# Patient Record
Sex: Male | Born: 1945 | Race: Black or African American | Hispanic: No | Marital: Married | Smoking: Never smoker
Health system: Southern US, Community
[De-identification: ages and names within clinical notes are randomized; demographics above are authoritative.]

## PROBLEM LIST (undated history)

## (undated) DIAGNOSIS — E7800 Pure hypercholesterolemia, unspecified: Secondary | ICD-10-CM

## (undated) DIAGNOSIS — N184 Chronic kidney disease, stage 4 (severe): Secondary | ICD-10-CM

## (undated) DIAGNOSIS — D649 Anemia, unspecified: Secondary | ICD-10-CM

## (undated) DIAGNOSIS — I10 Essential (primary) hypertension: Secondary | ICD-10-CM

## (undated) DIAGNOSIS — K219 Gastro-esophageal reflux disease without esophagitis: Secondary | ICD-10-CM

## (undated) DIAGNOSIS — E119 Type 2 diabetes mellitus without complications: Secondary | ICD-10-CM

## (undated) HISTORY — PX: HERNIA REPAIR: SHX51

---

## 2004-04-11 ENCOUNTER — Ambulatory Visit (HOSPITAL_COMMUNITY): Admission: RE | Admit: 2004-04-11 | Discharge: 2004-04-11 | Payer: Self-pay | Admitting: General Surgery

## 2004-07-11 ENCOUNTER — Ambulatory Visit (HOSPITAL_COMMUNITY): Admission: RE | Admit: 2004-07-11 | Discharge: 2004-07-11 | Payer: Self-pay | Admitting: Family Medicine

## 2004-07-21 ENCOUNTER — Ambulatory Visit (HOSPITAL_COMMUNITY): Admission: RE | Admit: 2004-07-21 | Discharge: 2004-07-21 | Payer: Self-pay | Admitting: Family Medicine

## 2006-07-31 ENCOUNTER — Ambulatory Visit (HOSPITAL_COMMUNITY): Admission: RE | Admit: 2006-07-31 | Discharge: 2006-07-31 | Payer: Self-pay | Admitting: Family Medicine

## 2008-03-23 ENCOUNTER — Emergency Department (HOSPITAL_COMMUNITY): Admission: EM | Admit: 2008-03-23 | Discharge: 2008-03-23 | Payer: Self-pay | Admitting: Emergency Medicine

## 2011-03-17 NOTE — H&P (Signed)
NAMESAISH, Omar Parker                              ACCOUNT NO.:  [id number]   MEDICAL RECORD NO.:  [id number]                  PATIENT TYPE:   LOCATION:                                       FACILITY:   PHYSICIAN:  Jamesetta So, M.D.               DATE OF BIRTH:  [birth year]   DATE OF ADMISSION:  DATE OF DISCHARGE:                                HISTORY & PHYSICAL   CHIEF COMPLAINT:  Recurrent left inguinal hernia.   HISTORY OF PRESENT ILLNESS:  The patient is a 65 year old black male who is  referred for evaluation and treatment of a recurrent left inguinal hernia.  He had bilateral inguinal herniorrhaphies in the remote past.  He had a left  groin mass start some time ago, but is increasing in size and is causing  discomfort.  No nausea or vomiting had been noted.   PAST MEDICAL HISTORY:  1. Non-insulin-dependent diabetes mellitus.  2. Hypertension.   PAST SURGICAL HISTORY:  As noted above.   CURRENT MEDICATIONS:  Norvasc and Glucophage.   ALLERGIES:  No known drug allergies.   REVIEW OF SYSTEMS:  The patient denies drinking or smoking.  He denies any  other cardiopulmonary difficulties or bleeding disorders.   PHYSICAL EXAMINATION:  GENERAL:  The patient is a well-developed, well-  nourished black male in no acute distress.  VITAL SIGNS:  He is afebrile and vital signs are stable.  LUNGS:  Clear to auscultation with equal breath sounds bilaterally.  HEART:  Regular rate and rhythm without S3, S4 or murmurs.  ABDOMEN:  Soft, nontender and nondistended.  No hepatosplenomegaly or masses  are noted.  A large left inguinal hernia is noted extending into the  scrotum, which was somewhat reducible.  GENITOURINARY:  Within normal limits.   IMPRESSION:  Recurrent left inguinal hernia.   PLAN:  The patient is scheduled for a recurrent left inguinal herniorrhaphy  on April 11, 2004.  The risks and benefits of the procedure including  bleeding, infection, swelling, numbness and the  possibility of recurrence of  the hernia were fully explained to the patient; gave informed consent.     ___________________________________________                                         Jamesetta So, M.D.   MAJ/MEDQ  D:  03/31/2004  T:  03/31/2004  Job:  [id number]   cc:   Jamesetta So, M.D.  [address] [place]  [place] [address]  Fax: [phone]   Angus G. Everette Rank, M.D.  [address]  [place]  [place] [address]  Fax: [phone]

## 2011-03-17 NOTE — Op Note (Signed)
NAME:  Omar Parker, Omar Parker                            ACCOUNT NO.:  [id number]   MEDICAL RECORD NO.:  [id number]                   PATIENT TYPE:  AMB   LOCATION:  DAY                                  FACILITY:  [facility]   PHYSICIAN:  Jamesetta So, M.D.               DATE OF BIRTH:  1946-04-05   DATE OF PROCEDURE:  04/11/2004  DATE OF DISCHARGE:                                 OPERATIVE REPORT   PREOPERATIVE DIAGNOSIS:  Recurrent left inguinal hernia.   POSTOPERATIVE DIAGNOSIS:  Recurrent left inguinal hernia with hydrocele.   PROCEDURE:  Left inguinal herniorrhaphy with hydrocelectomy, recurrent.   SURGEON:  Jamesetta So, M.D.   ANESTHESIA:  General endotracheal.   INDICATIONS:  The patient is a 65 year old black male who is referred for  evaluation and treatment of a recurrent left inguinal hernia.  He has had  bilateral inguinal herniorrhaphies in the remote past.  The risks and  benefits of the procedure including bleeding, infection, pain, and  recurrence of the hernia were fully explained to the patient, who gave  informed consent.   DESCRIPTION OF PROCEDURE:  The patient was placed in the supine position.  After induction of general anesthesia, the left groin region was prepped and  draped using the usual sterile technique with Betadine.  Surgical site  confirmation was performed.   An left groin incision was made down to the hernia sac. The patient had a  large hernia which could not be fully reduced.  The spermatocord was freed  away from the hernia sac.  The vas deferens was never fully identified.  I  suspect that it may have been divided in the past.  The hernia sac was  incised at its peritoneal reflection and omentum was found.  Due to the  inability to fully reduce the omentum that was out, an LDS stapler was used  to do a partial omentectomy.  The omentum then was reduced back into the  abdominal cavity.  It appeared that the hernia was just lateral to the  spermatocord. It was through the previous mesh that had been placed.  It  appeared that the lateral aspect of the internal ring where the mesh was had  opened.  The mesh was reattached using 2-0 Novofil interrupted sutures in a  2-layer fashion in order to recreate the internal ring.   Next, the left testicle was brought out through the wound and a left  hydrocelectomy was performed.  The left testicle was then reduced back into  the scrotum.  The external oblique was not noted to be significantly intact.  The remaining subcutaneous tissue was reapproximated using 2-0 Novofil  interrupted sutures.   Sensorcaine 0.5% was instilled into the surrounding wound.  The incision was  closed with staples.  Betadine ointment and dry sterile dressing were  applied.   All tape and needle counts were correct at the  end of the procedure.  The  patient was extubated in the operating room and went back to recovery room  awake, in stable condition.   COMPLICATIONS:  None.   SPECIMENS:  Left inguinal hernia sac, hydrocele.   BLOOD LOSS:  Minimal.      ___________________________________________                                            Jamesetta So, M.D.   MAJ/MEDQ  D:  04/11/2004  T:  04/11/2004  Job:  [id number]   cc:   Jamesetta So, M.D.  [address], [place]  [place] [address]  Fax: [phone]   Angus G. Everette Rank, M.D.  [address]  [place]  [place] [address]  Fax: [phone]

## 2011-07-26 LAB — CBC
HCT: 40.3
Hemoglobin: 13.6
MCHC: 33.8
MCV: 79.5
Platelets: 347
RBC: 5.07
RDW: 15
WBC: 6.8

## 2011-07-26 LAB — BASIC METABOLIC PANEL
BUN: 12
CO2: 24
Chloride: 105
Creatinine, Ser: 1.16
GFR calc Af Amer: >60

## 2011-07-26 LAB — BASIC METABOLIC PANEL WITH GFR
Calcium: 9
GFR calc non Af Amer: >60
Glucose, Bld: 158 — ABNORMAL HIGH
Potassium: 3.8
Sodium: 136

## 2011-07-26 LAB — DIFFERENTIAL
Basophils Absolute: 0.1
Basophils Relative: 1
Eosinophils Absolute: 0.1
Eosinophils Relative: 2
Lymphocytes Relative: 24
Lymphs Abs: 1.6
Monocytes Absolute: 0.5
Monocytes Relative: 7
Neutro Abs: 4.5
Neutrophils Relative %: 67

## 2011-07-26 LAB — POCT CARDIAC MARKERS
CKMB, poc: 2.2
Myoglobin, poc: 83.1
Operator id: 221061
Troponin i, poc: <0.05

## 2013-06-14 ENCOUNTER — Emergency Department (HOSPITAL_COMMUNITY)
Admission: EM | Admit: 2013-06-14 | Discharge: 2013-06-14 | Disposition: A | Discharge status: Home / Self Care | Payer: BC Managed Care – PPO | Attending: Emergency Medicine | Admitting: Emergency Medicine

## 2013-06-14 ENCOUNTER — Encounter (HOSPITAL_COMMUNITY): Payer: Self-pay | Admitting: *Deleted

## 2013-06-14 DIAGNOSIS — E1169 Type 2 diabetes mellitus with other specified complication: Secondary | ICD-10-CM | POA: Insufficient documentation

## 2013-06-14 DIAGNOSIS — R739 Hyperglycemia, unspecified: Secondary | ICD-10-CM

## 2013-06-14 DIAGNOSIS — R3589 Other polyuria: Secondary | ICD-10-CM | POA: Insufficient documentation

## 2013-06-14 DIAGNOSIS — R358 Other polyuria: Secondary | ICD-10-CM | POA: Insufficient documentation

## 2013-06-14 DIAGNOSIS — Z79899 Other long term (current) drug therapy: Secondary | ICD-10-CM | POA: Insufficient documentation

## 2013-06-14 DIAGNOSIS — E7800 Pure hypercholesterolemia, unspecified: Secondary | ICD-10-CM | POA: Insufficient documentation

## 2013-06-14 DIAGNOSIS — Z794 Long term (current) use of insulin: Secondary | ICD-10-CM | POA: Insufficient documentation

## 2013-06-14 DIAGNOSIS — I10 Essential (primary) hypertension: Secondary | ICD-10-CM | POA: Insufficient documentation

## 2013-06-14 DIAGNOSIS — R631 Polydipsia: Secondary | ICD-10-CM | POA: Insufficient documentation

## 2013-06-14 DIAGNOSIS — Z7982 Long term (current) use of aspirin: Secondary | ICD-10-CM | POA: Insufficient documentation

## 2013-06-14 DIAGNOSIS — R350 Frequency of micturition: Secondary | ICD-10-CM | POA: Insufficient documentation

## 2013-06-14 DIAGNOSIS — R42 Dizziness and giddiness: Secondary | ICD-10-CM | POA: Insufficient documentation

## 2013-06-14 HISTORY — DX: Essential (primary) hypertension: I10

## 2013-06-14 HISTORY — DX: Pure hypercholesterolemia, unspecified: E78.00

## 2013-06-14 HISTORY — DX: Type 2 diabetes mellitus without complications: E11.9

## 2013-06-14 LAB — GLUCOSE, CAPILLARY: Glucose-Capillary: 292 mg/dL — ABNORMAL HIGH (ref 70–99)

## 2013-06-14 LAB — CBC
HCT: 37.9 % — ABNORMAL LOW (ref 39.0–52.0)
Hemoglobin: 12.9 g/dL — ABNORMAL LOW (ref 13.0–17.0)
MCH: 26.4 pg (ref 26.0–34.0)
MCV: 77.7 fL — ABNORMAL LOW (ref 78.0–100.0)
RBC: 4.88 MIL/uL (ref 4.22–5.81)

## 2013-06-14 LAB — BASIC METABOLIC PANEL
BUN: 29 mg/dL — ABNORMAL HIGH (ref 6–23)
CO2: 23 mEq/L (ref 19–32)
Calcium: 9.3 mg/dL (ref 8.4–10.5)
Creatinine, Ser: 1.84 mg/dL — ABNORMAL HIGH (ref 0.50–1.35)
Glucose, Bld: 409 mg/dL — ABNORMAL HIGH (ref 70–99)

## 2013-06-14 MED ORDER — INSULIN ASPART 100 UNIT/ML ~~LOC~~ SOLN
10.0000 [IU] | Freq: Once | SUBCUTANEOUS | Status: AC
  Administered 2013-06-14: 10 [IU] via INTRAVENOUS
  Filled 2013-06-14: qty 1

## 2013-06-14 MED ORDER — SODIUM CHLORIDE 0.9 % IV SOLN
Freq: Once | INTRAVENOUS | Status: AC
  Administered 2013-06-14: 999 mL via INTRAVENOUS

## 2013-06-14 MED ORDER — SODIUM CHLORIDE 0.9 % IV SOLN
INTRAVENOUS | Status: DC
  Administered 2013-06-14: 1000 mL via INTRAVENOUS

## 2013-06-14 NOTE — ED Notes (Signed)
C/o increased fatigue and being thirsty x 2 days.  Denies weakness, denies pain.

## 2013-06-14 NOTE — ED Provider Notes (Signed)
CSN: [id number]     Arrival date & time 06/14/13  1000 History    This chart was scribed for Omar Kung, MD,  by Stacy Gardner, ED Scribe. The patient was seen in room APA02/[facility]02 and the patient's care was started at 11:54 AM  First MD Initiated Contact with Patient 06/14/13 1109     Chief Complaint  Patient presents with  . Fatigue   (Consider location/radiation/quality/duration/timing/severity/associated sxs/prior Treatment) HPI HPI Comments: Omar Parker is a 67 y.o. male who presents to the Emergency Department complaining of fatigue. Pt mention feeling dizzy and generalized weakness as associated symptoms. Pt also reports polyuria and polydipsia over the past two days. Pt had increased blood sugar level in 400's. He was given fluids in the ED and the symptoms have mostly resolved.  Pt denies having a shortage of medications. Pt mentions that he takes his insulin around 5 AM however he reports that he attributes symptoms to poor dieting.   However he denies vomiting, focal weakness and pain.   Pt has a hx of diabetes, HTN, high cholesterol Pt PCP is Dr. Orson Ape but also seen at [facility].  Past Medical History  Diagnosis Date  . Diabetes mellitus without complication   . Hypertension   . High cholesterol    Past Surgical History  Procedure Laterality Date  . Hernia repair     No family history on file. History  Substance Use Topics  . Smoking status: Never Smoker   . Smokeless tobacco: Not on file  . Alcohol Use: No    Review of Systems  Constitutional: Positive for fatigue. Negative for fever and chills.  HENT: Negative for congestion, sore throat, rhinorrhea and neck pain.   Respiratory: Negative for cough and shortness of breath.   Cardiovascular: Negative for chest pain.  Gastrointestinal: Negative for nausea, vomiting, abdominal pain and diarrhea.  Endocrine: Positive for polydipsia and polyuria.  Genitourinary: Positive for frequency.  Musculoskeletal:  Negative for back pain and joint swelling.  Skin: Negative for rash.  Neurological: Positive for dizziness and weakness. Negative for headaches.  Hematological: Does not bruise/bleed easily.  Psychiatric/Behavioral: Negative for confusion.  All other systems reviewed and are negative.    Allergies  Review of patient's allergies indicates no known allergies.  Home Medications   Current Outpatient Rx  Name  Route  Sig  Dispense  Refill  . aspirin 81 MG chewable tablet   Oral   Chew 81 mg by mouth daily.         Marland Kitchen glyBURIDE (DIABETA) 5 MG tablet   Oral   Take 10 mg by mouth 2 (two) times daily with a meal.         . insulin glargine (LANTUS) 100 UNIT/ML injection   Subcutaneous   Inject 50 Units into the skin daily.         Marland Kitchen lisinopril-hydrochlorothiazide (PRINZIDE,ZESTORETIC) 20-12.5 MG per tablet   Oral   Take 2 tablets by mouth daily.         . pravastatin (PRAVACHOL) 20 MG tablet   Oral   Take 10 mg by mouth daily.         . sildenafil (VIAGRA) 100 MG tablet   Oral   Take 100 mg by mouth daily as needed for erectile dysfunction.          BP 134/78  Pulse 99  Temp(Src) 98.2 F (36.8 C) (Oral)  Resp 16  Ht 5\' 11"  (1.803 m)  Wt 190 lb (86.183 kg)  BMI 26.51 kg/m2  SpO2 100% Physical Exam  Nursing note and vitals reviewed. Constitutional: He is oriented to person, place, and time. He appears well-developed and well-nourished. No distress.  HENT:  Head: Normocephalic and atraumatic.  Mouth/Throat: Oropharynx is clear and moist.  Mucus membrane moist   Eyes: Conjunctivae are normal. Pupils are equal, round, and reactive to light. No scleral icterus.  Neck: Neck supple.  Cardiovascular: Normal rate, regular rhythm, normal heart sounds and intact distal pulses.   No murmur heard. Pulmonary/Chest: Effort normal and breath sounds normal. No stridor. No respiratory distress. He has no wheezes. He has no rales.  Abdominal: Soft. He exhibits no  distension. There is no tenderness. There is no rebound.  Musculoskeletal: Normal range of motion. He exhibits no edema.  No ankle swelling  Neurological: He is alert and oriented to person, place, and time. No cranial nerve deficit. Coordination normal.  Skin: Skin is warm and dry. No rash noted.  Psychiatric: He has a normal mood and affect. His behavior is normal.    ED Course  DIAGNOSTIC STUDIES: Oxygen Saturation is 100% on room air, normal by my interpretation.    COORDINATION OF CARE: 11:59 Discussed course of care with pt . Pt understands and agrees.   Procedures (including critical care time)  Labs Reviewed  CBC - Abnormal; Notable for the following:    Hemoglobin 12.9 (*)    HCT 37.9 (*)    MCV 77.7 (*)    All other components within normal limits  BASIC METABOLIC PANEL - Abnormal; Notable for the following:    Sodium 131 (*)    Glucose, Bld 409 (*)    BUN 29 (*)    Creatinine, Ser 1.84 (*)    GFR calc non Af Amer 37 (*)    GFR calc Af Amer 43 (*)    All other components within normal limits  GLUCOSE, CAPILLARY - Abnormal; Notable for the following:    Glucose-Capillary 371 (*)    All other components within normal limits  GLUCOSE, CAPILLARY - Abnormal; Notable for the following:    Glucose-Capillary 367 (*)    All other components within normal limits  GLUCOSE, CAPILLARY - Abnormal; Notable for the following:    Glucose-Capillary 292 (*)    All other components within normal limits   No results found.  Results for orders placed during the hospital encounter of 06/14/13  CBC      Result Value Range   WBC 6.6  4.0 - 10.5 K/uL   RBC 4.88  4.22 - 5.81 MIL/uL   Hemoglobin 12.9 (*) 13.0 - 17.0 g/dL   HCT 37.9 (*) 39.0 - 52.0 %   MCV 77.7 (*) 78.0 - 100.0 fL   MCH 26.4  26.0 - 34.0 pg   MCHC 34.0  30.0 - 36.0 g/dL   RDW 13.7  11.5 - 15.5 %   Platelets 280  150 - 400 K/uL  BASIC METABOLIC PANEL      Result Value Range   Sodium 131 (*) 135 - 145 mEq/L    Potassium 3.6  3.5 - 5.1 mEq/L   Chloride 96  96 - 112 mEq/L   CO2 23  19 - 32 mEq/L   Glucose, Bld 409 (*) 70 - 99 mg/dL   BUN 29 (*) 6 - 23 mg/dL   Creatinine, Ser 1.84 (*) 0.50 - 1.35 mg/dL   Calcium 9.3  8.4 - 10.5 mg/dL   GFR calc non Af Amer 37 (*) >90  mL/min   GFR calc Af Amer 43 (*) >90 mL/min  GLUCOSE, CAPILLARY      Result Value Range   Glucose-Capillary 371 (*) 70 - 99 mg/dL   Comment 1 Documented in Chart    GLUCOSE, CAPILLARY      Result Value Range   Glucose-Capillary 367 (*) 70 - 99 mg/dL   Comment 1 Documented in Chart    GLUCOSE, CAPILLARY      Result Value Range   Glucose-Capillary 292 (*) 70 - 99 mg/dL    1. Hyperglycemia     MDM   Patient with known history of diabetes patient has been taking his diabetic meds. He does admit these didn't very poor with his diet. Patient said increased thirst increased urination for several days probably due to elevated blood sugars. No evidence of acidosis. Patient is nontoxic no acute distress. Fluids help to improve the blood sugar gave also 10 units of regular insulin which improved to down below 300. Patient will go back with a more careful diet and followup with his regular Dr.   I personally performed the services described in this documentation, which was scribed in my presence. The recorded information has been reviewed and is accurate.     Omar Kung, MD 06/14/13 [phone]

## 2015-02-20 ENCOUNTER — Encounter (HOSPITAL_COMMUNITY): Payer: Self-pay | Admitting: Emergency Medicine

## 2015-02-20 ENCOUNTER — Emergency Department (HOSPITAL_COMMUNITY): Payer: BLUE CROSS/BLUE SHIELD

## 2015-02-20 ENCOUNTER — Emergency Department (HOSPITAL_COMMUNITY)
Admission: EM | Admit: 2015-02-20 | Discharge: 2015-02-20 | Disposition: A | Discharge status: Home / Self Care | Payer: BLUE CROSS/BLUE SHIELD | Attending: Emergency Medicine | Admitting: Emergency Medicine

## 2015-02-20 DIAGNOSIS — Z7982 Long term (current) use of aspirin: Secondary | ICD-10-CM | POA: Insufficient documentation

## 2015-02-20 DIAGNOSIS — E119 Type 2 diabetes mellitus without complications: Secondary | ICD-10-CM | POA: Diagnosis not present

## 2015-02-20 DIAGNOSIS — I10 Essential (primary) hypertension: Secondary | ICD-10-CM | POA: Insufficient documentation

## 2015-02-20 DIAGNOSIS — E780 Pure hypercholesterolemia: Secondary | ICD-10-CM | POA: Insufficient documentation

## 2015-02-20 DIAGNOSIS — R609 Edema, unspecified: Secondary | ICD-10-CM

## 2015-02-20 DIAGNOSIS — Z794 Long term (current) use of insulin: Secondary | ICD-10-CM | POA: Diagnosis not present

## 2015-02-20 DIAGNOSIS — M79672 Pain in left foot: Secondary | ICD-10-CM | POA: Diagnosis present

## 2015-02-20 DIAGNOSIS — R590 Localized enlarged lymph nodes: Secondary | ICD-10-CM | POA: Diagnosis not present

## 2015-02-20 DIAGNOSIS — Z79899 Other long term (current) drug therapy: Secondary | ICD-10-CM | POA: Insufficient documentation

## 2015-02-20 DIAGNOSIS — M7989 Other specified soft tissue disorders: Secondary | ICD-10-CM

## 2015-02-20 LAB — CBC
HEMATOCRIT: 39.6 % (ref 39.0–52.0)
Hemoglobin: 12.8 g/dL — ABNORMAL LOW (ref 13.0–17.0)
MCH: 25.2 pg — AB (ref 26.0–34.0)
MCHC: 32.3 g/dL (ref 30.0–36.0)
MCV: 78.1 fL (ref 78.0–100.0)
Platelets: 274 10*3/uL (ref 150–400)
RBC: 5.07 MIL/uL (ref 4.22–5.81)
RDW: 15 % (ref 11.5–15.5)
WBC: 7.2 10*3/uL (ref 4.0–10.5)

## 2015-02-20 LAB — I-STAT CHEM 8, ED
BUN: 29 mg/dL — AB (ref 6–23)
Calcium, Ion: 1.14 mmol/L (ref 1.13–1.30)
Chloride: 106 mmol/L (ref 96–112)
Creatinine, Ser: 1.8 mg/dL — ABNORMAL HIGH (ref 0.50–1.35)
Glucose, Bld: 118 mg/dL — ABNORMAL HIGH (ref 70–99)
HEMATOCRIT: 43 % (ref 39.0–52.0)
Hemoglobin: 14.6 g/dL (ref 13.0–17.0)
Potassium: 4 mmol/L (ref 3.5–5.1)
Sodium: 139 mmol/L (ref 135–145)
TCO2: 20 mmol/L (ref 0–100)

## 2015-02-20 MED ORDER — NAPROXEN 500 MG PO TABS: 500.0000 mg | ORAL_TABLET | Freq: Two times a day (BID) | ORAL | Status: DC

## 2015-02-20 MED ORDER — IOHEXOL 300 MG/ML  SOLN
50.0000 mL | Freq: Once | INTRAMUSCULAR | Status: AC | PRN
  Administered 2015-02-20: 50 mL via ORAL

## 2015-02-20 MED ORDER — IOHEXOL 300 MG/ML  SOLN
80.0000 mL | Freq: Once | INTRAMUSCULAR | Status: AC | PRN
  Administered 2015-02-20: 80 mL via INTRAVENOUS

## 2015-02-20 NOTE — ED Notes (Signed)
US at bedside

## 2015-02-20 NOTE — Discharge Instructions (Signed)
Edema °Edema is an abnormal buildup of fluids in your body tissues. Edema is somewhat dependent on gravity to pull the fluid to the lowest place in your body. That makes the condition more common in the legs and thighs (lower extremities). Painless swelling of the feet and ankles is common and becomes more likely as you get older. It is also common in looser tissues, like around your eyes.  °When the affected area is squeezed, the fluid may move out of that spot and leave a dent for a few moments. This dent is called pitting.  °CAUSES  °There are many possible causes of edema. Eating too much salt and being on your feet or sitting for a long time can cause edema in your legs and ankles. Hot weather may make edema worse. Common medical causes of edema include: °· Heart failure. °· Liver disease. °· Kidney disease. °· Weak blood vessels in your legs. °· Cancer. °· An injury. °· Pregnancy. °· Some medications. °· Obesity.  °SYMPTOMS  °Edema is usually painless. Your skin may look swollen or shiny.  °DIAGNOSIS  °Your health care provider may be able to diagnose edema by asking about your medical history and doing a physical exam. You may need to have tests such as X-rays, an electrocardiogram, or blood tests to check for medical conditions that may cause edema.  °TREATMENT  °Edema treatment depends on the cause. If you have heart, liver, or kidney disease, you need the treatment appropriate for these conditions. General treatment may include: °· Elevation of the affected body part above the level of your heart. °· Compression of the affected body part. Pressure from elastic bandages or support stockings squeezes the tissues and forces fluid back into the blood vessels. This keeps fluid from entering the tissues. °· Restriction of fluid and salt intake. °· Use of a water pill (diuretic). These medications are appropriate only for some types of edema. They pull fluid out of your body and make you urinate more often. This  gets rid of fluid and reduces swelling, but diuretics can have side effects. Only use diuretics as directed by your health care provider. °HOME CARE INSTRUCTIONS  °· Keep the affected body part above the level of your heart when you are lying down.   °· Do not sit still or stand for prolonged periods.   °· Do not put anything directly under your knees when lying down. °· Do not wear constricting clothing or garters on your upper legs.   °· Exercise your legs to work the fluid back into your blood vessels. This may help the swelling go down.   °· Wear elastic bandages or support stockings to reduce ankle swelling as directed by your health care provider.   °· Eat a low-salt diet to reduce fluid if your health care provider recommends it.   °· Only take medicines as directed by your health care provider.  °SEEK MEDICAL CARE IF:  °· Your edema is not responding to treatment. °· You have heart, liver, or kidney disease and notice symptoms of edema. °· You have edema in your legs that does not improve after elevating them.   °· You have sudden and unexplained weight gain. °SEEK IMMEDIATE MEDICAL CARE IF:  °· You develop shortness of breath or chest pain.   °· You cannot breathe when you lie down. °· You develop pain, redness, or warmth in the swollen areas.   °· You have heart, liver, or kidney disease and suddenly get edema. °· You have a fever and your symptoms suddenly get worse. °MAKE SURE YOU:  °·   Understand these instructions.  Will watch your condition.  Will get help right away if you are not doing well or get worse. Document Released: 10/16/2005 Document Revised: 03/02/2014 Document Reviewed: 08/08/2013 [facility] Patient Information 2015 [place], [place]. This information is not intended to replace advice given to you by your health care provider. Make sure you discuss any questions you have with your health care provider.   Keep your foot elevated as much as possible - if it continues to swell, you will  likely need to be seen for recheck and may need compression stockings.    You MUST have a recheck - you will need to get further testing on the lymph node in your groin - they may need to do a biopsy.  [facility] Primary Care Doctor List    Sinda Du MD. Specialty: Pulmonary Disease Contact information: [place]  [place] [address]  [phone]   Tula Nakayama, MD. Specialty: [facility] Medicine Contact information: [address], [address] [place] [address]  [phone]   Sallee Lange, MD. Specialty: [facility] Medicine Contact information: [place]  [place] [address]  [phone]   Rosita Fire, MD Specialty: Internal Medicine Contact information: [place] [address]  [phone]   Delphina Cahill, MD. Specialty: Internal Medicine Contact information: [place] [address]  [phone]   Marjean Donna, MD. Specialty: Family Medicine Contact information: [place] [address]  [phone]   Leslie Andrea, MD. Specialty: [facility] Medicine Contact information: [place] [address]  [phone]   Asencion Noble, MD. Specialty: Internal Medicine Contact information: [place]  [place] [address]  [phone]

## 2015-02-20 NOTE — ED Provider Notes (Signed)
CSN: [id number]     Arrival date & time 02/20/15  1011 History   First MD Initiated Contact with Patient 02/20/15 1017     Chief Complaint  Patient presents with  . Foot Pain     (Consider location/radiation/quality/duration/timing/severity/associated sxs/prior Treatment) Patient is a 69 y.o. male presenting with lower extremity pain. The history is provided by the patient. No language interpreter was used.  Foot Pain Pertinent negatives include no chest pain and no shortness of breath.   This chart was scribed for Noemi Chapel, MD by Thea Alken, ED Scribe. This patient was seen in room APA18/APA18 and the patient's care was started at 3:28 PM.  HPI Comments:  Omar Parker is a 69 y.o. male who present to the Emergency Department complaining of left leg swelling that began yesterday., has been persistsent, mild.  Pt denies injuries and falls. He reports feeling pressure in foot when walking due to swelling. Pt has hx HTN and DM which he states is under control. Pt denies fever, SOB and CP. He denies hx of blood clot, surgeries, and recent travels.  He denies swelling in left leg in the past. No risk factors for  PE.  Past Medical History  Diagnosis Date  . Diabetes mellitus without complication   . Hypertension   . High cholesterol    Past Surgical History  Procedure Laterality Date  . Hernia repair     No family history on file. History  Substance Use Topics  . Smoking status: Never Smoker   . Smokeless tobacco: Not on file  . Alcohol Use: No    Review of Systems  Constitutional: Negative for fever.  Respiratory: Negative for shortness of breath.   Cardiovascular: Positive for leg swelling. Negative for chest pain.  All other systems reviewed and are negative.     Allergies  Review of patient's allergies indicates no known allergies.  Home Medications   Prior to Admission medications   Medication Sig Start Date End Date Taking? Authorizing Provider  amLODipine  (NORVASC) 10 MG tablet Take 10 mg by mouth daily.   Yes Historical Provider, MD  amLODipine (NORVASC) 5 MG tablet Take 5 mg by mouth daily.   Yes Historical Provider, MD  aspirin 81 MG chewable tablet Chew 81 mg by mouth daily.   Yes Historical Provider, MD  glipiZIDE (GLUCOTROL) 10 MG tablet Take 10 mg by mouth daily before breakfast.   Yes Historical Provider, MD  hydrochlorothiazide (HYDRODIURIL) 25 MG tablet Take 25 mg by mouth daily.   Yes Historical Provider, MD  insulin glargine (LANTUS) 100 UNIT/ML injection Inject 50 Units into the skin daily.   Yes Historical Provider, MD  lisinopril (PRINIVIL,ZESTRIL) 10 MG tablet Take 10 mg by mouth daily.   Yes Historical Provider, MD  pravastatin (PRAVACHOL) 20 MG tablet Take 10 mg by mouth daily.   Yes Historical Provider, MD  sildenafil (VIAGRA) 100 MG tablet Take 100 mg by mouth daily as needed for erectile dysfunction.   Yes Historical Provider, MD  naproxen (NAPROSYN) 500 MG tablet Take 1 tablet (500 mg total) by mouth 2 (two) times daily with a meal. 02/20/15   Noemi Chapel, MD   BP 141/80 mmHg  Pulse 75  Temp(Src) 97.5 F (36.4 C) (Oral)  Resp 16  Ht 5\' 11"  (1.803 m)  Wt 197 lb (89.359 kg)  BMI 27.49 kg/m2  SpO2 100% Physical Exam  Constitutional: He appears well-developed and well-nourished.  HENT:  Head: Normocephalic and atraumatic.  Eyes: Conjunctivae  are normal. Right eye exhibits no discharge. Left eye exhibits no discharge.  Cardiovascular: Normal rate, regular rhythm and normal heart sounds.  Exam reveals no gallop and no friction rub.   No murmur heard. Pulmonary/Chest: Effort normal and breath sounds normal. No respiratory distress. He has no wheezes. He has no rales. He exhibits no tenderness.  Musculoskeletal: He exhibits edema ( from knee to foot pedal edema uni lateral to the foot. ).  Lateral left leg swelling knee to foot.  No tenderness with ROM of joint. Soft compartments. Negative Holmans sign.   Neurological: He  is alert. Coordination normal.  Skin: Skin is warm and dry. No rash noted. He is not diaphoretic. No erythema.  Psychiatric: He has a normal mood and affect.  Nursing note and vitals reviewed.   ED Course  Procedures (including critical care time) Labs Review Labs Reviewed  CBC - Abnormal; Notable for the following:    Hemoglobin 12.8 (*)    MCH 25.2 (*)    All other components within normal limits  I-STAT CHEM 8, ED - Abnormal; Notable for the following:    BUN 29 (*)    Creatinine, Ser 1.80 (*)    Glucose, Bld 118 (*)    All other components within normal limits    Imaging Review Ct Abdomen Pelvis W Contrast  02/20/2015   CLINICAL DATA:  LL leg swelling since yesterday. Left inguinal lymph node seen on ultrasound today. Pt is diabetic,HTN, hernia repair.  EXAM: CT ABDOMEN AND PELVIS WITH CONTRAST  TECHNIQUE: Multidetector CT imaging of the abdomen and pelvis was performed using the standard protocol following bolus administration of intravenous contrast.  CONTRAST:  20mL OMNIPAQUE IOHEXOL 300 MG/ML SOLN, 74mL OMNIPAQUE IOHEXOL 300 MG/ML SOLN  COMPARISON:  None.  FINDINGS: Clear lung bases.  Heart normal in size.  Liver, spleen, gallbladder, pancreas, adrenal glands:  Normal.  13 mm upper pole cyst from the right kidney. 15 mm renal sinus cyst on the left at the midpole. No other renal abnormalities. No hydronephrosis. Normal ureters. Normal bladder.  No pathologically enlarged lymph nodes. No abnormal fluid collections.  Numerous colonic diverticula mostly along the sigmoid colon. No diverticulitis. No bowel wall thickening or mesenteric inflammation. Normal small bowel. Normal appendix.  Mild disc degenerative changes at L2-L3. No osteoblastic or osteolytic lesions.  Mild scattered atherosclerotic plaque in the abdominal aorta and iliac arteries. Inferior vena cava and iliac veins appear patent.  IMPRESSION: 1. No acute findings within the abdomen pelvis. 2. No pelvic masses. 3. 15 mm short  axis presumed lymph node is partly imaged lying in the medial left inguinal region chest lateral to the inferior aspect of the inguinal canal and spermatic cord. No other evidence of inguinal adenopathy. 4. No venous abnormality evident to explain left leg swelling. 5. Small renal cysts.  Extensive sigmoid colon diverticulosis.   Electronically Signed   By: Lajean Manes M.D.   On: 02/20/2015 15:21   US Venous Img Lower Unilateral Left  02/20/2015   CLINICAL DATA:  Acute onset of left lower extremity edema yesterday from the knee down to the foot.  EXAM: LEFT LOWER EXTREMITY VENOUS DOPPLER ULTRASOUND  TECHNIQUE: Gray-scale sonography with graded compression, as well as color Doppler and duplex ultrasound were performed to evaluate the lower extremity deep venous systems from the level of the common femoral vein and including the common femoral, femoral, profunda femoral, popliteal and calf veins including the posterior tibial, peroneal and gastrocnemius veins when visible. The superficial  great saphenous vein was also interrogated. Spectral Doppler was utilized to evaluate flow at rest and with distal augmentation maneuvers in the common femoral, femoral and popliteal veins.  COMPARISON:  07/31/2006.  FINDINGS: Contralateral Common Femoral Vein: Respiratory phasicity is normal and symmetric with the symptomatic side. No evidence of thrombus. Normal compressibility.  Common Femoral Vein: No evidence of thrombus. Normal compressibility, respiratory phasicity and response to augmentation.  Saphenofemoral Junction: No evidence of thrombus. Normal compressibility and flow on color Doppler imaging.  Profunda Femoral Vein: No evidence of thrombus. Normal compressibility and flow on color Doppler imaging.  Femoral Vein: No evidence of thrombus. Normal compressibility, respiratory phasicity and response to augmentation.  Popliteal Vein: No evidence of thrombus. Normal compressibility, respiratory phasicity and response to  augmentation.  Calf Veins: No evidence of thrombus. Normal compressibility and flow on color Doppler imaging.  Superficial Great Saphenous Vein: No evidence of thrombus. Normal compressibility and flow on color Doppler imaging.  Venous Reflux:  Not evaluated.  Other Findings: Pathologic left superficial inguinal lymph node measuring approximately 2.3 x 1.3 x 2.1 cm with loss of the normal fatty hilum. Two adjacent normal appearing superficial inguinal lymph nodes.  IMPRESSION: 1. No evidence of left lower extremity DVT. 2. Pathologic left superficial inguinal lymph node measuring approximately 2.3 cm with adjacent normal-appearing lymph nodes.   Electronically Signed   By: Evangeline Dakin M.D.   On: 02/20/2015 12:20      MDM   Final diagnoses:  Swelling  Inguinal lymphadenopathy  Swelling of lower extremity    There is concern for DVT given the patient's feeling of fullness and objective swelling on exam, vital signs are unremarkable, he has no chest pain or shortness of breath, Doppler ultrasound of the lower extremity has been ordered to rule out DVT. No signs of rash, cellulitis, fracture.  On secondary exam, pt has an enlarged LN in the L groin - [place] report states "pathological" in their report - the VS are still normal - he feels well an has been ambulating - no DVT seen.  CT ordered to r/o intraabdominal or pelvic pathology.  Lymph node visualized on CT scan, no other pelvic findings of concern, the patient has been informed of these results, I have recommended close follow-up for further evaluation and possible biopsy, he will be given anti-inflammatories for his foot pain, encouraged to keep his leg elevated for swelling, stable for discharge  Meds given in ED:  Medications  iohexol (OMNIPAQUE) 300 MG/ML solution 50 mL (50 mLs Oral Contrast Given 02/20/15 1442)  iohexol (OMNIPAQUE) 300 MG/ML solution 80 mL (80 mLs Intravenous Contrast Given 02/20/15 1442)    New Prescriptions    NAPROXEN (NAPROSYN) 500 MG TABLET    Take 1 tablet (500 mg total) by mouth 2 (two) times daily with a meal.      I personally performed the services described in this documentation, which was scribed in my presence. The recorded information has been reviewed and is accurate.      Noemi Chapel, MD 02/20/15 [phone]

## 2015-02-20 NOTE — ED Notes (Addendum)
Pt o left foot pain and swelling since yesterday. Denies injury. Edema noted to lle from knee to foot.

## 2016-05-13 ENCOUNTER — Encounter (HOSPITAL_COMMUNITY): Payer: Self-pay | Admitting: Emergency Medicine

## 2016-05-13 ENCOUNTER — Emergency Department (HOSPITAL_COMMUNITY)
Admission: EM | Admit: 2016-05-13 | Discharge: 2016-05-13 | Disposition: A | Discharge status: Home / Self Care | Payer: Medicare Other | Attending: Emergency Medicine | Admitting: Emergency Medicine

## 2016-05-13 ENCOUNTER — Emergency Department (HOSPITAL_COMMUNITY): Payer: Medicare Other

## 2016-05-13 DIAGNOSIS — M1711 Unilateral primary osteoarthritis, right knee: Secondary | ICD-10-CM

## 2016-05-13 DIAGNOSIS — I10 Essential (primary) hypertension: Secondary | ICD-10-CM | POA: Diagnosis not present

## 2016-05-13 DIAGNOSIS — Z7982 Long term (current) use of aspirin: Secondary | ICD-10-CM | POA: Insufficient documentation

## 2016-05-13 DIAGNOSIS — Z794 Long term (current) use of insulin: Secondary | ICD-10-CM | POA: Diagnosis not present

## 2016-05-13 DIAGNOSIS — M79604 Pain in right leg: Secondary | ICD-10-CM | POA: Diagnosis present

## 2016-05-13 DIAGNOSIS — E119 Type 2 diabetes mellitus without complications: Secondary | ICD-10-CM | POA: Insufficient documentation

## 2016-05-13 DIAGNOSIS — Z7984 Long term (current) use of oral hypoglycemic drugs: Secondary | ICD-10-CM | POA: Insufficient documentation

## 2016-05-13 MED ORDER — TRAMADOL HCL 50 MG PO TABS: 50.0000 mg | ORAL_TABLET | Freq: Four times a day (QID) | ORAL | Status: DC | PRN

## 2016-05-13 NOTE — ED Provider Notes (Signed)
CSN: [id number]     Arrival date & time 05/13/16  [id number] History   First MD Initiated Contact with Patient 05/13/16 [phone]     Chief Complaint  Patient presents with  . Leg Pain     (Consider location/radiation/quality/duration/timing/severity/associated sxs/prior Treatment) The history is provided by the patient.   Omar Parker is a 70 y.o. male with well controlled DM, Hypertension and hypercholesterolemia presenting with a three-day history of right knee pain and stiffness with intermittent episodes of shooting pain from the knee to the foot.  He denies injury, stating his pain started gradually over the past 3 days.  He is on his feet a lot walking with his job, but again denies injury.  He has noticed popping in the knee joint with flexion which he believes is new as his never noticed this before.  He has taken ibuprofen without relief of pain.  He denies swelling in the knee or lower leg, no numbness or tingling.  The radiating pain is episodic and sharp.    Past Medical History  Diagnosis Date  . Diabetes mellitus without complication ([place])   . Hypertension   . High cholesterol    Past Surgical History  Procedure Laterality Date  . Hernia repair     History reviewed. No pertinent family history. Social History  Substance Use Topics  . Smoking status: Never Smoker   . Smokeless tobacco: None  . Alcohol Use: No    Review of Systems  Constitutional: Negative for fever.  Musculoskeletal: Positive for arthralgias. Negative for myalgias and joint swelling.  Neurological: Negative for weakness and numbness.      Allergies  Review of patient's allergies indicates no known allergies.  Home Medications   Prior to Admission medications   Medication Sig Start Date End Date Taking? Authorizing Provider  aspirin 81 MG chewable tablet Chew 81 mg by mouth daily.   Yes Historical Provider, MD  glipiZIDE (GLUCOTROL) 10 MG tablet Take 10 mg by mouth daily before breakfast.   Yes  Historical Provider, MD  hydrochlorothiazide (HYDRODIURIL) 25 MG tablet Take 25 mg by mouth daily.   Yes Historical Provider, MD  insulin glargine (LANTUS) 100 UNIT/ML injection Inject 50 Units into the skin at bedtime.   Yes Historical Provider, MD  lisinopril (PRINIVIL,ZESTRIL) 40 MG tablet Take 40 mg by mouth daily.   Yes Historical Provider, MD  NIFEdipine (PROCARDIA XL/ADALAT-CC) 60 MG 24 hr tablet Take 60 mg by mouth daily.   Yes Historical Provider, MD  pravastatin (PRAVACHOL) 40 MG tablet Take 20 mg by mouth at bedtime.   Yes Historical Provider, MD  tadalafil (CIALIS) 20 MG tablet Take 20 mg by mouth daily as needed for erectile dysfunction.   Yes Historical Provider, MD  traMADol (ULTRAM) 50 MG tablet Take 1 tablet (50 mg total) by mouth every 6 (six) hours as needed for moderate pain. 05/13/16   Evalee Jefferson, PA-C   BP 174/90 mmHg  Pulse 79  Temp(Src) 97.7 F (36.5 C) (Oral)  Resp 19  Ht 5\' 11"  (1.803 m)  Wt 89.812 kg  BMI 27.63 kg/m2  SpO2 100% Physical Exam  Constitutional: He appears well-developed and well-nourished.  HENT:  Head: Atraumatic.  Neck: Normal range of motion.  Cardiovascular:  Pulses equal bilaterally  Musculoskeletal: He exhibits tenderness.       Right knee: He exhibits normal range of motion, no swelling, no effusion, no ecchymosis, no deformity, no erythema, no LCL laxity and no MCL laxity. Tenderness found. Medial  joint line tenderness noted. No patellar tendon tenderness noted.  Crepitus with extension after full flexion.    Neurological: He is alert. He has normal strength. He displays normal reflexes. No sensory deficit.  Skin: Skin is warm and dry.  Psychiatric: He has a normal mood and affect.    ED Course  Procedures (including critical care time) Labs Review Labs Reviewed - No data to display  Imaging Review Dg Knee Complete 4 Views Right  05/13/2016  CLINICAL DATA:  Chronic right knee pain.  No injury. EXAM: RIGHT KNEE - COMPLETE 4+ VIEW  COMPARISON:  None. FINDINGS: No joint effusion. There is sharpening of the tibial spines and medial compartment narrowing. No acute fracture or subluxation identified. IMPRESSION: 1. Osteoarthritis. 2. No acute findings. Electronically Signed   By: Kerby Moors M.D.   On: 05/13/2016 09:25   I have personally reviewed and evaluated these images and lab results as part of my medical decision-making.   EKG Interpretation None      MDM   Final diagnoses:  Primary osteoarthritis of right knee      Radiological studies were viewed, interpreted and considered during the medical decision making and disposition process. I agree with radiologists reading.  Results were also discussed with patient.  Pt was prescribed tramadol, advised heat, activity as tolerated, tylenol.  F/u with pcp prn.     Evalee Jefferson, PA-C 05/13/16 [place], DO 05/14/16 [phone]

## 2016-05-13 NOTE — ED Notes (Signed)
Pt states he has had stiffness and pain in right leg from knee down for the past 3 days with no injury.  States pain moves around in leg and is shooting at times.

## 2016-05-13 NOTE — Discharge Instructions (Signed)

## 2016-06-17 ENCOUNTER — Observation Stay (HOSPITAL_COMMUNITY): Payer: BLUE CROSS/BLUE SHIELD

## 2016-06-17 ENCOUNTER — Observation Stay (HOSPITAL_COMMUNITY)
Admission: EM | Admit: 2016-06-17 | Discharge: 2016-06-18 | Disposition: A | Discharge status: Home / Self Care | Payer: BLUE CROSS/BLUE SHIELD | Attending: Internal Medicine | Admitting: Internal Medicine

## 2016-06-17 ENCOUNTER — Encounter (HOSPITAL_COMMUNITY): Payer: Self-pay

## 2016-06-17 DIAGNOSIS — N183 Chronic kidney disease, stage 3 unspecified: Secondary | ICD-10-CM | POA: Diagnosis present

## 2016-06-17 DIAGNOSIS — E860 Dehydration: Secondary | ICD-10-CM | POA: Diagnosis not present

## 2016-06-17 DIAGNOSIS — Z131 Encounter for screening for diabetes mellitus: Secondary | ICD-10-CM

## 2016-06-17 DIAGNOSIS — Z79899 Other long term (current) drug therapy: Secondary | ICD-10-CM | POA: Diagnosis not present

## 2016-06-17 DIAGNOSIS — D649 Anemia, unspecified: Secondary | ICD-10-CM | POA: Insufficient documentation

## 2016-06-17 DIAGNOSIS — Z794 Long term (current) use of insulin: Secondary | ICD-10-CM | POA: Diagnosis not present

## 2016-06-17 DIAGNOSIS — Z7984 Long term (current) use of oral hypoglycemic drugs: Secondary | ICD-10-CM | POA: Diagnosis not present

## 2016-06-17 DIAGNOSIS — Z7982 Long term (current) use of aspirin: Secondary | ICD-10-CM | POA: Insufficient documentation

## 2016-06-17 DIAGNOSIS — E119 Type 2 diabetes mellitus without complications: Secondary | ICD-10-CM | POA: Insufficient documentation

## 2016-06-17 DIAGNOSIS — I129 Hypertensive chronic kidney disease with stage 1 through stage 4 chronic kidney disease, or unspecified chronic kidney disease: Secondary | ICD-10-CM | POA: Insufficient documentation

## 2016-06-17 DIAGNOSIS — N189 Chronic kidney disease, unspecified: Secondary | ICD-10-CM | POA: Insufficient documentation

## 2016-06-17 DIAGNOSIS — N179 Acute kidney failure, unspecified: Principal | ICD-10-CM

## 2016-06-17 DIAGNOSIS — R531 Weakness: Secondary | ICD-10-CM | POA: Diagnosis present

## 2016-06-17 LAB — CBC WITH DIFFERENTIAL/PLATELET
Basophils Absolute: 0.1 10*3/uL (ref 0.0–0.1)
Basophils Relative: 1 %
EOS ABS: 0.2 10*3/uL (ref 0.0–0.7)
EOS PCT: 2 %
HCT: 35.4 % — ABNORMAL LOW (ref 39.0–52.0)
Hemoglobin: 11.6 g/dL — ABNORMAL LOW (ref 13.0–17.0)
Lymphocytes Relative: 27 %
Lymphs Abs: 2.1 10*3/uL (ref 0.7–4.0)
MCH: 26 pg (ref 26.0–34.0)
MCHC: 32.8 g/dL (ref 30.0–36.0)
MCV: 79.2 fL (ref 78.0–100.0)
Monocytes Absolute: 0.6 10*3/uL (ref 0.1–1.0)
Monocytes Relative: 7 %
Neutro Abs: 5 10*3/uL (ref 1.7–7.7)
Neutrophils Relative %: 63 %
PLATELETS: 305 10*3/uL (ref 150–400)
RBC: 4.47 MIL/uL (ref 4.22–5.81)
RDW: 15.1 % (ref 11.5–15.5)
WBC: 8 10*3/uL (ref 4.0–10.5)

## 2016-06-17 LAB — COMPREHENSIVE METABOLIC PANEL
ALT: 20 U/L (ref 17–63)
ANION GAP: 5 (ref 5–15)
AST: 19 U/L (ref 15–41)
Albumin: 3.8 g/dL (ref 3.5–5.0)
Alkaline Phosphatase: 57 U/L (ref 38–126)
BILIRUBIN TOTAL: 0.3 mg/dL (ref 0.3–1.2)
BUN: 68 mg/dL — AB (ref 6–20)
CALCIUM: 8 mg/dL — AB (ref 8.9–10.3)
CHLORIDE: 111 mmol/L (ref 101–111)
CO2: 20 mmol/L — ABNORMAL LOW (ref 22–32)
Creatinine, Ser: 3.01 mg/dL — ABNORMAL HIGH (ref 0.61–1.24)
GFR, EST AFRICAN AMERICAN: 23 mL/min — AB (ref >60)
GFR, EST NON AFRICAN AMERICAN: 20 mL/min — AB (ref >60)
Glucose, Bld: 156 mg/dL — ABNORMAL HIGH (ref 65–99)
POTASSIUM: 4.2 mmol/L (ref 3.5–5.1)
Sodium: 136 mmol/L (ref 135–145)
TOTAL PROTEIN: 7.9 g/dL (ref 6.5–8.1)

## 2016-06-17 LAB — URINALYSIS, ROUTINE W REFLEX MICROSCOPIC
BILIRUBIN URINE: NEGATIVE
Glucose, UA: NEGATIVE mg/dL
HGB URINE DIPSTICK: NEGATIVE
Ketones, ur: NEGATIVE mg/dL
Leukocytes, UA: NEGATIVE
NITRITE: NEGATIVE
Protein, ur: NEGATIVE mg/dL
Specific Gravity, Urine: 1.025 (ref 1.005–1.030)
pH: 5.5 (ref 5.0–8.0)

## 2016-06-17 LAB — TROPONIN I: Troponin I: <0.03 ng/mL (ref <0.03)

## 2016-06-17 LAB — TSH: TSH: 1.891 u[IU]/mL (ref 0.350–4.500)

## 2016-06-17 LAB — GLUCOSE, CAPILLARY: Glucose-Capillary: 99 mg/dL (ref 65–99)

## 2016-06-17 MED ORDER — INSULIN ASPART 100 UNIT/ML ~~LOC~~ SOLN: 0.0000 [IU] | Freq: Every day | SUBCUTANEOUS | Status: DC

## 2016-06-17 MED ORDER — NIFEDIPINE ER OSMOTIC RELEASE 30 MG PO TB24
60.0000 mg | ORAL_TABLET | Freq: Every day | ORAL | Status: DC
  Administered 2016-06-17 – 2016-06-18 (×2): 60 mg via ORAL
  Filled 2016-06-17 (×2): qty 2

## 2016-06-17 MED ORDER — INSULIN ASPART 100 UNIT/ML ~~LOC~~ SOLN
0.0000 [IU] | Freq: Three times a day (TID) | SUBCUTANEOUS | Status: DC
  Administered 2016-06-18: 1 [IU] via SUBCUTANEOUS

## 2016-06-17 MED ORDER — GLIPIZIDE 5 MG PO TABS
10.0000 mg | ORAL_TABLET | Freq: Every day | ORAL | Status: DC
  Administered 2016-06-17 – 2016-06-18 (×2): 10 mg via ORAL
  Filled 2016-06-17 (×2): qty 2

## 2016-06-17 MED ORDER — PRAVASTATIN SODIUM 10 MG PO TABS
20.0000 mg | ORAL_TABLET | Freq: Every day | ORAL | Status: DC
  Administered 2016-06-17: 20 mg via ORAL
  Filled 2016-06-17: qty 2

## 2016-06-17 MED ORDER — ASPIRIN 81 MG PO CHEW
81.0000 mg | CHEWABLE_TABLET | Freq: Every day | ORAL | Status: DC
  Administered 2016-06-17 – 2016-06-18 (×2): 81 mg via ORAL
  Filled 2016-06-17 (×2): qty 1

## 2016-06-17 MED ORDER — SODIUM CHLORIDE 0.9 % IV BOLUS (SEPSIS)
1000.0000 mL | Freq: Once | INTRAVENOUS | Status: AC
Start: 2016-06-17 — End: 2016-06-17
  Administered 2016-06-17: 1000 mL via INTRAVENOUS

## 2016-06-17 MED ORDER — HEPARIN SODIUM (PORCINE) 5000 UNIT/ML IJ SOLN
5000.0000 [IU] | Freq: Three times a day (TID) | INTRAMUSCULAR | Status: DC
  Administered 2016-06-17: 5000 [IU] via SUBCUTANEOUS
  Filled 2016-06-17 (×2): qty 1

## 2016-06-17 MED ORDER — SODIUM CHLORIDE 0.9 % IV SOLN
INTRAVENOUS | Status: DC
  Administered 2016-06-17: 07:00:00 via INTRAVENOUS

## 2016-06-17 MED ORDER — SODIUM CHLORIDE 0.9 % IV SOLN
Freq: Once | INTRAVENOUS | Status: AC
  Administered 2016-06-17: 1000 mL via INTRAVENOUS

## 2016-06-17 NOTE — ED Triage Notes (Signed)
Pt states for the past few days he has "just felt weak all over"  States he has been working and has gotten hot, is not sure how to describe symptoms but denies pain or other complaint.

## 2016-06-17 NOTE — H&P (Signed)
[facility] History and Physical  TAMIKO ELLINGTON [id number] DOB: 08/05/1946 DOA: 06/17/2016  Referring physician: Dr Roxanne Mins.  PCP: Glo Herring., MD   Chief Complaint: Weakness for several days.   HPI: BABATUNDE MAGNON is a 70 y.o. male with hx of DM, HTN, suspicious for CKD3, presented to the ER having feeling weakness and lightheadedness for the past week.  He denied fever chills, abdominal pain, black or blood stool.  Evaluation in the ER included a Cr of 3.0, while his baseline Cr was not certain, but his Cr was found to be around 1.8 a few years ago.  He has a negative UA.  He has been on diuretics and ACE I.  Hospitalist was asked to admit him for acute renal failure.   Review of Systems:  Constitutional:  No weight loss, night sweats, Fevers, chills, fatigue.  HEENT:  No headaches, Difficulty swallowing,Tooth/dental problems,Sore throat,  No sneezing, itching, ear ache, nasal congestion, post nasal drip,  Cardio-vascular:  No chest pain, Orthopnea, PND, swelling in lower extremities, anasarca, dizziness, palpitations  GI:  No heartburn, indigestion, abdominal pain, nausea, vomiting, diarrhea, change in bowel habits, loss of appetite  Resp:  No shortness of breath with exertion or at rest. No excess mucus, no productive cough, No non-productive cough, No coughing up of blood.No change in color of mucus.No wheezing.No chest wall deformity  Skin:  no rash or lesions.  GU:  no dysuria, change in color of urine, no urgency or frequency. No flank pain.  Musculoskeletal:  No joint pain or swelling. No decreased range of motion. No back pain.  Psych:  No change in mood or affect. No depression or anxiety. No memory loss.   Past Medical History:  Diagnosis Date  . Diabetes mellitus without complication ([place])   . High cholesterol   . Hypertension    Past Surgical History:  Procedure Laterality Date  . HERNIA REPAIR     Social History:  reports that he has never smoked.  He has never used smokeless tobacco. He reports that he does not drink alcohol or use drugs.  No Known Allergies  History reviewed. No pertinent family history.   Prior to Admission medications   Medication Sig Start Date End Date Taking? Authorizing Provider  aspirin 81 MG chewable tablet Chew 81 mg by mouth daily.   Yes Historical Provider, MD  glipiZIDE (GLUCOTROL) 10 MG tablet Take 10 mg by mouth daily before breakfast.   Yes Historical Provider, MD  hydrochlorothiazide (HYDRODIURIL) 25 MG tablet Take 25 mg by mouth daily.   Yes Historical Provider, MD  insulin glargine (LANTUS) 100 UNIT/ML injection Inject 50 Units into the skin at bedtime.   Yes Historical Provider, MD  lisinopril (PRINIVIL,ZESTRIL) 40 MG tablet Take 40 mg by mouth daily.   Yes Historical Provider, MD  NIFEdipine (PROCARDIA XL/ADALAT-CC) 60 MG 24 hr tablet Take 60 mg by mouth daily.   Yes Historical Provider, MD  pravastatin (PRAVACHOL) 40 MG tablet Take 20 mg by mouth at bedtime.   Yes Historical Provider, MD  tadalafil (CIALIS) 20 MG tablet Take 20 mg by mouth daily as needed for erectile dysfunction.   Yes Historical Provider, MD  traMADol (ULTRAM) 50 MG tablet Take 1 tablet (50 mg total) by mouth every 6 (six) hours as needed for moderate pain. 05/13/16  Yes Evalee Jefferson, PA-C   Physical Exam: Vitals:   06/17/16 0316 06/17/16 0403  BP: 167/86   Pulse: 85   Resp: 18   Temp:  98.1 F (36.7 C)   TempSrc: Oral   SpO2: 99%   Weight:  89.8 kg (198 lb)  Height:  5\' 11"  (1.803 m)    Wt Readings from Last 3 Encounters:  06/17/16 89.8 kg (198 lb)  05/13/16 89.8 kg (198 lb)  02/20/15 89.4 kg (197 lb)    General:  Appears calm and comfortable Eyes: PERRL, normal lids, irises & conjunctiva ENT: grossly normal hearing, lips & tongue Neck: no LAD, masses or thyromegaly Cardiovascular: RRR, no m/r/g. No Anselma Herbel edema. Telemetry: SR, no arrhythmias  Respiratory: CTA bilaterally, no w/r/r. Normal respiratory  effort. Abdomen: soft, ntnd Skin: no rash or induration seen on limited exam Musculoskeletal: grossly normal tone BUE/BLE Psychiatric: grossly normal mood and affect, speech fluent and appropriate Neurologic: grossly non-focal.          Labs on Admission:  Basic Metabolic Panel:  Recent Labs Lab 06/17/16 0326  NA 136  K 4.2  CL 111  CO2 20*  GLUCOSE 156*  BUN 68*  CREATININE 3.01*  CALCIUM 8.0*   Liver Function Tests:  Recent Labs Lab 06/17/16 0326  AST 19  ALT 20  ALKPHOS 57  BILITOT 0.3  PROT 7.9  ALBUMIN 3.8   CBC:  Recent Labs Lab 06/17/16 0326  WBC 8.0  NEUTROABS 5.0  HGB 11.6*  HCT 35.4*  MCV 79.2  PLT 305   Cardiac Enzymes:  Recent Labs Lab 06/17/16 0326  TROPONINI <0.03   Assessment/Plan Principal Problem:   AKI (acute kidney injury) ([place]) Active Problems:   DM (diabetes mellitus screen)   Dehydration   CKD (chronic kidney disease), stage III   1. AKI on CKD:  I suspect patient has some degree of CKD with his hx of HTN and DM.  He likely has AKI on CKD3.  Will give IVF, hold ACE I and diuretics.  He is clinically volume depleted, so the elevated Cr may be due to pre-renal cause as well.   Will obtain renal US to exclude hydroneprhosis.  Follow Cr carefully, and avoid neprhotoxic drugs. 2. DM:  Will hold metformin, use SSI as needed. 3. Volume depletion:  Give IVF.  Hold Lasix.  He has elevated BUN/Cr ratio, and clinically apprears volemie depletion.   Code Status: FULL CODE.  DVT Prophylaxis:SUB Q heparin. Family Communication: None.  Disposition Plan: to home when appropriate.   Time spent: 43.  Helaina Stefano MD FACP.  [facility]

## 2016-06-17 NOTE — ED Notes (Signed)
Pt reports general weakness over the past 3 days, states just got off work & has been in [employer] lately. Pt says he tries to keep himself hydrated.

## 2016-06-17 NOTE — Progress Notes (Signed)
PROGRESS NOTE    Omar Parker  [id number] DOB: 02/11/1946 DOA: 06/17/2016 PCP: Glo Herring., MD    Brief Narrative:  70 y.o. male with hx of DM, HTN, suspicious for CKD3, presented to the ER having feeling weakness and lightheadedness for the past week.  He denied fever chills, abdominal pain, black or blood stool.  Evaluation in the ER included a Cr of 3.0, while his baseline Cr was not certain, but his Cr was found to be around 1.8 a few years ago.  He has a negative UA.  He has been on diuretics and ACE I.  Hospitalist was asked to admit him for acute renal failure.   Assessment & Plan:   Principal Problem:   AKI (acute kidney injury) ([place]) Active Problems:   DM (diabetes mellitus screen)   Dehydration   CKD (chronic kidney disease), stage III   1. Acute on chronic renal failure, stg 3 1. Cont IVF as tolerated 2. Likely secondary to dehydration as per below 3. Stable thus far 2. DM 1. Hold metformin secondary to renal insufficiency 2. Will add SSI coverage 3. Dehydration 1. Pt reports working outdoors in the heat. Suspect cause of dehydration 2. Will continue IVF as tolerated  DVT prophylaxis: Heparin subQ Code Status: Full Family Communication: Pt in room, family not at bedside Disposition Plan: Possible home in 24-48hrs  Consultants:     Procedures:     Antimicrobials: Anti-infectives    None       Subjective: No complaints. Denies sob  Objective: Vitals:   06/17/16 0403 06/17/16 0500 06/17/16 0655 06/17/16 0940  BP:  172/89 (!) 170/80 (!) 142/87  Pulse:  84 79 67  Resp:  13 18   Temp:   97.5 F (36.4 C)   TempSrc:   Oral   SpO2:  99% 98%   Weight: 89.8 kg (198 lb)  87.2 kg (192 lb 4.8 oz)   Height: 5\' 11"  (1.803 m)  5\' 11"  (1.803 m)     Intake/Output Summary (Last 24 hours) at 06/17/16 1323 Last data filed at 06/17/16 0535  Gross per 24 hour  Intake             1000 ml  Output                0 ml  Net             1000 ml    Filed Weights   06/17/16 0403 06/17/16 0655  Weight: 89.8 kg (198 lb) 87.2 kg (192 lb 4.8 oz)    Examination:  General exam: Appears calm and comfortable  Respiratory system: Clear to auscultation. Respiratory effort normal. Cardiovascular system: S1 & S2 heard, RRR Gastrointestinal system: Abdomen is nondistended, soft and nontender. No organomegaly or masses felt. Normal bowel sounds heard. Central nervous system: Alert and oriented. No focal neurological deficits. Extremities: Symmetric 5 x 5 power. Skin: No rashes, lesions Psychiatry: Judgement and insight appear normal. Mood & affect appropriate.   Data Reviewed: I have personally reviewed following labs and imaging studies  CBC:  Recent Labs Lab 06/17/16 0326  WBC 8.0  NEUTROABS 5.0  HGB 11.6*  HCT 35.4*  MCV 79.2  PLT 123456   Basic Metabolic Panel:  Recent Labs Lab 06/17/16 0326  NA 136  K 4.2  CL 111  CO2 20*  GLUCOSE 156*  BUN 68*  CREATININE 3.01*  CALCIUM 8.0*   GFR: Estimated Creatinine Clearance: 25 mL/min (by C-G formula based on SCr  of 3.01 mg/dL). Liver Function Tests:  Recent Labs Lab 06/17/16 0326  AST 19  ALT 20  ALKPHOS 57  BILITOT 0.3  PROT 7.9  ALBUMIN 3.8   No results for input(s): LIPASE, AMYLASE in the last 168 hours. No results for input(s): AMMONIA in the last 168 hours. Coagulation Profile: No results for input(s): INR, PROTIME in the last 168 hours. Cardiac Enzymes:  Recent Labs Lab 06/17/16 0326  TROPONINI <0.03   BNP (last 3 results) No results for input(s): PROBNP in the last 8760 hours. HbA1C: No results for input(s): HGBA1C in the last 72 hours. CBG: No results for input(s): GLUCAP in the last 168 hours. Lipid Profile: No results for input(s): CHOL, HDL, LDLCALC, TRIG, CHOLHDL, LDLDIRECT in the last 72 hours. Thyroid Function Tests:  Recent Labs  06/17/16 0326  TSH 1.891   Anemia Panel: No results for input(s): VITAMINB12, FOLATE, FERRITIN, TIBC,  IRON, RETICCTPCT in the last 72 hours. Sepsis Labs: No results for input(s): PROCALCITON, LATICACIDVEN in the last 168 hours.  No results found for this or any previous visit (from the past 240 hour(s)).   Radiology Studies: US Renal  Result Date: 06/17/2016 CLINICAL DATA:  Acute kidney injury with chronic kidney disease, stage III. EXAM: RENAL / URINARY TRACT ULTRASOUND COMPLETE COMPARISON:  CT, 02/20/2015 FINDINGS: Right Kidney: Length: 10.0 cm. Normal echogenicity. 17 mm upper pole cyst stable from the prior CT. No other masses, no stones and no hydronephrosis. Left Kidney: Length: 13.4 cm. Normal parenchymal echogenicity. Irregular 12 x 10 x 17 mm cystic lesion in the renal sinus of the midpole. CT demonstrates a cyst in this location. No other renal masses, no stones and no hydronephrosis. Bladder: Appears normal for degree of bladder distention. IMPRESSION: 1. No hydronephrosis or acute finding. 2. 17 mm upper pole right renal cyst. 3. 17 mm irregular cystic lesion in the midpole renal sinus of the left kidney. This is essentially stable in size from the CT dated 02/20/2015 and therefore most likely a benign cyst. Consider followup ultrasound in 6 months to reassess. Electronically Signed   By: Lajean Manes M.D.   On: 06/17/2016 10:18    Scheduled Meds: . aspirin  81 mg Oral Daily  . glipiZIDE  10 mg Oral QAC breakfast  . heparin  5,000 Units Subcutaneous Q8H  . NIFEdipine  60 mg Oral Daily  . pravastatin  20 mg Oral QHS   Continuous Infusions: . sodium chloride 150 mL/hr at 06/17/16 0644     LOS: 0 days   Theron Cumbie, Orpah Melter, MD [facility] Pager [phone]  If 7PM-7AM, please contact night-coverage [email or web address] Password [email or web address] 06/17/2016, 1:23 PM

## 2016-06-17 NOTE — ED Provider Notes (Signed)
[facility] DEPT Provider Note   CSN: [id number] Arrival date & time: 06/17/16  0307     History   Chief Complaint Chief Complaint  Patient presents with  . Weakness    generalized    HPI Omar Parker is a 70 y.o. male.  The history is provided by the patient.  Weakness   He has history of diabetes and hypertension. For the last week, he has had intermittent episodes where he feels weak and lightheaded. He describes it as if his sugar was too low or too high. However, when he checks his sugar during the spells, it is in an acceptable range (130-150). He is not hurting anywhere. He denies vertigo. There is no dyspnea, nausea, vomiting. The weakness is generalized and not focal in any way. Episodes will last about one hour. Symptoms are not affected by anything that he has been able to identify.  Past Medical History:  Diagnosis Date  . Diabetes mellitus without complication ([place])   . High cholesterol   . Hypertension     There are no active problems to display for this patient.   Past Surgical History:  Procedure Laterality Date  . HERNIA REPAIR         Home Medications    Prior to Admission medications   Medication Sig Start Date End Date Taking? Authorizing Provider  aspirin 81 MG chewable tablet Chew 81 mg by mouth daily.   Yes Historical Provider, MD  glipiZIDE (GLUCOTROL) 10 MG tablet Take 10 mg by mouth daily before breakfast.   Yes Historical Provider, MD  hydrochlorothiazide (HYDRODIURIL) 25 MG tablet Take 25 mg by mouth daily.   Yes Historical Provider, MD  insulin glargine (LANTUS) 100 UNIT/ML injection Inject 50 Units into the skin at bedtime.   Yes Historical Provider, MD  lisinopril (PRINIVIL,ZESTRIL) 40 MG tablet Take 40 mg by mouth daily.   Yes Historical Provider, MD  NIFEdipine (PROCARDIA XL/ADALAT-CC) 60 MG 24 hr tablet Take 60 mg by mouth daily.   Yes Historical Provider, MD  pravastatin (PRAVACHOL) 40 MG tablet Take 20 mg by mouth at bedtime.    Yes Historical Provider, MD  tadalafil (CIALIS) 20 MG tablet Take 20 mg by mouth daily as needed for erectile dysfunction.   Yes Historical Provider, MD  traMADol (ULTRAM) 50 MG tablet Take 1 tablet (50 mg total) by mouth every 6 (six) hours as needed for moderate pain. 05/13/16  Yes Evalee Jefferson, PA-C    Family History No family history on file.  Social History Social History  Substance Use Topics  . Smoking status: Never Smoker  . Smokeless tobacco: Never Used  . Alcohol use No     Allergies   Review of patient's allergies indicates no known allergies.   Review of Systems Review of Systems  Neurological: Positive for weakness.  All other systems reviewed and are negative.    Physical Exam Updated Vital Signs BP 172/89   Pulse 84   Temp 98.1 F (36.7 C) (Oral)   Resp 13   Ht 5\' 11"  (1.803 m)   Wt 198 lb (89.8 kg)   SpO2 99%   BMI 27.62 kg/m   Physical Exam  Nursing note and vitals reviewed.  70 year old male, resting comfortably and in no acute distress. Vital signs are significant for hypertension. Oxygen saturation is 99%, which is normal. Head is normocephalic and atraumatic. PERRLA, EOMI. Oropharynx is clear. Neck is nontender and supple without adenopathy or JVD. Back is nontender and  there is no CVA tenderness. Lungs are clear without rales, wheezes, or rhonchi. Chest is nontender. Heart has regular rate and rhythm without murmur. Abdomen is soft, flat, nontender without masses or hepatosplenomegaly and peristalsis is normoactive. Extremities have no cyanosis or edema, full range of motion is present. Skin is warm and dry without rash. Neurologic: Mental status is normal, cranial nerves are intact, there are no motor or sensory deficits. Dizziness is not reproduced by passive head movement.  ED Treatments / Results  Labs (all labs ordered are listed, but only abnormal results are displayed) Labs Reviewed  COMPREHENSIVE METABOLIC PANEL - Abnormal;  Notable for the following:       Result Value   CO2 20 (*)    Glucose, Bld 156 (*)    BUN 68 (*)    Creatinine, Ser 3.01 (*)    Calcium 8.0 (*)    GFR calc non Af Amer 20 (*)    GFR calc Af Amer 23 (*)    All other components within normal limits  CBC WITH DIFFERENTIAL/PLATELET - Abnormal; Notable for the following:    Hemoglobin 11.6 (*)    HCT 35.4 (*)    All other components within normal limits  TROPONIN I  URINALYSIS, ROUTINE W REFLEX MICROSCOPIC (NOT AT [facility])    EKG  EKG Interpretation  Date/Time:  Saturday June 17 2016 03:37:09 EDT Ventricular Rate:  77 PR Interval:    QRS Duration: 91 QT Interval:  381 QTC Calculation: 432 R Axis:   15 Text Interpretation:  Sinus rhythm Normal ECG When compared with ECG of 03/23/2008, No significant change was found Confirmed by [facility]  MD, Elizar Alpern ([id number]) on 06/17/2016 3:43:39 AM       Procedures Procedures (including critical care time)  Medications Ordered in ED Medications  sodium chloride 0.9 % bolus 1,000 mL (0 mLs Intravenous Stopped 06/17/16 0534)  0.9 %  sodium chloride infusion (1,000 mLs Intravenous New Bag/Given 06/17/16 0437)     Initial Impression / Assessment and Plan / ED Course  I have reviewed the triage vital signs and the nursing notes.  Pertinent labs that were available during my care of the patient were reviewed by me and considered in my medical decision making (see chart for details).  Clinical Course    Episodes of weakness and lightheadedness of uncertain cause. ECG is normal. Screening labs are obtained. Old records are reviewed, and he has no relevant past visits. If no obvious pathology identified with screening labs, may benefit from cardiac monitoring with Holter monitor or event monitor.  Laboratory workup shows worsening of chronic renal insufficiency. Last creatinine in April 2016 was 1.83 and has risen to over 3 today. Patient states that he [facility] 84 [facility] and he was told that his creatinine was under 2. Mild metabolic acidosis is present consistent with renal failure. Elevation of the BUN was greater than elevation creatinine indicating he may be somewhat dehydrated as was given IV fluid bolus. He is on an ACE inhibitor which may be contributing to his renal failure. He states that he had been on some medicine for plantar fasciitis which had been stopped. I wonder if it was an NSAID which could also contribute to renal failure. Mild anemia is present and could be related to renal failure. Because of this is an acute change, it was felt that hospitalization would be appropriate. Case is discussed with Dr. Marin Comment of  [facility] who agrees to admit the patient.  Final Clinical Impressions(s) / ED Diagnoses   Final diagnoses:  Acute on chronic renal failure (HCC)  Normochromic normocytic anemia    New Prescriptions New Prescriptions   No medications on file     Delora Fuel, MD [id number]

## 2016-06-18 DIAGNOSIS — E860 Dehydration: Secondary | ICD-10-CM

## 2016-06-18 DIAGNOSIS — N179 Acute kidney failure, unspecified: Secondary | ICD-10-CM | POA: Diagnosis not present

## 2016-06-18 DIAGNOSIS — N183 Chronic kidney disease, stage 3 (moderate): Secondary | ICD-10-CM | POA: Diagnosis not present

## 2016-06-18 DIAGNOSIS — Z131 Encounter for screening for diabetes mellitus: Secondary | ICD-10-CM

## 2016-06-18 LAB — BASIC METABOLIC PANEL
Anion gap: 7 (ref 5–15)
BUN: 31 mg/dL — AB (ref 6–20)
CALCIUM: 8 mg/dL — AB (ref 8.9–10.3)
CO2: 21 mmol/L — AB (ref 22–32)
CREATININE: 1.5 mg/dL — AB (ref 0.61–1.24)
Chloride: 109 mmol/L (ref 101–111)
GFR calc Af Amer: 53 mL/min — ABNORMAL LOW (ref >60)
GFR, EST NON AFRICAN AMERICAN: 46 mL/min — AB (ref >60)
GLUCOSE: 108 mg/dL — AB (ref 65–99)
Potassium: 4.2 mmol/L (ref 3.5–5.1)
Sodium: 137 mmol/L (ref 135–145)

## 2016-06-18 LAB — GLUCOSE, CAPILLARY: Glucose-Capillary: 122 mg/dL — ABNORMAL HIGH (ref 65–99)

## 2016-06-18 MED ORDER — ACETAMINOPHEN 325 MG PO TABS
650.0000 mg | ORAL_TABLET | Freq: Four times a day (QID) | ORAL | Status: DC | PRN
  Administered 2016-06-18: 650 mg via ORAL
  Filled 2016-06-18: qty 2

## 2016-06-18 NOTE — Progress Notes (Signed)
Pt has refused CBG and insulin. Pt stated that he will check CBG at home.

## 2016-06-18 NOTE — Discharge Summary (Signed)
Physician Discharge Summary  Omar Parker [id number] DOB: 01-29-1946 DOA: 06/17/2016  PCP: Glo Herring., MD  Admit date: 06/17/2016 Discharge date: 06/18/2016  Admitted From: Home Disposition:  Home  Recommendations for Outpatient Follow-up:  1. Follow up with PCP in 1-2 weeks 2. Please repeat BMET in 1 week, focus on renal function  Discharge Condition:Improved CODE STATUS:Full Diet recommendation: Diabetic   Brief/Interim Summary: 70 y.o.malewith hx of DM, HTN, suspicious for CKD3, presented to the ER having feeling weakness and lightheadedness for the past week. He denied fever chills, abdominal pain, black or blood stool. Evaluation in the ER included a Cr of 3.0, while his baseline Cr was not certain, but his Cr was found to be around 1.8 a few years ago. He has a negative UA. He has been on diuretics and ACE I. Hospitalist was asked to admit him for acute renal failure.   1. Acute on chronic renal failure, stg 3 1. Improved with IVF 2. Likely secondary to dehydration as per below 3. Cr has improved to markedly 2. DM2 1. Hold metformin secondary to renal insufficiency 2. Will add SSI coverage 3. Dehydration 1. Pt reports working outdoors in the heat. Suspect cause of dehydration 2. Much improved with IVF hydration  Discharge Diagnoses:  Principal Problem:   AKI (acute kidney injury) ([place]) Active Problems:   DM (diabetes mellitus screen)   Dehydration   CKD (chronic kidney disease), stage III   Discharge Instructions     Medication List    TAKE these medications   aspirin 81 MG chewable tablet Chew 81 mg by mouth daily.   glipiZIDE 10 MG tablet Commonly known as:  GLUCOTROL Take 10 mg by mouth daily before breakfast.   hydrochlorothiazide 25 MG tablet Commonly known as:  HYDRODIURIL Take 25 mg by mouth daily.   insulin glargine 100 UNIT/ML injection Commonly known as:  LANTUS Inject 50 Units into the skin at bedtime.   lisinopril 40 MG  tablet Commonly known as:  PRINIVIL,ZESTRIL Take 40 mg by mouth daily.   NIFEdipine 60 MG 24 hr tablet Commonly known as:  PROCARDIA XL/ADALAT-CC Take 60 mg by mouth daily.   pravastatin 40 MG tablet Commonly known as:  PRAVACHOL Take 20 mg by mouth at bedtime.   tadalafil 20 MG tablet Commonly known as:  CIALIS Take 20 mg by mouth daily as needed for erectile dysfunction.   traMADol 50 MG tablet Commonly known as:  ULTRAM Take 1 tablet (50 mg total) by mouth every 6 (six) hours as needed for moderate pain.      Follow-up Information    Glo Herring., MD. Schedule an appointment as soon as possible for a visit in 1 week(s).   Specialty:  Internal Medicine Contact information: [address] [place] [id number] [phone]          No Known Allergies   Procedures/Studies: US Renal  Result Date: 06/17/2016 CLINICAL DATA:  Acute kidney injury with chronic kidney disease, stage III. EXAM: RENAL / URINARY TRACT ULTRASOUND COMPLETE COMPARISON:  CT, 02/20/2015 FINDINGS: Right Kidney: Length: 10.0 cm. Normal echogenicity. 17 mm upper pole cyst stable from the prior CT. No other masses, no stones and no hydronephrosis. Left Kidney: Length: 13.4 cm. Normal parenchymal echogenicity. Irregular 12 x 10 x 17 mm cystic lesion in the renal sinus of the midpole. CT demonstrates a cyst in this location. No other renal masses, no stones and no hydronephrosis. Bladder: Appears normal for degree of bladder distention. IMPRESSION: 1. No hydronephrosis  or acute finding. 2. 17 mm upper pole right renal cyst. 3. 17 mm irregular cystic lesion in the midpole renal sinus of the left kidney. This is essentially stable in size from the CT dated 02/20/2015 and therefore most likely a benign cyst. Consider followup ultrasound in 6 months to reassess. Electronically Signed   By: Lajean Manes M.D.   On: 06/17/2016 10:18     Subjective: Eager to go home  Discharge Exam: Vitals:   06/18/16  0632 06/18/16 0915  BP: (!) 119/100 (!) 141/78  Pulse: 73 86  Resp: 18   Temp: 98.1 F (36.7 C)    Vitals:   06/17/16 0940 06/17/16 2158 06/18/16 0632 06/18/16 0915  BP: (!) 142/87 (!) 141/71 (!) 119/100 (!) 141/78  Pulse: 67 80 73 86  Resp:  18 18   Temp:  98.6 F (37 C) 98.1 F (36.7 C)   TempSrc:  Oral Oral   SpO2:  100% 100%   Weight:      Height:        General: Pt is alert, awake, not in acute distress Cardiovascular: RRR, S1/S2 +, no rubs, no gallops Respiratory: CTA bilaterally, no wheezing, no rhonchi Abdominal: Soft, NT, ND, bowel sounds + Extremities: no edema, no cyanosis   The results of significant diagnostics from this hospitalization (including imaging, microbiology, ancillary and laboratory) are listed below for reference.     Microbiology: No results found for this or any previous visit (from the past 240 hour(s)).   Labs: BNP (last 3 results) No results for input(s): BNP in the last 8760 hours. Basic Metabolic Panel:  Recent Labs Lab 06/17/16 0326 06/18/16 0607  NA 136 137  K 4.2 4.2  CL 111 109  CO2 20* 21*  GLUCOSE 156* 108*  BUN 68* 31*  CREATININE 3.01* 1.50*  CALCIUM 8.0* 8.0*   Liver Function Tests:  Recent Labs Lab 06/17/16 0326  AST 19  ALT 20  ALKPHOS 57  BILITOT 0.3  PROT 7.9  ALBUMIN 3.8   No results for input(s): LIPASE, AMYLASE in the last 168 hours. No results for input(s): AMMONIA in the last 168 hours. CBC:  Recent Labs Lab 06/17/16 0326  WBC 8.0  NEUTROABS 5.0  HGB 11.6*  HCT 35.4*  MCV 79.2  PLT 305   Cardiac Enzymes:  Recent Labs Lab 06/17/16 0326  TROPONINI <0.03   BNP: Invalid input(s): POCBNP CBG:  Recent Labs Lab 06/17/16 1648 06/18/16 0735  GLUCAP 99 122*   D-Dimer No results for input(s): DDIMER in the last 72 hours. Hgb A1c No results for input(s): HGBA1C in the last 72 hours. Lipid Profile No results for input(s): CHOL, HDL, LDLCALC, TRIG, CHOLHDL, LDLDIRECT in the last 72  hours. Thyroid function studies  Recent Labs  06/17/16 0326  TSH 1.891   Anemia work up No results for input(s): VITAMINB12, FOLATE, FERRITIN, TIBC, IRON, RETICCTPCT in the last 72 hours. Urinalysis    Component Value Date/Time   COLORURINE YELLOW 06/17/2016 0331   APPEARANCEUR CLEAR 06/17/2016 0331   LABSPEC 1.025 06/17/2016 0331   PHURINE 5.5 06/17/2016 0331   GLUCOSEU NEGATIVE 06/17/2016 0331   HGBUR NEGATIVE 06/17/2016 0331   BILIRUBINUR NEGATIVE 06/17/2016 0331   KETONESUR NEGATIVE 06/17/2016 0331   PROTEINUR NEGATIVE 06/17/2016 0331   NITRITE NEGATIVE 06/17/2016 0331   LEUKOCYTESUR NEGATIVE 06/17/2016 0331   Sepsis Labs Invalid input(s): PROCALCITONIN,  WBC,  LACTICIDVEN Microbiology No results found for this or any previous visit (from the past 240 hour(s)).  SIGNED:   Donne Hazel, MD  [facility] 06/18/2016, 10:53 AM  If 7PM-7AM, please contact night-coverage [email or web address] Password [email or web address]

## 2016-06-18 NOTE — Progress Notes (Signed)
Pt's IV catheter removed and intact. Pt's IV site clean dry and intact. Discharge instructions including medications and follow up appointments were reviewed and discussed with patient. Pt verbalized understanding of discharge instructions including medications and follow up appointments All questions were answered and no further questions at this time. Pt in stable condition and in no acute distress at time of discharge. Pt escorted by nurse tech

## 2017-03-08 ENCOUNTER — Encounter (HOSPITAL_COMMUNITY): Payer: Self-pay | Admitting: Emergency Medicine

## 2017-03-08 ENCOUNTER — Emergency Department (HOSPITAL_COMMUNITY)
Admission: EM | Admit: 2017-03-08 | Discharge: 2017-03-08 | Disposition: A | Discharge status: Home / Self Care | Payer: BLUE CROSS/BLUE SHIELD | Attending: Emergency Medicine | Admitting: Emergency Medicine

## 2017-03-08 ENCOUNTER — Emergency Department (HOSPITAL_COMMUNITY): Payer: BLUE CROSS/BLUE SHIELD

## 2017-03-08 DIAGNOSIS — Z794 Long term (current) use of insulin: Secondary | ICD-10-CM | POA: Diagnosis not present

## 2017-03-08 DIAGNOSIS — E1122 Type 2 diabetes mellitus with diabetic chronic kidney disease: Secondary | ICD-10-CM | POA: Insufficient documentation

## 2017-03-08 DIAGNOSIS — R799 Abnormal finding of blood chemistry, unspecified: Secondary | ICD-10-CM | POA: Diagnosis present

## 2017-03-08 DIAGNOSIS — Z79899 Other long term (current) drug therapy: Secondary | ICD-10-CM | POA: Insufficient documentation

## 2017-03-08 DIAGNOSIS — I129 Hypertensive chronic kidney disease with stage 1 through stage 4 chronic kidney disease, or unspecified chronic kidney disease: Secondary | ICD-10-CM | POA: Diagnosis not present

## 2017-03-08 DIAGNOSIS — D631 Anemia in chronic kidney disease: Secondary | ICD-10-CM | POA: Insufficient documentation

## 2017-03-08 DIAGNOSIS — N183 Chronic kidney disease, stage 3 unspecified: Secondary | ICD-10-CM

## 2017-03-08 LAB — CBC WITH DIFFERENTIAL/PLATELET
Basophils Absolute: 0.1 10*3/uL (ref 0.0–0.1)
Basophils Relative: 1 %
EOS ABS: 0.2 10*3/uL (ref 0.0–0.7)
Eosinophils Relative: 3 %
HCT: 39 % (ref 39.0–52.0)
HEMOGLOBIN: 12.8 g/dL — AB (ref 13.0–17.0)
LYMPHS ABS: 1.7 10*3/uL (ref 0.7–4.0)
Lymphocytes Relative: 25 %
MCH: 26 pg (ref 26.0–34.0)
MCHC: 32.8 g/dL (ref 30.0–36.0)
MCV: 79.3 fL (ref 78.0–100.0)
MONO ABS: 0.5 10*3/uL (ref 0.1–1.0)
Monocytes Relative: 7 %
NEUTROS PCT: 64 %
Neutro Abs: 4.2 10*3/uL (ref 1.7–7.7)
Platelets: 322 10*3/uL (ref 150–400)
RBC: 4.92 MIL/uL (ref 4.22–5.81)
RDW: 16.1 % — AB (ref 11.5–15.5)
WBC: 6.6 10*3/uL (ref 4.0–10.5)

## 2017-03-08 LAB — COMPREHENSIVE METABOLIC PANEL
ALT: 20 U/L (ref 17–63)
AST: 23 U/L (ref 15–41)
Albumin: 3.8 g/dL (ref 3.5–5.0)
Alkaline Phosphatase: 72 U/L (ref 38–126)
Anion gap: 8 (ref 5–15)
BUN: 29 mg/dL — AB (ref 6–20)
CHLORIDE: 105 mmol/L (ref 101–111)
CO2: 24 mmol/L (ref 22–32)
CREATININE: 1.92 mg/dL — AB (ref 0.61–1.24)
Calcium: 8.8 mg/dL — ABNORMAL LOW (ref 8.9–10.3)
GFR calc non Af Amer: 34 mL/min — ABNORMAL LOW (ref >60)
GFR, EST AFRICAN AMERICAN: 39 mL/min — AB (ref >60)
GLUCOSE: 141 mg/dL — AB (ref 65–99)
Potassium: 3.9 mmol/L (ref 3.5–5.1)
SODIUM: 137 mmol/L (ref 135–145)
Total Bilirubin: 0.6 mg/dL (ref 0.3–1.2)
Total Protein: 8.1 g/dL (ref 6.5–8.1)

## 2017-03-08 LAB — PROTIME-INR
INR: 0.93
Prothrombin Time: 12.5 seconds (ref 11.4–15.2)

## 2017-03-08 LAB — TYPE AND SCREEN
ABO/RH(D): A POS
ANTIBODY SCREEN: NEGATIVE

## 2017-03-08 LAB — POC OCCULT BLOOD, ED: FECAL OCCULT BLD: NEGATIVE

## 2017-03-08 MED ORDER — SODIUM CHLORIDE 0.9 % IV SOLN
INTRAVENOUS | Status: DC
  Administered 2017-03-08: 10:00:00 via INTRAVENOUS

## 2017-03-08 MED ORDER — PANTOPRAZOLE SODIUM 40 MG IV SOLR
40.0000 mg | Freq: Once | INTRAVENOUS | Status: AC
  Administered 2017-03-08: 40 mg via INTRAVENOUS
  Filled 2017-03-08: qty 40

## 2017-03-08 MED ORDER — SODIUM CHLORIDE 0.9 % IV BOLUS (SEPSIS)
1000.0000 mL | Freq: Once | INTRAVENOUS | Status: AC
  Administered 2017-03-08: 1000 mL via INTRAVENOUS

## 2017-03-08 NOTE — ED Triage Notes (Signed)
Pt had complete physical Monday,  PCP called him with low HGB 11.8, they check hemoccult card positive for blood. Pt states have some diarrhea, denies pain

## 2017-03-08 NOTE — ED Provider Notes (Signed)
[facility] DEPT Provider Note   CSN: [id number] Arrival date & time: 03/08/17  9379  By signing my name below, I, [name], attest that this documentation has been prepared under the direction and in the presence of Isla Pence, MD. Electronically Signed: [name], Scribe. 03/08/17. 9:10 AM.  History   Chief Complaint Chief Complaint  Patient presents with  . Abnormal Lab    The history is provided by the patient. No language interpreter was used.     HPI Comments: MAJED Parker is a 71 y.o. male who presents to the Emergency Department with abnormal labs today. Patient had a routine physical on 03/05/17 and was called today due to a hemoglobin result of 11.8. He notes checking a guaiac card which was positive for blood. He reports currently having diarrhea with associated mild abdominal discomfort.  This started yesterday and is a normal color (not black).  He reports having a gastric ulcer that was treated in the 1980's but denies and recent GI or bleeding related issues. He denies rectal bleeding, melena, abdominal pain.   Lab results from PCP: Hemoglobin 11.8 Hematocrit: 36.5 BUN: 36  Creatinine: 1.88  Past Medical History:  Diagnosis Date  . Diabetes mellitus without complication ([place])   . High cholesterol   . Hypertension     Patient Active Problem List   Diagnosis Date Noted  . AKI (acute kidney injury) ([place]) 06/17/2016  . DM (diabetes mellitus screen) 06/17/2016  . Dehydration 06/17/2016  . CKD (chronic kidney disease), stage III 06/17/2016    Past Surgical History:  Procedure Laterality Date  . HERNIA REPAIR         Home Medications    Prior to Admission medications   Medication Sig Start Date End Date Taking? Authorizing Provider  aspirin 81 MG chewable tablet Chew 81 mg by mouth daily.    [provider]  glipiZIDE (GLUCOTROL) 10 MG tablet Take 10 mg by mouth daily before breakfast.    [provider]  hydrochlorothiazide  (HYDRODIURIL) 25 MG tablet Take 25 mg by mouth daily.    [provider]  insulin glargine (LANTUS) 100 UNIT/ML injection Inject 50 Units into the skin at bedtime.    [provider]  lisinopril (PRINIVIL,ZESTRIL) 40 MG tablet Take 40 mg by mouth daily.    [provider]  NIFEdipine (PROCARDIA XL/ADALAT-CC) 60 MG 24 hr tablet Take 60 mg by mouth daily.    [provider]  pravastatin (PRAVACHOL) 40 MG tablet Take 20 mg by mouth at bedtime.    [provider]  tadalafil (CIALIS) 20 MG tablet Take 20 mg by mouth daily as needed for erectile dysfunction.    [provider]  traMADol (ULTRAM) 50 MG tablet Take 1 tablet (50 mg total) by mouth every 6 (six) hours as needed for moderate pain. 05/13/16   Evalee Jefferson, PA-C    Family History No family history on file.  Social History Social History  Substance Use Topics  . Smoking status: Never Smoker  . Smokeless tobacco: Never Used  . Alcohol use No     Allergies   Patient has no known allergies.   Review of Systems Review of Systems  Constitutional: Negative for fever.  Gastrointestinal: Positive for blood in stool (hemoccult +) and diarrhea. Negative for abdominal pain and anal bleeding.  All other systems reviewed and are negative.    Physical Exam Updated Vital Signs BP (!) 180/91   Pulse 76   Temp 97.9 F (  36.6 C) (Oral)   Resp 11   Ht 5\' 11"  (1.803 m)   Wt 198 lb (89.8 kg)   SpO2 100%   BMI 27.62 kg/m   Physical Exam  Constitutional: He is oriented to person, place, and time. He appears well-developed and well-nourished.  HENT:  Head: Normocephalic and atraumatic.  Eyes: EOM are normal.  Neck: Normal range of motion.  Cardiovascular: Normal rate, regular rhythm, normal heart sounds and intact distal pulses.   Pulmonary/Chest: Effort normal and breath sounds normal. No respiratory distress.  Abdominal: Soft. He exhibits no distension. There is no tenderness.    Genitourinary: Rectum normal. Rectal exam shows guaiac negative stool.  Musculoskeletal: Normal range of motion.  Neurological: He is alert and oriented to person, place, and time.  Skin: Skin is warm and dry.  Psychiatric: He has a normal mood and affect. Judgment normal.  Nursing note and vitals reviewed.   ED Treatments / Results  DIAGNOSTIC STUDIES: Oxygen Saturation is 99% on RA, normal by my interpretation.    COORDINATION OF CARE: 9:05 AM Discussed treatment plan with pt at bedside and pt agreed to plan.   Labs (all labs ordered are listed, but only abnormal results are displayed) Labs Reviewed  COMPREHENSIVE METABOLIC PANEL - Abnormal; Notable for the following:       Result Value   Glucose, Bld 141 (*)    BUN 29 (*)    Creatinine, Ser 1.92 (*)    Calcium 8.8 (*)    GFR calc non Af Amer 34 (*)    GFR calc Af Amer 39 (*)    All other components within normal limits  CBC WITH DIFFERENTIAL/PLATELET - Abnormal; Notable for the following:    Hemoglobin 12.8 (*)    RDW 16.1 (*)    All other components within normal limits  PROTIME-INR  POC OCCULT BLOOD, ED  TYPE AND SCREEN    EKG  EKG Interpretation None       Radiology Dg Abd Acute W/chest  Result Date: 03/08/2017 CLINICAL DATA:  GI bleed EXAM: DG ABDOMEN ACUTE W/ 1V CHEST COMPARISON:  03/23/2008 FINDINGS: There is no evidence of dilated bowel loops or free intraperitoneal air. No radiopaque calculi or other significant radiographic abnormality is seen. Heart size and mediastinal contours are within normal limits. Both lungs are clear. IMPRESSION: Negative abdominal radiographs.  No acute cardiopulmonary disease. Electronically Signed   By: Rolm Baptise M.D.   On: 03/08/2017 10:00    Procedures Procedures (including critical care time)  Medications Ordered in ED Medications  sodium chloride 0.9 % bolus 1,000 mL (1,000 mLs Intravenous New Bag/Given 03/08/17 0934)    And  0.9 %  sodium chloride infusion (  Intravenous New Bag/Given 03/08/17 1006)  pantoprazole (PROTONIX) injection 40 mg (40 mg Intravenous Given 03/08/17 0934)     Initial Impression / Assessment and Plan / ED Course  I have reviewed the triage vital signs and the nursing notes.  Pertinent labs & imaging results that were available during my care of the patient were reviewed by me and considered in my medical decision making (see chart for details).    Pt's + stool guaiac was done by pt, not his doctor.  Our stool guaiac is negative.  Pt's hgb is improved from the 7th.  He is asymptomatic.  Pt has not had a colonoscopy in "a long time."  He is given the number of GI to follow up if needed.  The pt is aware of CRI  which is the likely source of mild anemia.  Pt said his [facility] doctor is working on his kidneys.  Final Clinical Impressions(s) / ED Diagnoses   Final diagnoses:  CRI (chronic renal insufficiency), stage 3 (moderate)  Anemia in stage 3 chronic kidney disease    New Prescriptions New Prescriptions   No medications on file   I personally performed the services described in this documentation, which was scribed in my presence. The recorded information has been reviewed and is accurate.    Isla Pence, MD 03/08/17 1017

## 2017-03-21 ENCOUNTER — Encounter (INDEPENDENT_AMBULATORY_CARE_PROVIDER_SITE_OTHER): Payer: Self-pay | Admitting: Internal Medicine

## 2017-04-04 ENCOUNTER — Other Ambulatory Visit (INDEPENDENT_AMBULATORY_CARE_PROVIDER_SITE_OTHER): Payer: Self-pay | Admitting: Internal Medicine

## 2017-04-04 ENCOUNTER — Encounter (INDEPENDENT_AMBULATORY_CARE_PROVIDER_SITE_OTHER): Payer: Self-pay

## 2017-04-04 ENCOUNTER — Encounter (INDEPENDENT_AMBULATORY_CARE_PROVIDER_SITE_OTHER): Payer: Self-pay | Admitting: *Deleted

## 2017-04-04 ENCOUNTER — Ambulatory Visit (INDEPENDENT_AMBULATORY_CARE_PROVIDER_SITE_OTHER): Payer: BLUE CROSS/BLUE SHIELD | Admitting: Internal Medicine

## 2017-04-04 ENCOUNTER — Encounter (INDEPENDENT_AMBULATORY_CARE_PROVIDER_SITE_OTHER): Payer: Self-pay | Admitting: Internal Medicine

## 2017-04-04 ENCOUNTER — Telehealth (INDEPENDENT_AMBULATORY_CARE_PROVIDER_SITE_OTHER): Payer: Self-pay | Admitting: *Deleted

## 2017-04-04 VITALS — BP 180/84 | HR 72 | Temp 97.7°F | Ht 71.0 in | Wt 195.7 lb

## 2017-04-04 DIAGNOSIS — D649 Anemia, unspecified: Secondary | ICD-10-CM | POA: Insufficient documentation

## 2017-04-04 DIAGNOSIS — D508 Other iron deficiency anemias: Secondary | ICD-10-CM | POA: Diagnosis not present

## 2017-04-04 MED ORDER — PEG 3350-KCL-NA BICARB-NACL 420 G PO SOLR: 4000.0000 mL | Freq: Once | ORAL | 0 refills | Status: AC

## 2017-04-04 NOTE — Telephone Encounter (Signed)
Patient needs trilyte 

## 2017-04-04 NOTE — Progress Notes (Signed)
   Subjective:    Patient ID: Omar Parker, male    DOB: 1946-06-07, 71 y.o.   MRN: [id number]  HPI  Referred by Dr. Gerarda Fraction for IDA. Per ED records, Guaiac negative in the ED. Hx of gastric ulcer in the 80s.  He states he is here for a colonoscopy. He has never had a colonoscopy. Last BM yesterday. Usually has a BM daily. Appetite is good. No weight loss. No abdominal pain  No family hx of colon cancer that he knows of.   H[facility] of CKD stage 3 [facility]  [facility] on the 19th of this month.   CBC    Component Value Date/Time   WBC 6.6 03/08/2017 0918   RBC 4.92 03/08/2017 0918   HGB 12.8 (L) 03/08/2017 0918   HCT 39.0 03/08/2017 0918   PLT 322 03/08/2017 0918   MCV 79.3 03/08/2017 0918   MCH 26.0 03/08/2017 0918   MCHC 32.8 03/08/2017 0918   RDW 16.1 (H) 03/08/2017 0918   LYMPHSABS 1.7 03/08/2017 0918   MONOABS 0.5 03/08/2017 0918   EOSABS 0.2 03/08/2017 0918   BASOSABS 0.1 03/08/2017 0918        03/02/2017 Hand H 11.8 and 36.5, MCV 78, ferritin 29, TIBC 301, UIBC 262, Iron 39, % Sat 13% Last colonoscopy.  Review of Systems Past Medical History:  Diagnosis Date  . Diabetes mellitus without complication ([place])   . High cholesterol   . Hypertension     Past Surgical History:  Procedure Laterality Date  . HERNIA REPAIR      No Known Allergies  Current Outpatient Prescriptions on File Prior to Visit  Medication Sig Dispense Refill  . aspirin 81 MG chewable tablet Chew 81 mg by mouth daily.    Marland Kitchen glipiZIDE (GLUCOTROL) 10 MG tablet Take 10 mg by mouth daily before breakfast.    . hydrochlorothiazide (HYDRODIURIL) 25 MG tablet Take 25 mg by mouth daily.    . insulin glargine (LANTUS) 100 UNIT/ML injection Inject 50 Units into the skin at bedtime.    Marland Kitchen lisinopril (PRINIVIL,ZESTRIL) 40 MG tablet Take 40 mg by mouth daily.    Marland Kitchen NIFEdipine (PROCARDIA XL/ADALAT-CC) 60 MG 24 hr tablet Take 60 mg by mouth daily.    . pravastatin  (PRAVACHOL) 40 MG tablet Take 20 mg by mouth at bedtime.     No current facility-administered medications on file prior to visit.         Objective:   Physical Exam Blood pressure (!) 180/84, pulse 72, temperature 97.7 F (36.5 C), height 5\' 11"  (1.803 m), weight 195 lb 11.2 oz (88.8 kg).  Alert and oriented. Skin warm and dry. Oral mucosa is moist.   . Sclera anicteric, conjunctivae is pink. Thyroid not enlarged. No cervical lymphadenopathy. Lungs clear. Heart regular rate and rhythm.  Abdomen is soft. Bowel sounds are positive. No hepatomegaly. No abdominal masses felt. No tenderness.  No edema to lower extremities.         Assessment & Plan:  IDA. Colonic neoplasm needs to be ruled out. Has never had a colonoscopy. Colonoscopy. The risks of bleeding, perforation and infection were reviewed with patient.

## 2017-04-04 NOTE — Patient Instructions (Signed)
The risks of bleeding, perforation and infection were reviewed with patient.  

## 2017-05-16 ENCOUNTER — Encounter (HOSPITAL_COMMUNITY)
Admission: RE | Disposition: A | Discharge status: Home / Self Care | Payer: Self-pay | Source: Ambulatory Visit | Attending: Internal Medicine

## 2017-05-16 ENCOUNTER — Ambulatory Visit (HOSPITAL_COMMUNITY)
Admission: RE | Admit: 2017-05-16 | Discharge: 2017-05-16 | Disposition: A | Discharge status: Home / Self Care | Payer: BLUE CROSS/BLUE SHIELD | Source: Ambulatory Visit | Attending: Internal Medicine | Admitting: Internal Medicine

## 2017-05-16 ENCOUNTER — Encounter (HOSPITAL_COMMUNITY): Payer: Self-pay | Admitting: *Deleted

## 2017-05-16 DIAGNOSIS — D649 Anemia, unspecified: Secondary | ICD-10-CM | POA: Insufficient documentation

## 2017-05-16 DIAGNOSIS — K644 Residual hemorrhoidal skin tags: Secondary | ICD-10-CM | POA: Insufficient documentation

## 2017-05-16 DIAGNOSIS — Z7982 Long term (current) use of aspirin: Secondary | ICD-10-CM | POA: Insufficient documentation

## 2017-05-16 DIAGNOSIS — K5730 Diverticulosis of large intestine without perforation or abscess without bleeding: Secondary | ICD-10-CM | POA: Insufficient documentation

## 2017-05-16 DIAGNOSIS — Z794 Long term (current) use of insulin: Secondary | ICD-10-CM | POA: Diagnosis not present

## 2017-05-16 DIAGNOSIS — E7800 Pure hypercholesterolemia, unspecified: Secondary | ICD-10-CM | POA: Insufficient documentation

## 2017-05-16 DIAGNOSIS — Z79899 Other long term (current) drug therapy: Secondary | ICD-10-CM | POA: Diagnosis not present

## 2017-05-16 DIAGNOSIS — Z8711 Personal history of peptic ulcer disease: Secondary | ICD-10-CM | POA: Insufficient documentation

## 2017-05-16 DIAGNOSIS — D509 Iron deficiency anemia, unspecified: Secondary | ICD-10-CM | POA: Diagnosis not present

## 2017-05-16 DIAGNOSIS — I10 Essential (primary) hypertension: Secondary | ICD-10-CM | POA: Insufficient documentation

## 2017-05-16 DIAGNOSIS — D508 Other iron deficiency anemias: Secondary | ICD-10-CM

## 2017-05-16 DIAGNOSIS — E119 Type 2 diabetes mellitus without complications: Secondary | ICD-10-CM | POA: Diagnosis not present

## 2017-05-16 HISTORY — PX: COLONOSCOPY: SHX5424

## 2017-05-16 HISTORY — DX: Anemia, unspecified: D64.9

## 2017-05-16 LAB — GLUCOSE, CAPILLARY: Glucose-Capillary: 260 mg/dL — ABNORMAL HIGH (ref 65–99)

## 2017-05-16 SURGERY — COLONOSCOPY
Anesthesia: Moderate Sedation

## 2017-05-16 MED ORDER — MEPERIDINE HCL 50 MG/ML IJ SOLN
INTRAMUSCULAR | Status: DC | PRN
  Administered 2017-05-16 (×2): 25 mg via INTRAVENOUS

## 2017-05-16 MED ORDER — MEPERIDINE HCL 50 MG/ML IJ SOLN
INTRAMUSCULAR | Status: AC
  Filled 2017-05-16: qty 1

## 2017-05-16 MED ORDER — MIDAZOLAM HCL 5 MG/5ML IJ SOLN
INTRAMUSCULAR | Status: DC | PRN
  Administered 2017-05-16 (×4): 2 mg via INTRAVENOUS

## 2017-05-16 MED ORDER — SODIUM CHLORIDE 0.9 % IV SOLN
INTRAVENOUS | Status: DC
  Administered 2017-05-16: 1000 mL via INTRAVENOUS

## 2017-05-16 MED ORDER — STERILE WATER FOR IRRIGATION IR SOLN
Status: DC | PRN
  Administered 2017-05-16: 08:00:00

## 2017-05-16 MED ORDER — FLINTSTONES PLUS IRON PO CHEW: 1.0000 | CHEWABLE_TABLET | Freq: Two times a day (BID) | ORAL | Status: DC

## 2017-05-16 MED ORDER — MIDAZOLAM HCL 5 MG/5ML IJ SOLN
INTRAMUSCULAR | Status: AC
  Filled 2017-05-16: qty 10

## 2017-05-16 NOTE — OR Nursing (Signed)
Gold wedding ring given to patient's wife Prentiss Bells.  She placed it in her purse

## 2017-05-16 NOTE — Discharge Instructions (Signed)
Resume usual medications including aspirin. Flintstone chewable with iron 1 tablet twice daily. High fiber diet. No driving for 24 hours. Call if you have rectal bleeding or tarry stool. Will check H&H in 8 weeks. Office will call.

## 2017-05-16 NOTE — H&P (Signed)
Omar Parker is an 71 y.o. male.   Chief Complaint: Patient is here for colonoscopy. HPI: Patient is 71 year old African-American male was recently found to have iron deficiency anemia. There is no history of melena or rectal bleeding. His stool was guaiac negative. He has remote history of peptic ulcer disease. He has not had any epigastric pain nausea or vomiting. He has good appetite. He is on low-dose aspirin and does not take other OTC NSAIDs. He has never been screened for CRC. Family history is negative for CRC.  Past Medical History:  Diagnosis Date  . Anemia    IDA  . Diabetes mellitus without complication ([place])   . High cholesterol   . Hypertension     Past Surgical History:  Procedure Laterality Date  . HERNIA REPAIR      History reviewed. No pertinent family history. Social History:  reports that he has never smoked. He has never used smokeless tobacco. He reports that he does not drink alcohol or use drugs.  Allergies: No Known Allergies  Medications Prior to Admission  Medication Sig Dispense Refill  . aspirin 81 MG chewable tablet Chew 81 mg by mouth daily.    Marland Kitchen glipiZIDE (GLUCOTROL) 10 MG tablet Take 10 mg by mouth daily before breakfast.    . hydrochlorothiazide (HYDRODIURIL) 25 MG tablet Take 25 mg by mouth daily.    . insulin glargine (LANTUS) 100 UNIT/ML injection Inject 50 Units into the skin at bedtime.    . insulin regular (NOVOLIN R,HUMULIN R) 100 units/mL injection Inject into the skin. 4 units before am and evening meal.    . lisinopril (PRINIVIL,ZESTRIL) 40 MG tablet Take 40 mg by mouth daily.    Marland Kitchen NIFEdipine (PROCARDIA XL/ADALAT-CC) 60 MG 24 hr tablet Take 60 mg by mouth daily.    . pravastatin (PRAVACHOL) 40 MG tablet Take 20 mg by mouth at bedtime.      Results for orders placed or performed during the hospital encounter of 05/16/17 (from the past 48 hour(s))  Glucose, capillary     Status: Abnormal   Collection Time: 05/16/17  6:57 AM  Result  Value Ref Range   Glucose-Capillary 260 (H) 65 - 99 mg/dL   No results found.  ROS  Blood pressure (!) 184/96, pulse 72, temperature 98.8 F (37.1 C), temperature source Oral, resp. rate 17, height 5\' 11"  (1.803 m), weight 195 lb (88.5 kg), SpO2 100 %. Physical Exam  Constitutional: He appears well-developed and well-nourished.  HENT:  Mouth/Throat: Oropharynx is clear and moist.  Eyes: Conjunctivae are normal. No scleral icterus.  Neck: No thyromegaly present.  Cardiovascular: Normal rate, regular rhythm and normal heart sounds.   No murmur heard. Respiratory: Effort normal and breath sounds normal.  GI: Soft. He exhibits no distension and no mass. There is no tenderness.  Musculoskeletal: He exhibits no edema.  Lymphadenopathy:    He has no cervical adenopathy.  Neurological: He is alert.  Skin: Skin is warm and dry.     Assessment/Plan Iron deficiency anemia. Diagnostic colonoscopy.  Hildred Laser, MD 05/16/2017, 7:33 AM

## 2017-05-16 NOTE — Op Note (Signed)
[facility] Patient Name: Omar Parker Procedure Date: 05/16/2017 7:04 AM MRN: [id number] Date of Birth: [birth year] Attending MD: Hildred Laser , MD CSN: [id number] Age: 71 Admit Type: Outpatient Procedure:                Colonoscopy Indications:              Unexplained iron deficiency anemia Providers:                Hildred Laser, MD, [place] B. Sharon Seller, RN, Aram Candela Referring MD:             Redmond School, MD Medicines:                Meperidine 50 mg IV, Midazolam 8 mg IV Complications:            No immediate complications. Estimated Blood Loss:     Estimated blood loss: none. Procedure:                Pre-Anesthesia Assessment:                           - Prior to the procedure, a History and Physical                            was performed, and patient medications and                            allergies were reviewed. The patient's tolerance of                            previous anesthesia was also reviewed. The risks                            and benefits of the procedure and the sedation                            options and risks were discussed with the patient.                            All questions were answered, and informed consent                            was obtained. Prior Anticoagulants: The patient                            last took aspirin 3 days prior to the procedure.                            ASA Grade Assessment: II - A patient with mild                            systemic disease. After reviewing the risks and  benefits, the patient was deemed in satisfactory                            condition to undergo the procedure.                           After obtaining informed consent, the colonoscope                            was passed under direct vision. Throughout the                            procedure, the patient's blood pressure, pulse, and                            oxygen  saturations were monitored continuously. The                            [id number] ([id number]) was introduced through the                            anus and advanced to the the cecum, identified by                            appendiceal orifice and ileocecal valve. The                            colonoscopy was performed without difficulty. The                            patient tolerated the procedure well. The quality                            of the bowel preparation was adequate. The                            ileocecal valve, appendiceal orifice, and rectum                            were photographed. Scope In: 7:44:39 AM Scope Out: 8:00:22 AM Scope Withdrawal Time: 0 hours 10 minutes 56 seconds  Total Procedure Duration: 0 hours 15 minutes 43 seconds  Findings:      The perianal and digital rectal examinations were normal.      Scattered medium-mouthed diverticula were found in the sigmoid colon.      The exam was otherwise normal throughout the examined colon.      External hemorrhoids were found during retroflexion. The hemorrhoids       were small. Impression:               - Diverticulosis in the sigmoid colon.                           - External hemorrhoids.                           -  No specimens collected. Moderate Sedation:      Moderate (conscious) sedation was administered by the endoscopy nurse       and supervised by the endoscopist. The following parameters were       monitored: oxygen saturation, heart rate, blood pressure, CO2       capnography and response to care. Total physician intraservice time was       23 minutes. Recommendation:           - Patient has a contact number available for                            emergencies. The signs and symptoms of potential                            delayed complications were discussed with the                            patient. Return to normal activities tomorrow.                            Written discharge  instructions were provided to the                            patient.                           - High fiber diet today.                           - Continue present medications.                           - Resume aspirin at prior dose today.                           - Flintstone chewable 1 tabletwice daily.                           - H&H in 8 weeks.                           - Call for rectal bleeding or melena.                           - Repeat colonoscopy in 10 years for screening                            purposes. Procedure Code(s):        --- Professional ---                           [phone], Colonoscopy, flexible; diagnostic, including                            collection of specimen(s) by brushing or washing,  when performed (separate procedure)                           99152, Moderate sedation services provided by the                            same physician or other qualified health care                            professional performing the diagnostic or                            therapeutic service that the sedation supports,                            requiring the presence of an independent trained                            observer to assist in the monitoring of the                            patient's level of consciousness and physiological                            status; initial 15 minutes of intraservice time,                            patient age 2 years or older                           [phone], Moderate sedation services; each additional                            15 minutes intraservice time Diagnosis Code(s):        --- Professional ---                           K64.4, Residual hemorrhoidal skin tags                           D50.9, Iron deficiency anemia, unspecified                           K57.30, Diverticulosis of large intestine without                            perforation or abscess without bleeding CPT copyright 2016  American Medical Association. All rights reserved. The codes documented in this report are preliminary and upon coder review may  be revised to meet current compliance requirements. Hildred Laser, MD Hildred Laser, MD 05/16/2017 8:08:30 AM This report has been signed electronically. Number of Addenda: 0

## 2017-05-21 ENCOUNTER — Encounter (HOSPITAL_COMMUNITY): Payer: Self-pay | Admitting: Internal Medicine

## 2017-11-12 ENCOUNTER — Emergency Department (HOSPITAL_COMMUNITY)
Admission: EM | Admit: 2017-11-12 | Discharge: 2017-11-12 | Disposition: A | Discharge status: Home / Self Care | Payer: BLUE CROSS/BLUE SHIELD | Attending: Emergency Medicine | Admitting: Emergency Medicine

## 2017-11-12 ENCOUNTER — Other Ambulatory Visit: Payer: Self-pay

## 2017-11-12 ENCOUNTER — Encounter (HOSPITAL_COMMUNITY): Payer: Self-pay | Admitting: Emergency Medicine

## 2017-11-12 DIAGNOSIS — Z7982 Long term (current) use of aspirin: Secondary | ICD-10-CM | POA: Diagnosis not present

## 2017-11-12 DIAGNOSIS — E1122 Type 2 diabetes mellitus with diabetic chronic kidney disease: Secondary | ICD-10-CM | POA: Diagnosis not present

## 2017-11-12 DIAGNOSIS — Z79899 Other long term (current) drug therapy: Secondary | ICD-10-CM | POA: Diagnosis not present

## 2017-11-12 DIAGNOSIS — N183 Chronic kidney disease, stage 3 (moderate): Secondary | ICD-10-CM | POA: Insufficient documentation

## 2017-11-12 DIAGNOSIS — I10 Essential (primary) hypertension: Secondary | ICD-10-CM

## 2017-11-12 DIAGNOSIS — I129 Hypertensive chronic kidney disease with stage 1 through stage 4 chronic kidney disease, or unspecified chronic kidney disease: Secondary | ICD-10-CM | POA: Insufficient documentation

## 2017-11-12 DIAGNOSIS — Z794 Long term (current) use of insulin: Secondary | ICD-10-CM | POA: Diagnosis not present

## 2017-11-12 DIAGNOSIS — R51 Headache: Secondary | ICD-10-CM | POA: Diagnosis present

## 2017-11-12 MED ORDER — AMLODIPINE BESYLATE 5 MG PO TABS: 5.0000 mg | ORAL_TABLET | Freq: Every day | ORAL | 1 refills | Status: DC

## 2017-11-12 MED ORDER — CLONIDINE HCL 0.2 MG PO TABS
0.2000 mg | ORAL_TABLET | Freq: Once | ORAL | Status: AC
  Administered 2017-11-12: 0.2 mg via ORAL
  Filled 2017-11-12: qty 1

## 2017-11-12 NOTE — ED Notes (Signed)
CN L. Cardwell was able to contact wife to get medications.

## 2017-11-12 NOTE — ED Triage Notes (Signed)
Pt c/o head "pressure" x 1 week. No better or worse. States had never had a headache. States took ibuprofen and helped with pain but pain came back. Denies any weakness on one side more than other,denies trouble swallowing/slurred speech/dizziness or confusion. Nad.

## 2017-11-12 NOTE — ED Provider Notes (Signed)
[facility] EMERGENCY DEPARTMENT Provider Note   CSN: [id number] Arrival date & time: 11/12/17  8756     History   Chief Complaint Chief Complaint  Patient presents with  . Headache    HPI Omar Parker is a 72 y.o. male.  Patient presents with elevated blood pressure and left frontal headache for several days.  He has a history of hypertension and takes hydrochlorothiazide 25 mg daily and lisinopril 40 mg daily.  He was on nifedipine 90 mg extended release, but this was discontinued.  He has taken ibuprofen for the headache with minimal relief.  No weakness, facial asymmetry, confusion, dysphasia.  His primary care doctor is in the [place] system.  Severity of symptoms is moderate.      Past Medical History:  Diagnosis Date  . Anemia    IDA  . Diabetes mellitus without complication ([place])   . High cholesterol   . Hypertension     Patient Active Problem List   Diagnosis Date Noted  . Absolute anemia 04/04/2017  . AKI (acute kidney injury) ([place]) 06/17/2016  . DM (diabetes mellitus screen) 06/17/2016  . Dehydration 06/17/2016  . CKD (chronic kidney disease), stage III ([place]) 06/17/2016    Past Surgical History:  Procedure Laterality Date  . COLONOSCOPY N/A 05/16/2017   Procedure: COLONOSCOPY;  Surgeon: Rogene Houston, MD;  [facility];  Service: Endoscopy;  Laterality: N/A;  7:30  . HERNIA REPAIR         Home Medications    Prior to Admission medications   Medication Sig Start Date End Date Taking? Authorizing Provider  aspirin 81 MG chewable tablet Chew 81 mg by mouth daily.   Yes [provider]  diclofenac (VOLTAREN) 75 MG EC tablet Take 1 tablet by mouth 2 (two) times daily. 10/25/17  Yes [provider]  glipiZIDE (GLUCOTROL) 10 MG tablet Take 10 mg by mouth daily before breakfast.   Yes [provider]  hydrochlorothiazide (HYDRODIURIL) 25 MG tablet Take 25 mg by mouth daily.   Yes [provider]  insulin  glargine (LANTUS) 100 UNIT/ML injection Inject 50 Units into the skin at bedtime.   Yes [provider]  insulin regular (NOVOLIN R,HUMULIN R) 100 units/mL injection Inject into the skin. 4 units before am and evening meal.   Yes [provider]  lisinopril (PRINIVIL,ZESTRIL) 40 MG tablet Take 40 mg by mouth daily.   Yes [provider]  NIFEdipine (PROCARDIA XL/ADALAT-CC) 60 MG 24 hr tablet Take 60 mg by mouth daily.   Yes [provider]  Pediatric Multivitamins-Iron (FLINTSTONES PLUS IRON) chewable tablet Chew 1 tablet by mouth 2 (two) times daily. 05/16/17  Yes Rehman, Mechele Dawley, MD  pravastatin (PRAVACHOL) 40 MG tablet Take 20 mg by mouth at bedtime.   Yes [provider]  amLODipine (NORVASC) 5 MG tablet Take 1 tablet (5 mg total) by mouth daily. 11/12/17   Nat Christen, MD    Family History History reviewed. No pertinent family history.  Social History Social History   Tobacco Use  . Smoking status: Never Smoker  . Smokeless tobacco: Never Used  Substance Use Topics  . Alcohol use: No  . Drug use: No     Allergies   Patient has no known allergies.   Review of Systems Review of Systems  All other systems reviewed and are negative.    Physical Exam Updated Vital Signs BP (!) 176/83 (BP Location: Right Arm)   Pulse 72  Temp 97.9 F (36.6 C) (Oral)   Resp 18   Ht 5\' 11"  (1.803 m)   Wt 86.2 kg (190 lb)   SpO2 97%   BMI 26.50 kg/m   Physical Exam  Constitutional: He is oriented to person, place, and time. He appears well-developed and well-nourished.  Blood pressure elevated; no acute distress  HENT:  Head: Normocephalic and atraumatic.  Eyes: Conjunctivae are normal.  Neck: Neck supple.  Cardiovascular: Normal rate and regular rhythm.  Pulmonary/Chest: Effort normal and breath sounds normal.  Abdominal: Soft. Bowel sounds are normal.  Musculoskeletal: Normal range of motion.  Neurological: He is alert and oriented  to person, place, and time.  Skin: Skin is warm and dry.  Psychiatric: He has a normal mood and affect. His behavior is normal.  Nursing note and vitals reviewed.    ED Treatments / Results  Labs (all labs ordered are listed, but only abnormal results are displayed) Labs Reviewed - No data to display  EKG  EKG Interpretation None       Radiology No results found.  Procedures Procedures (including critical care time)  Medications Ordered in ED Medications  cloNIDine (CATAPRES) tablet 0.2 mg (0.2 mg Oral Given 11/12/17 0951)     Initial Impression / Assessment and Plan / ED Course  I have reviewed the triage vital signs and the nursing notes.  Pertinent labs & imaging results that were available during my care of the patient were reviewed by me and considered in my medical decision making (see chart for details).     Patient has a known history of hypertension.  No evidence of neurological deficits.  He responded well to clonidine 0.2 mg.  We will add Norvasc 5 mg daily to his regimen.  He will follow-up with the [place] system.  Final Clinical Impressions(s) / ED Diagnoses   Final diagnoses:  Hypertension, unspecified type    ED Discharge Orders        Ordered    amLODipine (NORVASC) 5 MG tablet  Daily     11/12/17 1253       Nat Christen, MD 11/12/17 1258

## 2017-11-12 NOTE — Discharge Instructions (Signed)
Discontinue the blood pressure medicine called nifedipine.   Start a new medicine Norvasc or Amlodipine.  Take Tylenol for headache.  Follow-up with your primary care doctor.  Recommend a blood pressure log

## 2019-02-16 ENCOUNTER — Other Ambulatory Visit: Payer: Self-pay

## 2019-02-16 ENCOUNTER — Emergency Department (HOSPITAL_COMMUNITY)
Admission: EM | Admit: 2019-02-16 | Discharge: 2019-02-16 | Disposition: A | Discharge status: Home / Self Care | Payer: BLUE CROSS/BLUE SHIELD | Attending: Emergency Medicine | Admitting: Emergency Medicine

## 2019-02-16 ENCOUNTER — Encounter (HOSPITAL_COMMUNITY): Payer: Self-pay | Admitting: *Deleted

## 2019-02-16 DIAGNOSIS — E1122 Type 2 diabetes mellitus with diabetic chronic kidney disease: Secondary | ICD-10-CM | POA: Insufficient documentation

## 2019-02-16 DIAGNOSIS — H6123 Impacted cerumen, bilateral: Secondary | ICD-10-CM | POA: Diagnosis not present

## 2019-02-16 DIAGNOSIS — N183 Chronic kidney disease, stage 3 (moderate): Secondary | ICD-10-CM | POA: Diagnosis not present

## 2019-02-16 DIAGNOSIS — Z794 Long term (current) use of insulin: Secondary | ICD-10-CM | POA: Diagnosis not present

## 2019-02-16 DIAGNOSIS — Z79899 Other long term (current) drug therapy: Secondary | ICD-10-CM | POA: Insufficient documentation

## 2019-02-16 DIAGNOSIS — Z7982 Long term (current) use of aspirin: Secondary | ICD-10-CM | POA: Diagnosis not present

## 2019-02-16 DIAGNOSIS — I129 Hypertensive chronic kidney disease with stage 1 through stage 4 chronic kidney disease, or unspecified chronic kidney disease: Secondary | ICD-10-CM | POA: Diagnosis not present

## 2019-02-16 DIAGNOSIS — H9209 Otalgia, unspecified ear: Secondary | ICD-10-CM | POA: Diagnosis present

## 2019-02-16 MED ORDER — HYDROGEN PEROXIDE 3 % EX SOLN
CUTANEOUS | Status: AC
  Filled 2019-02-16: qty 473

## 2019-02-16 NOTE — ED Triage Notes (Signed)
Pt with bilateral ear feeling like they are stopped up for past few days. Pt admits to using q-tips

## 2019-02-16 NOTE — ED Provider Notes (Signed)
[facility] EMERGENCY DEPARTMENT Provider Note   CSN: [id number] Arrival date & time: 02/16/19  1346    History   Chief Complaint Chief Complaint  Patient presents with  . Otalgia    HPI Omar Parker is a 73 y.o. male.     HPI   Omar Parker is a 73 y.o. male who presents to the Emergency Department requesting evaluation of bilateral ear pain and decreased hearing.  States his ears feel "stopped up" Symptoms have been present for several days.  He reports history of ear wax build-up and current symptoms feel similar.  He has been using Q-tips without relief.  He denies fever, headache, dizziness, facial weakness and visual changes.  No hx of trauma   Past Medical History:  Diagnosis Date  . Anemia    IDA  . Diabetes mellitus without complication ([place])   . High cholesterol   . Hypertension     Patient Active Problem List   Diagnosis Date Noted  . Absolute anemia 04/04/2017  . AKI (acute kidney injury) ([place]) 06/17/2016  . DM (diabetes mellitus screen) 06/17/2016  . Dehydration 06/17/2016  . CKD (chronic kidney disease), stage III ([place]) 06/17/2016    Past Surgical History:  Procedure Laterality Date  . COLONOSCOPY N/A 05/16/2017   Procedure: COLONOSCOPY;  Surgeon: Rogene Houston, MD;  [facility];  Service: Endoscopy;  Laterality: N/A;  7:30  . HERNIA REPAIR          Home Medications    Prior to Admission medications   Medication Sig Start Date End Date Taking? Authorizing Provider  amLODipine (NORVASC) 5 MG tablet Take 1 tablet (5 mg total) by mouth daily. 11/12/17   Nat Christen, MD  aspirin 81 MG chewable tablet Chew 81 mg by mouth daily.    [provider]  diclofenac (VOLTAREN) 75 MG EC tablet Take 1 tablet by mouth 2 (two) times daily. 10/25/17   [provider]  glipiZIDE (GLUCOTROL) 10 MG tablet Take 10 mg by mouth daily before breakfast.    [provider]  hydrochlorothiazide (HYDRODIURIL) 25 MG tablet Take 25  mg by mouth daily.    [provider]  insulin glargine (LANTUS) 100 UNIT/ML injection Inject 50 Units into the skin at bedtime.    [provider]  insulin regular (NOVOLIN R,HUMULIN R) 100 units/mL injection Inject into the skin. 4 units before am and evening meal.    [provider]  lisinopril (PRINIVIL,ZESTRIL) 40 MG tablet Take 40 mg by mouth daily.    [provider]  NIFEdipine (PROCARDIA XL/ADALAT-CC) 60 MG 24 hr tablet Take 60 mg by mouth daily.    [provider]  Pediatric Multivitamins-Iron (FLINTSTONES PLUS IRON) chewable tablet Chew 1 tablet by mouth 2 (two) times daily. 05/16/17   Rogene Houston, MD  pravastatin (PRAVACHOL) 40 MG tablet Take 20 mg by mouth at bedtime.    [provider]    Family History History reviewed. No pertinent family history.  Social History Social History   Tobacco Use  . Smoking status: Never Smoker  . Smokeless tobacco: Never Used  Substance Use Topics  . Alcohol use: No  . Drug use: No     Allergies   Patient has no known allergies.   Review of Systems Review of Systems  Constitutional: Negative for chills and fever.  HENT: Positive for ear pain and hearing loss. Negative for congestion, facial swelling, sore throat and trouble swallowing.   Eyes:  Negative for visual disturbance.  Respiratory: Negative for chest tightness and shortness of breath.   Cardiovascular: Negative for chest pain.  Gastrointestinal: Negative for nausea and vomiting.  Skin: Negative for rash.  Neurological: Negative for dizziness, syncope, facial asymmetry, speech difficulty, numbness and headaches.     Physical Exam Updated Vital Signs BP (!) 178/99   Pulse 70   Temp 98 F (36.7 C) (Oral)   Resp 16   Ht 5\' 11"  (1.803 m)   Wt 89.8 kg   SpO2 95%   BMI 27.62 kg/m   Physical Exam Vitals signs and nursing note reviewed.  Constitutional:      General: He is not in acute distress.     Appearance: Normal appearance.  HENT:     Right Ear: There is impacted cerumen.     Left Ear: Decreased hearing noted. There is impacted cerumen.     Ears:     Comments: Cerumen impaction bilaterally.  Unable to visualize TM's.      Mouth/Throat:     Mouth: Mucous membranes are moist.     Pharynx: Oropharynx is clear.  Neck:     Musculoskeletal: Normal range of motion. No muscular tenderness.  Cardiovascular:     Rate and Rhythm: Normal rate and regular rhythm.  Pulmonary:     Effort: Pulmonary effort is normal. No respiratory distress.     Breath sounds: Normal breath sounds.  Musculoskeletal: Normal range of motion.  Lymphadenopathy:     Cervical: No cervical adenopathy.  Skin:    General: Skin is warm.     Findings: No rash.  Neurological:     General: No focal deficit present.     Mental Status: He is alert.      ED Treatments / Results  Labs (all labs ordered are listed, but only abnormal results are displayed) Labs Reviewed - No data to display  EKG None  Radiology No results found.  Procedures Procedures (including critical care time)  Medications Ordered in ED Medications  hydrogen peroxide 3 % external solution (has no administration in time range)     Initial Impression / Assessment and Plan / ED Course  I have reviewed the triage vital signs and the nursing notes.  Pertinent labs & imaging results that were available during my care of the patient were reviewed by me and considered in my medical decision making (see chart for details).       Pt well appearing. Bilateral cerumen impaction on exam.   Ears irrigated by nursing with saline and peroxide.    1435  On recheck, patient reports feeling much better and hearing now restored.  On my repeat exam, mild cerumen remains in the right ear canal, but TM visualized and nml appearing.  Left ear canal clear and TM also nml appearing.  Pt also seen by Dr. Eulis Foster    Final Clinical Impressions(s) / ED  Diagnoses   Final diagnoses:  Bilateral impacted cerumen    ED Discharge Orders    None       Kem Parkinson, PA-C 02/16/19 1448    Daleen Bo, MD 02/17/19 1022

## 2019-02-16 NOTE — ED Provider Notes (Signed)
  Face-to-face evaluation   History: He complains of a full sensation in his ears, with decreased hearing right greater than left, for several days.  He thought he had allergies and tried clearing his ears with Q-tips, without improvement.  He denies fever, chills, nausea or vomiting.  He denies dizziness.  Physical exam: Alert, calm, cooperative.  Bilateral external auditory canals are occluded with wax.  Tenderness of either pinna or tragus.  Oropharynx without lesions.  Medical screening examination/treatment/procedure(s) were conducted as a shared visit with non-physician practitioner(s) and myself.  I personally evaluated the patient during the encounter    Daleen Bo, MD 02/17/19 1022

## 2019-02-16 NOTE — ED Notes (Signed)
Pt presents with ear wax impaction   Admits to using Q tips to clean ear wax  Mandi, RN in to irrigate ears to remove impacted cerumen

## 2019-02-16 NOTE — Discharge Instructions (Addendum)
Follow up if needed

## 2019-11-24 ENCOUNTER — Emergency Department (HOSPITAL_COMMUNITY)
Admission: EM | Admit: 2019-11-24 | Discharge: 2019-11-24 | Disposition: A | Discharge status: Home / Self Care | Payer: BC Managed Care – PPO | Attending: Emergency Medicine | Admitting: Emergency Medicine

## 2019-11-24 ENCOUNTER — Encounter (HOSPITAL_COMMUNITY): Payer: Self-pay | Admitting: *Deleted

## 2019-11-24 ENCOUNTER — Other Ambulatory Visit: Payer: Self-pay

## 2019-11-24 ENCOUNTER — Emergency Department (HOSPITAL_COMMUNITY): Payer: BC Managed Care – PPO

## 2019-11-24 DIAGNOSIS — N183 Chronic kidney disease, stage 3 unspecified: Secondary | ICD-10-CM | POA: Insufficient documentation

## 2019-11-24 DIAGNOSIS — E1122 Type 2 diabetes mellitus with diabetic chronic kidney disease: Secondary | ICD-10-CM | POA: Diagnosis not present

## 2019-11-24 DIAGNOSIS — N179 Acute kidney failure, unspecified: Secondary | ICD-10-CM | POA: Insufficient documentation

## 2019-11-24 DIAGNOSIS — I129 Hypertensive chronic kidney disease with stage 1 through stage 4 chronic kidney disease, or unspecified chronic kidney disease: Secondary | ICD-10-CM | POA: Insufficient documentation

## 2019-11-24 DIAGNOSIS — R0789 Other chest pain: Secondary | ICD-10-CM | POA: Diagnosis present

## 2019-11-24 DIAGNOSIS — R072 Precordial pain: Secondary | ICD-10-CM | POA: Diagnosis not present

## 2019-11-24 LAB — CBC WITH DIFFERENTIAL/PLATELET
Abs Immature Granulocytes: 0.02 10*3/uL (ref 0.00–0.07)
Basophils Absolute: 0.1 10*3/uL (ref 0.0–0.1)
Basophils Relative: 1 %
Eosinophils Absolute: 0.2 10*3/uL (ref 0.0–0.5)
Eosinophils Relative: 2 %
HCT: 33.6 % — ABNORMAL LOW (ref 39.0–52.0)
Hemoglobin: 10.7 g/dL — ABNORMAL LOW (ref 13.0–17.0)
Immature Granulocytes: 0 %
Lymphocytes Relative: 18 %
Lymphs Abs: 1.6 10*3/uL (ref 0.7–4.0)
MCH: 25.7 pg — ABNORMAL LOW (ref 26.0–34.0)
MCHC: 31.8 g/dL (ref 30.0–36.0)
MCV: 80.6 fL (ref 80.0–100.0)
Monocytes Absolute: 0.7 10*3/uL (ref 0.1–1.0)
Monocytes Relative: 8 %
Neutro Abs: 6 10*3/uL (ref 1.7–7.7)
Neutrophils Relative %: 71 %
Platelets: 337 10*3/uL (ref 150–400)
RBC: 4.17 MIL/uL — ABNORMAL LOW (ref 4.22–5.81)
RDW: 15.8 % — ABNORMAL HIGH (ref 11.5–15.5)
WBC: 8.5 10*3/uL (ref 4.0–10.5)
nRBC: 0 % (ref 0.0–0.2)

## 2019-11-24 LAB — COMPREHENSIVE METABOLIC PANEL
ALT: 12 U/L (ref 0–44)
AST: 17 U/L (ref 15–41)
Albumin: 3.3 g/dL — ABNORMAL LOW (ref 3.5–5.0)
Alkaline Phosphatase: 52 U/L (ref 38–126)
Anion gap: 10 (ref 5–15)
BUN: 43 mg/dL — ABNORMAL HIGH (ref 8–23)
CO2: 24 mmol/L (ref 22–32)
Calcium: 8.2 mg/dL — ABNORMAL LOW (ref 8.9–10.3)
Chloride: 104 mmol/L (ref 98–111)
Creatinine, Ser: 3.9 mg/dL — ABNORMAL HIGH (ref 0.61–1.24)
GFR calc Af Amer: 17 mL/min — ABNORMAL LOW (ref >60)
GFR calc non Af Amer: 15 mL/min — ABNORMAL LOW (ref >60)
Glucose, Bld: 117 mg/dL — ABNORMAL HIGH (ref 70–99)
Potassium: 3.3 mmol/L — ABNORMAL LOW (ref 3.5–5.1)
Sodium: 138 mmol/L (ref 135–145)
Total Bilirubin: 0.4 mg/dL (ref 0.3–1.2)
Total Protein: 8 g/dL (ref 6.5–8.1)

## 2019-11-24 LAB — TROPONIN I (HIGH SENSITIVITY)
Troponin I (High Sensitivity): 15 ng/L (ref <18)
Troponin I (High Sensitivity): 14 ng/L (ref <18)

## 2019-11-24 LAB — LIPASE, BLOOD: Lipase: 29 U/L (ref 11–51)

## 2019-11-24 MED ORDER — ALUM & MAG HYDROXIDE-SIMETH 200-200-20 MG/5ML PO SUSP
15.0000 mL | Freq: Once | ORAL | Status: AC
  Administered 2019-11-24: 12:00:00 15 mL via ORAL
  Filled 2019-11-24: qty 30

## 2019-11-24 NOTE — ED Provider Notes (Signed)
Emergency Department Provider Note   I have reviewed the triage vital signs and the nursing notes.   HISTORY  Chief Complaint Nausea   HPI Omar Parker is a 74 y.o. male with PMH of HLD, HTN, and DM presents to the emergency department for evaluation of nausea with burning type pain in the chest.  Patient tells me that he has had a bitter taste in his mouth for the past week and is not improving.  He does take Alka-Seltzer and Tums and states it makes it feel slightly better but not completely.  He has been drinking milk and states that this improves his pain significantly but then it returns.  He denies shortness of breath, fever, cough, chills.  He does note history of kidney disease in the past and is followed by the [facility] for this.  Past Medical History:  Diagnosis Date  . Anemia    IDA  . Diabetes mellitus without complication ([place])   . High cholesterol   . Hypertension     Patient Active Problem List   Diagnosis Date Noted  . Absolute anemia 04/04/2017  . AKI (acute kidney injury) ([place]) 06/17/2016  . DM (diabetes mellitus screen) 06/17/2016  . Dehydration 06/17/2016  . CKD (chronic kidney disease), stage III 06/17/2016    Past Surgical History:  Procedure Laterality Date  . COLONOSCOPY N/A 05/16/2017   Procedure: COLONOSCOPY;  Surgeon: Rogene Houston, MD;  Location[facility];  Service: Endoscopy;  Laterality: N/A;  7:30  . HERNIA REPAIR      Allergies Patient has no known allergies.  History reviewed. No pertinent family history.  Social History Social History   Tobacco Use  . Smoking status: Never Smoker  . Smokeless tobacco: Never Used  Substance Use Topics  . Alcohol use: No  . Drug use: No    Review of Systems  Constitutional: No fever/chills Eyes: No visual changes. ENT: No sore throat. Cardiovascular: Denies chest pain. Respiratory: Denies shortness of breath. Gastrointestinal: No abdominal pain.  No nausea, no vomiting.  No diarrhea.   No constipation. Genitourinary: Negative for dysuria. Musculoskeletal: Negative for back pain. Skin: Negative for rash. Neurological: Negative for headaches, focal weakness or numbness.  10-point ROS otherwise negative.  ____________________________________________   PHYSICAL EXAM:  VITAL SIGNS: ED Triage Vitals  Enc Vitals Group     BP 11/24/19 1123 (!) 179/82     Pulse Rate 11/24/19 1123 74     Resp 11/24/19 1123 20     Temp 11/24/19 1123 97.9 F (36.6 C)     Temp src --      SpO2 11/24/19 1123 100 %     Weight 11/24/19 1123 197 lb (89.4 kg)     Height 11/24/19 1123 5\' 11"  (1.803 m)   Constitutional: Alert and oriented. Well appearing and in no acute distress. Eyes: Conjunctivae are normal.  Head: Atraumatic. Nose: No congestion/rhinnorhea. Mouth/Throat: Mucous membranes are moist. Neck: No stridor.   Cardiovascular: Normal rate, regular rhythm. Good peripheral circulation. Grossly normal heart sounds.   Respiratory: Normal respiratory effort.  No retractions. Lungs CTAB. Gastrointestinal: Soft and nontender. No distention.  Musculoskeletal: No gross deformities of extremities. Neurologic:  Normal speech and language.  Skin:  Skin is warm, dry and intact. No rash noted.   ____________________________________________   LABS (all labs ordered are listed, but only abnormal results are displayed)  Labs Reviewed  COMPREHENSIVE METABOLIC PANEL - Abnormal; Notable for the following components:  Result Value   Potassium 3.3 (*)    Glucose, Bld 117 (*)    BUN 43 (*)    Creatinine, Ser 3.90 (*)    Calcium 8.2 (*)    Albumin 3.3 (*)    GFR calc non Af Amer 15 (*)    GFR calc Af Amer 17 (*)    All other components within normal limits  CBC WITH DIFFERENTIAL/PLATELET - Abnormal; Notable for the following components:   RBC 4.17 (*)    Hemoglobin 10.7 (*)    HCT 33.6 (*)    MCH 25.7 (*)    RDW 15.8 (*)    All other components within normal limits  LIPASE, BLOOD   TROPONIN I (HIGH SENSITIVITY)  TROPONIN I (HIGH SENSITIVITY)   ____________________________________________  EKG   EKG Interpretation  Date/Time:  Monday November 24 2019 12:13:38 EST Ventricular Rate:  72 PR Interval:    QRS Duration: 101 QT Interval:  429 QTC Calculation: 470 R Axis:   51 Text Interpretation: Sinus rhythm Borderline repolarization abnormality T wave inversions new from 2017 No STEMI Confirmed by Nanda Quinton [phone]) on 11/24/2019 12:20:47 PM       ____________________________________________  RADIOLOGY  DG Chest Portable 1 View  Result Date: 11/24/2019 CLINICAL DATA:  Chest pain EXAM: PORTABLE CHEST 1 VIEW COMPARISON:  Mar 23, 2008 FINDINGS: Lungs are clear. Heart is upper normal in size with pulmonary vascularity normal. No adenopathy. Mild fullness in the right paratracheal region is felt to be due to great vessel prominence. No pneumothorax. No bone lesions. IMPRESSION: Lungs clear.  Heart upper normal in size.  No adenopathy. Electronically Signed   By: Lowella Grip III M.D.   On: 11/24/2019 12:26    ____________________________________________   PROCEDURES  Procedure(s) performed:   Procedures  None  ____________________________________________   INITIAL IMPRESSION / ASSESSMENT AND PLAN / ED COURSE  Pertinent labs & imaging results that were available during my care of the patient were reviewed by me and considered in my medical decision making (see chart for details).   Patient presents to the emergency department with chest discomfort most consistent with reflux.  EKG, troponin, additional lab work reviewed with no acute ischemic findings.  The patient's creatinine today is 3.9 which is significantly higher than our labs from 2018.  I am attempting to get additional blood work from the [place] which may confirm either that this is chronic or new.  If new would consider admission for further trending and evaluation along with nephrology consult.   Care transferred to Dr. Rogene Houston pending labs from the [facility] and reassess.   ____________________________________________  FINAL CLINICAL IMPRESSION(S) / ED DIAGNOSES  Final diagnoses:  Precordial pain  AKI (acute kidney injury) ([place])    MEDICATIONS GIVEN DURING THIS VISIT:  Medications  alum & mag hydroxide-simeth (MAALOX/MYLANTA) 200-200-20 MG/5ML suspension 15 mL (15 mLs Oral Given 11/24/19 1219)    Note:  This document was prepared using Dragon voice recognition software and may include unintentional dictation errors.  Nanda Quinton, MD, [facility] Emergency Medicine    Laurier Jasperson, [facility], MD 11/25/19 [phone]

## 2019-11-24 NOTE — ED Triage Notes (Signed)
C/o nausea and bitter taste in his mouth for the last week, states it is not getting any better, taking alka seltzer and tums without relief

## 2019-11-24 NOTE — Discharge Instructions (Addendum)
As we discussed recommended admission for the worsening of the renal function.  But understand that she wanted follow-up with the [place].  Have printed your lab results from here.  It is possible this has not been a significant change we just do not have anything for comparison.  Work-up for the chest pain no evidence of being from the heart.  So safe for discharge from that standpoint.  Could be reflux disease recommend over-the-counter Pepcid.  Very important that you follow-up with the [place] regarding the renal function.  Return for any new or worse symptoms

## 2019-11-24 NOTE — ED Notes (Signed)
Finally spoke to someone in Medical Records at [place]. Request form sent, now waiting on faxed request.

## 2019-11-24 NOTE — ED Provider Notes (Signed)
Patient's second troponin came back without any significant changes.  Regarding his kidney function which looks significantly worse than about a year ago patient states he has not had any lab work done at [facility] for about a year.  Recommended admission based on the acute kidney injury although some of it could be chronic.  Potassium not significantly elevated.  Patient does not want to be admitted he will follow-up with the [place].  They have been following his kidney function closely.  Patient given lab results to take to them.  Regarding the chest discomfort probably reflux in nature.  Recommend Pepcid.  Patient said to get over-the-counter Pepcid.  No significant change in his delta troponins.   Fredia Sorrow, MD 11/24/19 1754

## 2019-11-24 NOTE — ED Notes (Signed)
STILL TR[facility]ACH [facility] [place]. ESPECIALLY MEDICAL RECORDDS.

## 2019-12-20 ENCOUNTER — Encounter (HOSPITAL_COMMUNITY): Payer: Self-pay

## 2019-12-20 ENCOUNTER — Emergency Department (HOSPITAL_COMMUNITY): Payer: BC Managed Care – PPO

## 2019-12-20 ENCOUNTER — Emergency Department (HOSPITAL_COMMUNITY)
Admission: EM | Admit: 2019-12-20 | Discharge: 2019-12-20 | Disposition: A | Discharge status: Home / Self Care | Payer: BC Managed Care – PPO | Attending: Emergency Medicine | Admitting: Emergency Medicine

## 2019-12-20 ENCOUNTER — Other Ambulatory Visit: Payer: Self-pay

## 2019-12-20 DIAGNOSIS — E119 Type 2 diabetes mellitus without complications: Secondary | ICD-10-CM | POA: Insufficient documentation

## 2019-12-20 DIAGNOSIS — Z7982 Long term (current) use of aspirin: Secondary | ICD-10-CM | POA: Diagnosis not present

## 2019-12-20 DIAGNOSIS — Z79899 Other long term (current) drug therapy: Secondary | ICD-10-CM | POA: Insufficient documentation

## 2019-12-20 DIAGNOSIS — R110 Nausea: Secondary | ICD-10-CM | POA: Diagnosis present

## 2019-12-20 DIAGNOSIS — Z20822 Contact with and (suspected) exposure to covid-19: Secondary | ICD-10-CM | POA: Diagnosis not present

## 2019-12-20 DIAGNOSIS — I129 Hypertensive chronic kidney disease with stage 1 through stage 4 chronic kidney disease, or unspecified chronic kidney disease: Secondary | ICD-10-CM | POA: Diagnosis not present

## 2019-12-20 DIAGNOSIS — N183 Chronic kidney disease, stage 3 unspecified: Secondary | ICD-10-CM | POA: Diagnosis not present

## 2019-12-20 LAB — URINALYSIS, ROUTINE W REFLEX MICROSCOPIC
Bacteria, UA: NONE SEEN
Bilirubin Urine: NEGATIVE
Glucose, UA: 50 mg/dL — AB
Hgb urine dipstick: NEGATIVE
Ketones, ur: NEGATIVE mg/dL
Leukocytes,Ua: NEGATIVE
Nitrite: NEGATIVE
Protein, ur: >=300 mg/dL — AB
Specific Gravity, Urine: 1.012 (ref 1.005–1.030)
pH: 7 (ref 5.0–8.0)

## 2019-12-20 LAB — COMPREHENSIVE METABOLIC PANEL
ALT: 15 U/L (ref 0–44)
AST: 18 U/L (ref 15–41)
Albumin: 3.2 g/dL — ABNORMAL LOW (ref 3.5–5.0)
Alkaline Phosphatase: 52 U/L (ref 38–126)
Anion gap: 10 (ref 5–15)
BUN: 37 mg/dL — ABNORMAL HIGH (ref 8–23)
CO2: 23 mmol/L (ref 22–32)
Calcium: 7 mg/dL — ABNORMAL LOW (ref 8.9–10.3)
Chloride: 107 mmol/L (ref 98–111)
Creatinine, Ser: 3.9 mg/dL — ABNORMAL HIGH (ref 0.61–1.24)
GFR calc Af Amer: 17 mL/min — ABNORMAL LOW (ref >60)
GFR calc non Af Amer: 14 mL/min — ABNORMAL LOW (ref >60)
Glucose, Bld: 154 mg/dL — ABNORMAL HIGH (ref 70–99)
Potassium: 3.4 mmol/L — ABNORMAL LOW (ref 3.5–5.1)
Sodium: 140 mmol/L (ref 135–145)
Total Bilirubin: 0.4 mg/dL (ref 0.3–1.2)
Total Protein: 7.7 g/dL (ref 6.5–8.1)

## 2019-12-20 LAB — CBC WITH DIFFERENTIAL/PLATELET
Abs Immature Granulocytes: 0.04 10*3/uL (ref 0.00–0.07)
Basophils Absolute: 0.1 10*3/uL (ref 0.0–0.1)
Basophils Relative: 1 %
Eosinophils Absolute: 0.2 10*3/uL (ref 0.0–0.5)
Eosinophils Relative: 2 %
HCT: 29.9 % — ABNORMAL LOW (ref 39.0–52.0)
Hemoglobin: 9.6 g/dL — ABNORMAL LOW (ref 13.0–17.0)
Immature Granulocytes: 1 %
Lymphocytes Relative: 17 %
Lymphs Abs: 1.5 10*3/uL (ref 0.7–4.0)
MCH: 25.8 pg — ABNORMAL LOW (ref 26.0–34.0)
MCHC: 32.1 g/dL (ref 30.0–36.0)
MCV: 80.4 fL (ref 80.0–100.0)
Monocytes Absolute: 0.7 10*3/uL (ref 0.1–1.0)
Monocytes Relative: 9 %
Neutro Abs: 6 10*3/uL (ref 1.7–7.7)
Neutrophils Relative %: 70 %
Platelets: 277 10*3/uL (ref 150–400)
RBC: 3.72 MIL/uL — ABNORMAL LOW (ref 4.22–5.81)
RDW: 15.9 % — ABNORMAL HIGH (ref 11.5–15.5)
WBC: 8.4 10*3/uL (ref 4.0–10.5)
nRBC: 0 % (ref 0.0–0.2)

## 2019-12-20 LAB — LIPASE, BLOOD: Lipase: 35 U/L (ref 11–51)

## 2019-12-20 LAB — POC SARS CORONAVIRUS 2 AG -  ED: SARS Coronavirus 2 Ag: NEGATIVE

## 2019-12-20 MED ORDER — ONDANSETRON 4 MG PO TBDP: ORAL_TABLET | ORAL | 0 refills | Status: DC

## 2019-12-20 MED ORDER — SODIUM CHLORIDE 0.9 % IV BOLUS
1000.0000 mL | Freq: Once | INTRAVENOUS | Status: AC
  Administered 2019-12-20: 08:00:00 1000 mL via INTRAVENOUS

## 2019-12-20 MED ORDER — ONDANSETRON HCL 4 MG/2ML IJ SOLN
4.0000 mg | Freq: Once | INTRAMUSCULAR | Status: AC
  Administered 2019-12-20: 08:00:00 4 mg via INTRAVENOUS
  Filled 2019-12-20: qty 2

## 2019-12-20 NOTE — ED Provider Notes (Signed)
[facility] EMERGENCY DEPARTMENT Provider Note   CSN: [id number] Arrival date & time: 12/20/19  [id number]     History Chief Complaint  Patient presents with  . Nausea  . Fatigue    Omar Parker is a 74 y.o. male.  Patient complains of a couple days of just feeling fatigued  The history is provided by the patient. No language interpreter was used.  Weakness Severity:  Moderate Onset quality:  Sudden Timing:  Constant Progression:  Waxing and waning Chronicity:  New Context: not alcohol use   Relieved by:  Nothing Worsened by:  Nothing Associated symptoms: no abdominal pain, no chest pain, no cough, no diarrhea, no frequency, no headaches and no seizures        Past Medical History:  Diagnosis Date  . Anemia    IDA  . Diabetes mellitus without complication ([place])   . High cholesterol   . Hypertension     Patient Active Problem List   Diagnosis Date Noted  . Absolute anemia 04/04/2017  . AKI (acute kidney injury) ([place]) 06/17/2016  . DM (diabetes mellitus screen) 06/17/2016  . Dehydration 06/17/2016  . CKD (chronic kidney disease), stage III 06/17/2016    Past Surgical History:  Procedure Laterality Date  . COLONOSCOPY N/A 05/16/2017   Procedure: COLONOSCOPY;  Surgeon: Rogene Houston, MD;  Location: [facility];  Service: Endoscopy;  Laterality: N/A;  7:30  . HERNIA REPAIR         No family history on file.  Social History   Tobacco Use  . Smoking status: Never Smoker  . Smokeless tobacco: Never Used  Substance Use Topics  . Alcohol use: No  . Drug use: No    Home Medications Prior to Admission medications   Medication Sig Start Date End Date Taking? Authorizing Provider  aspirin 81 MG chewable tablet Chew 81 mg by mouth daily.   Yes [provider]  carvedilol (COREG) 6.25 MG tablet Take 6.25 mg by mouth 2 (two) times daily with a meal.   Yes [provider]  diltiazem (TIAZAC) 420 MG 24 hr capsule Take 420 mg by mouth daily.    Yes [provider]  furosemide (LASIX) 20 MG tablet Take 20 mg by mouth daily.   Yes [provider]  glipiZIDE (GLUCOTROL) 10 MG tablet Take 10 mg by mouth 2 (two) times daily before a meal.    Yes [provider]  insulin glargine (LANTUS) 100 UNIT/ML injection Inject 50 Units into the skin at bedtime.   Yes [provider]  insulin regular (NOVOLIN R,HUMULIN R) 100 units/mL injection Inject 8 Units into the skin 2 (two) times daily before a meal.    Yes [provider]  lisinopril (PRINIVIL,ZESTRIL) 40 MG tablet Take 40 mg by mouth daily.   Yes [provider]  pravastatin (PRAVACHOL) 80 MG tablet Take 40 mg by mouth at bedtime.    Yes [provider]  ondansetron (ZOFRAN ODT) 4 MG disintegrating tablet 4mg  ODT q4 hours prn nausea/vomit 12/20/19   Milton Ferguson, MD    Allergies    Patient has no known allergies.  Review of Systems   Review of Systems  Constitutional: Negative for appetite change and fatigue.  HENT: Negative for congestion, ear discharge and sinus pressure.   Eyes: Negative for discharge.  Respiratory: Negative for cough.   Cardiovascular: Negative for chest pain.  Gastrointestinal: Negative for abdominal pain and diarrhea.  Genitourinary: Negative for frequency and hematuria.  Musculoskeletal:  Negative for back pain.  Skin: Negative for rash.  Neurological: Positive for weakness. Negative for seizures and headaches.  Psychiatric/Behavioral: Negative for hallucinations.    Physical Exam Updated Vital Signs BP (!) 200/104   Pulse 84   Temp 98.1 F (36.7 C) (Oral)   Resp 15   Ht 5\' 11"  (1.803 m)   Wt 89.8 kg   SpO2 100%   BMI 27.62 kg/m   Physical Exam Vitals and nursing note reviewed.  Constitutional:      Appearance: He is well-developed.  HENT:     Head: Normocephalic.     Nose: Nose normal.  Eyes:     General: No scleral icterus.    Conjunctiva/sclera: Conjunctivae normal.  Neck:      Thyroid: No thyromegaly.  Cardiovascular:     Rate and Rhythm: Normal rate and regular rhythm.     Heart sounds: No murmur. No friction rub. No gallop.   Pulmonary:     Breath sounds: No stridor. No wheezing or rales.  Chest:     Chest wall: No tenderness.  Abdominal:     General: There is no distension.     Tenderness: There is no abdominal tenderness. There is no rebound.  Musculoskeletal:        General: Normal range of motion.     Cervical back: Neck supple.  Lymphadenopathy:     Cervical: No cervical adenopathy.  Skin:    Findings: No erythema or rash.  Neurological:     Mental Status: He is alert and oriented to person, place, and time.     Motor: No abnormal muscle tone.     Coordination: Coordination normal.  Psychiatric:        Behavior: Behavior normal.     ED Results / Procedures / Treatments   Labs (all labs ordered are listed, but only abnormal results are displayed) Labs Reviewed  CBC WITH DIFFERENTIAL/PLATELET - Abnormal; Notable for the following components:      Result Value   RBC 3.72 (*)    Hemoglobin 9.6 (*)    HCT 29.9 (*)    MCH 25.8 (*)    RDW 15.9 (*)    All other components within normal limits  COMPREHENSIVE METABOLIC PANEL - Abnormal; Notable for the following components:   Potassium 3.4 (*)    Glucose, Bld 154 (*)    BUN 37 (*)    Creatinine, Ser 3.90 (*)    Calcium 7.0 (*)    Albumin 3.2 (*)    GFR calc non Af Amer 14 (*)    GFR calc Af Amer 17 (*)    All other components within normal limits  URINALYSIS, ROUTINE W REFLEX MICROSCOPIC - Abnormal; Notable for the following components:   Color, Urine STRAW (*)    Glucose, UA 50 (*)    Protein, ur >=300 (*)    All other components within normal limits  NOVEL CORONAVIRUS, NAA (HOSP ORDER, SEND-OUT TO REF LAB; TAT 18-24 HRS)  LIPASE, BLOOD  POC SARS CORONAVIRUS 2 AG -  ED    EKG EKG Interpretation  Date/Time:  Saturday December 20 2019 07:10:53 EST Ventricular Rate:  86 PR  Interval:    QRS Duration: 89 QT Interval:  403 QTC Calculation: 482 R Axis:   18 Text Interpretation: Sinus rhythm Probable left atrial enlargement Abnormal T, consider ischemia, lateral leads Confirmed by Milton Ferguson [phone]) on 12/20/2019 8:27:00 AM   Radiology DG Chest Portable 1 View  Result Date: 12/20/2019 CLINICAL DATA:  Weakness EXAM: PORTABLE CHEST 1 VIEW COMPARISON:  11/24/2019 chest radiograph. FINDINGS: Stable cardiomediastinal silhouette with top-normal heart size. No pneumothorax. No pleural effusion. Lungs appear clear, with no acute consolidative airspace disease and no pulmonary edema. IMPRESSION: No active disease. Electronically Signed   By: Ilona Sorrel M.D.   On: 12/20/2019 08:00    Procedures Procedures (including critical care time)  Medications Ordered in ED Medications  sodium chloride 0.9 % bolus 1,000 mL (0 mLs Intravenous Stopped 12/20/19 0856)  ondansetron (ZOFRAN) injection 4 mg (4 mg Intravenous Given 12/20/19 0739)    ED Course  I have reviewed the triage vital signs and the nursing notes.  Pertinent labs & imaging results that were available during my care of the patient were reviewed by me and considered in my medical decision making (see chart for details).    MDM Rules/Calculators/A&P                      Patient has chronic anemia and renal disease which seems to be stable.  Patient had a -15-minute Covid but we have sent out today Covid test.  He will be given Zofran will follow-up with his PCP Final Clinical Impression(s) / ED Diagnoses Final diagnoses:  Nausea    Rx / DC Orders ED Discharge Orders         Ordered    ondansetron (ZOFRAN ODT) 4 MG disintegrating tablet  Status:  Discontinued     12/20/19 1010    ondansetron (ZOFRAN ODT) 4 MG disintegrating tablet     12/20/19 1014           Milton Ferguson, MD 12/20/19 1018

## 2019-12-20 NOTE — ED Notes (Signed)
Notified edp re: pt's bp, reports he told pt to take home bp meds when he gets home.

## 2019-12-20 NOTE — Discharge Instructions (Addendum)
Follow-up with your family doctor this week for recheck.  Make sure you take your medicines today.

## 2019-12-20 NOTE — ED Notes (Signed)
Patient stated he has not taken his blood pressure medication today

## 2019-12-20 NOTE — ED Triage Notes (Signed)
Pt reports nausea and fatigue "just doesn't feel good". Pt says these sx started yesterday evening. Pt denies vomiting, fever, diarrhea, or coughing. Pt ambulatory with steady gait. Pt reports a "tightness in abdomen", but says it doesn't "hurt".

## 2019-12-21 LAB — NOVEL CORONAVIRUS, NAA (HOSP ORDER, SEND-OUT TO REF LAB; TAT 18-24 HRS): SARS-CoV-2, NAA: NOT DETECTED

## 2020-01-03 ENCOUNTER — Encounter (HOSPITAL_COMMUNITY): Payer: Self-pay | Admitting: Pharmacy Technician

## 2020-01-03 ENCOUNTER — Emergency Department (HOSPITAL_COMMUNITY)
Admission: EM | Admit: 2020-01-03 | Discharge: 2020-01-03 | Disposition: A | Discharge status: Home / Self Care | Payer: BC Managed Care – PPO | Attending: Emergency Medicine | Admitting: Emergency Medicine

## 2020-01-03 DIAGNOSIS — R609 Edema, unspecified: Secondary | ICD-10-CM | POA: Insufficient documentation

## 2020-01-03 DIAGNOSIS — E1122 Type 2 diabetes mellitus with diabetic chronic kidney disease: Secondary | ICD-10-CM | POA: Diagnosis not present

## 2020-01-03 DIAGNOSIS — I129 Hypertensive chronic kidney disease with stage 1 through stage 4 chronic kidney disease, or unspecified chronic kidney disease: Secondary | ICD-10-CM | POA: Insufficient documentation

## 2020-01-03 DIAGNOSIS — Z794 Long term (current) use of insulin: Secondary | ICD-10-CM | POA: Insufficient documentation

## 2020-01-03 DIAGNOSIS — I10 Essential (primary) hypertension: Secondary | ICD-10-CM

## 2020-01-03 DIAGNOSIS — N183 Chronic kidney disease, stage 3 unspecified: Secondary | ICD-10-CM | POA: Diagnosis not present

## 2020-01-03 DIAGNOSIS — R5383 Other fatigue: Secondary | ICD-10-CM | POA: Diagnosis not present

## 2020-01-03 LAB — BASIC METABOLIC PANEL
Anion gap: 11 (ref 5–15)
BUN: 40 mg/dL — ABNORMAL HIGH (ref 8–23)
CO2: 23 mmol/L (ref 22–32)
Calcium: 7.7 mg/dL — ABNORMAL LOW (ref 8.9–10.3)
Chloride: 106 mmol/L (ref 98–111)
Creatinine, Ser: 4.01 mg/dL — ABNORMAL HIGH (ref 0.61–1.24)
GFR calc Af Amer: 16 mL/min — ABNORMAL LOW (ref >60)
GFR calc non Af Amer: 14 mL/min — ABNORMAL LOW (ref >60)
Glucose, Bld: 140 mg/dL — ABNORMAL HIGH (ref 70–99)
Potassium: 3.4 mmol/L — ABNORMAL LOW (ref 3.5–5.1)
Sodium: 140 mmol/L (ref 135–145)

## 2020-01-03 LAB — URINALYSIS, ROUTINE W REFLEX MICROSCOPIC
Bacteria, UA: NONE SEEN
Bilirubin Urine: NEGATIVE
Glucose, UA: 50 mg/dL — AB
Hgb urine dipstick: NEGATIVE
Ketones, ur: NEGATIVE mg/dL
Leukocytes,Ua: NEGATIVE
Nitrite: NEGATIVE
Protein, ur: >=300 mg/dL — AB
Specific Gravity, Urine: 1.017 (ref 1.005–1.030)
pH: 6 (ref 5.0–8.0)

## 2020-01-03 LAB — CBC
HCT: 29 % — ABNORMAL LOW (ref 39.0–52.0)
Hemoglobin: 9.3 g/dL — ABNORMAL LOW (ref 13.0–17.0)
MCH: 25.8 pg — ABNORMAL LOW (ref 26.0–34.0)
MCHC: 32.1 g/dL (ref 30.0–36.0)
MCV: 80.6 fL (ref 80.0–100.0)
Platelets: 341 10*3/uL (ref 150–400)
RBC: 3.6 MIL/uL — ABNORMAL LOW (ref 4.22–5.81)
RDW: 15.7 % — ABNORMAL HIGH (ref 11.5–15.5)
WBC: 10.4 10*3/uL (ref 4.0–10.5)
nRBC: 0 % (ref 0.0–0.2)

## 2020-01-03 LAB — CBG MONITORING, ED: Glucose-Capillary: 133 mg/dL — ABNORMAL HIGH (ref 70–99)

## 2020-01-03 MED ORDER — SODIUM CHLORIDE 0.9% FLUSH: 3.0000 mL | Freq: Once | INTRAVENOUS | Status: DC

## 2020-01-03 MED ORDER — HYDRALAZINE HCL 25 MG PO TABS
25.0000 mg | ORAL_TABLET | Freq: Once | ORAL | Status: AC
  Administered 2020-01-03: 25 mg via ORAL
  Filled 2020-01-03: qty 1

## 2020-01-03 MED ORDER — HYDRALAZINE HCL 25 MG PO TABS: 25.0000 mg | ORAL_TABLET | ORAL | 0 refills | Status: DC | PRN

## 2020-01-03 NOTE — ED Notes (Signed)
Pt complains that he has felt fatigued, and his sugars have been dropping to the 50s by the time he wakes up, and his bp has been high despite taking his medications. Denies any CP, SOB, abdominal pain, difficulties with bowel or bladder, diaphoresis, or fever.

## 2020-01-03 NOTE — ED Provider Notes (Addendum)
[facility] EMERGENCY DEPARTMENT Provider Note   CSN: [id number] Arrival date & time: 01/03/20  1623     History Chief Complaint  Patient presents with  . Fatigue  . Hypertension    Omar Parker is a 74 y.o. male with a past medical history of IDDM, CKD, hypertension, iron deficiency anemia presenting to the ED for fatigue and hypertension.  States for the past week he has been checking his blood pressures at home, have been normal and intermittently elevated to 993Z systolic.  He has been taking his home medications including his lisinopril as prescribed.  Has been checking his sugars and have been low in the 50s.  He denies any specific pain in his chest, abdomen, headache, numbness in arms or legs, vomiting or diarrhea.  States that he had similar symptoms when he presented to the ER about 2 weeks ago, negative Covid test and lab work was similar to baseline.  He is scheduled to meet with his PCP in a few weeks.  Denies any injuries or falls, sick contacts with similar symptoms, cough, shortness of breath.  States that his bilateral lower extremity edema is chronic and he does not feel that it is worsened.  HPI     Past Medical History:  Diagnosis Date  . Anemia    IDA  . Diabetes mellitus without complication ([place])   . High cholesterol   . Hypertension     Patient Active Problem List   Diagnosis Date Noted  . Absolute anemia 04/04/2017  . AKI (acute kidney injury) ([place]) 06/17/2016  . DM (diabetes mellitus screen) 06/17/2016  . Dehydration 06/17/2016  . CKD (chronic kidney disease), stage III 06/17/2016    Past Surgical History:  Procedure Laterality Date  . COLONOSCOPY N/A 05/16/2017   Procedure: COLONOSCOPY;  Surgeon: Rogene Houston, MD;  Location: [facility];  Service: Endoscopy;  Laterality: N/A;  7:30  . HERNIA REPAIR         No family history on file.  Social History   Tobacco Use  . Smoking status: Never Smoker  . Smokeless tobacco:  Never Used  Substance Use Topics  . Alcohol use: No  . Drug use: No    Home Medications Prior to Admission medications   Medication Sig Start Date End Date Taking? Authorizing Provider  aspirin 81 MG chewable tablet Chew 81 mg by mouth daily.    [provider]  carvedilol (COREG) 6.25 MG tablet Take 6.25 mg by mouth 2 (two) times daily with a meal.    [provider]  diltiazem (TIAZAC) 420 MG 24 hr capsule Take 420 mg by mouth daily.    [provider]  furosemide (LASIX) 20 MG tablet Take 20 mg by mouth daily.    [provider]  glipiZIDE (GLUCOTROL) 10 MG tablet Take 10 mg by mouth 2 (two) times daily before a meal.     [provider]  hydrALAZINE (APRESOLINE) 25 MG tablet Take 1 tablet (25 mg total) by mouth as needed. 01/03/20   Catalaya Garr, PA-C  insulin glargine (LANTUS) 100 UNIT/ML injection Inject 50 Units into the skin at bedtime.    [provider]  insulin regular (NOVOLIN R,HUMULIN R) 100 units/mL injection Inject 8 Units into the skin 2 (two) times daily before a meal.     [provider]  lisinopril (PRINIVIL,ZESTRIL) 40 MG tablet Take 40 mg by mouth daily.    [provider]  ondansetron (ZOFRAN ODT) 4  MG disintegrating tablet 4mg  ODT q4 hours prn nausea/vomit 12/20/19   Milton Ferguson, MD  pravastatin (PRAVACHOL) 80 MG tablet Take 40 mg by mouth at bedtime.     [provider]    Allergies    Patient has no known allergies.  Review of Systems   Review of Systems  Constitutional: Positive for fatigue. Negative for appetite change, chills and fever.  HENT: Negative for ear pain, rhinorrhea, sneezing and sore throat.   Eyes: Negative for photophobia and visual disturbance.  Respiratory: Negative for cough, chest tightness, shortness of breath and wheezing.   Cardiovascular: Negative for chest pain and palpitations.  Gastrointestinal: Negative for abdominal pain, blood in stool,  constipation, diarrhea, nausea and vomiting.  Genitourinary: Negative for dysuria, hematuria and urgency.  Musculoskeletal: Negative for myalgias.  Skin: Negative for rash.  Neurological: Negative for dizziness, weakness and light-headedness.    Physical Exam Updated Vital Signs BP (!) 194/83   Pulse 64   Temp 98.3 F (36.8 C) (Oral)   Resp 17   SpO2 99%   Physical Exam Vitals and nursing note reviewed.  Constitutional:      General: He is not in acute distress.    Appearance: He is well-developed.  HENT:     Head: Normocephalic and atraumatic.     Nose: Nose normal.  Eyes:     General: No scleral icterus.       Left eye: No discharge.     Conjunctiva/sclera: Conjunctivae normal.  Cardiovascular:     Rate and Rhythm: Normal rate and regular rhythm.     Heart sounds: Normal heart sounds. No murmur. No friction rub. No gallop.   Pulmonary:     Effort: Pulmonary effort is normal. No respiratory distress.     Breath sounds: Normal breath sounds.  Abdominal:     General: Bowel sounds are normal. There is no distension.     Palpations: Abdomen is soft.     Tenderness: There is no abdominal tenderness. There is no guarding.  Musculoskeletal:        General: Normal range of motion.     Cervical back: Normal range of motion and neck supple.     Right lower leg: Edema present.     Left lower leg: Edema present.  Skin:    General: Skin is warm and dry.     Findings: No rash.  Neurological:     General: No focal deficit present.     Mental Status: He is alert and oriented to person, place, and time.     Cranial Nerves: No cranial nerve deficit.     Motor: No weakness or abnormal muscle tone.     Coordination: Coordination normal.     ED Results / Procedures / Treatments   Labs (all labs ordered are listed, but only abnormal results are displayed) Labs Reviewed  BASIC METABOLIC PANEL - Abnormal; Notable for the following components:      Result Value   Potassium 3.4  (*)    Glucose, Bld 140 (*)    BUN 40 (*)    Creatinine, Ser 4.01 (*)    Calcium 7.7 (*)    GFR calc non Af Amer 14 (*)    GFR calc Af Amer 16 (*)    All other components within normal limits  CBC - Abnormal; Notable for the following components:   RBC 3.60 (*)    Hemoglobin 9.3 (*)    HCT 29.0 (*)    MCH 25.8 (*)  RDW 15.7 (*)    All other components within normal limits  URINALYSIS, ROUTINE W REFLEX MICROSCOPIC - Abnormal; Notable for the following components:   Glucose, UA 50 (*)    Protein, ur >=300 (*)    All other components within normal limits  CBG MONITORING, ED - Abnormal; Notable for the following components:   Glucose-Capillary 133 (*)    All other components within normal limits    EKG EKG Interpretation  Date/Time:  Saturday January 03 2020 16:32:42 EST Ventricular Rate:  64 PR Interval:  240 QRS Duration: 96 QT Interval:  438 QTC Calculation: 451 R Axis:   13 Text Interpretation: Sinus rhythm with 1st degree A-V block Left ventricular hypertrophy with repolarization abnormality ( Cornell product ) Cannot rule out Septal infarct , age undetermined Abnormal ECG TWI in the lateral leads - more pronounced Confirmed by Varney Biles [phone]) on 01/03/2020 8:19:20 PM   Radiology No results found.  Procedures Procedures (including critical care time)  Medications Ordered in ED Medications  sodium chloride flush (NS) 0.9 % injection 3 mL (has no administration in time range)  hydrALAZINE (APRESOLINE) tablet 25 mg (has no administration in time range)    ED Course  I have reviewed the triage vital signs and the nursing notes.  Pertinent labs & imaging results that were available during my care of the patient were reviewed by me and considered in my medical decision making (see chart for details).    MDM Rules/Calculators/A&P                      Patient's blood pressure initially elevated in the emergency department today. Patient denies headache, change  in vision, numbness, weakness, chest pain, dyspnea, dizziness, or lightheadedness therefore doubt hypertensive emergency. Discussed elevated blood pressure with the patient and the need for primary care follow up with potential need to initiate or change antihypertensive medications and or for further evaluation.  Lab work here showing CKD at his baseline which could be contributing to his hypertension.  His blood sugars have normal here.  He has no deficits from his neurological exam.  Lower extremity edema appears chronic.  He is not hypoxic, tachycardic. EKG without ischemic changes. He declines further work-up and would like to follow-up with his PCP.  Will give as needed hydralazine and 1 dose here.  Discussed return precaution signs/symptoms for hypertensive emergency as listed above with the patient.   Patient is hemodynamically stable, in NAD, and able to ambulate in the ED. Evaluation does not show pathology that would require ongoing emergent intervention or inpatient treatment. I have personally reviewed and interpreted all lab work and imaging at today's ED visit. I explained the diagnosis to the patient. Pain has been managed and has no complaints prior to discharge. Patient is comfortable with above plan and is stable for discharge at this time. All questions were answered prior to disposition. Strict return precautions for returning to the ED were discussed. Encouraged follow up with PCP.   An After Visit Summary was printed and given to the patient.   Portions of this note were generated with [employer]. Dictation errors may occur despite best attempts at proofreading.  Final Clinical Impression(s) / ED Diagnoses Final diagnoses:  Essential hypertension    Rx / DC Orders ED Discharge Orders         Ordered    hydrALAZINE (APRESOLINE) 25 MG tablet  As needed     01/03/20 2254  Delia Heady, PA-C 01/03/20 [place], Ankit, MD 01/04/20  [phone]

## 2020-01-03 NOTE — ED Triage Notes (Signed)
Pt presents pov with reports of fatigue, hypertension, and hypoglycemia. States his BP has been running high for a few weeks and his cbg in the mornings is in the 50. Reports has had less energy over the last few weeks. Denies NVD or fevers.

## 2020-01-03 NOTE — ED Notes (Signed)
Discharge instructions reviewed, pt educated on how to take PRN hydralazine for hypertension greater than 180. Pt a/o and ambulatory upon discharge.

## 2020-01-03 NOTE — Discharge Instructions (Addendum)
Continue your home medications as previously prescribed. It is important for you to take your blood pressure medications as prescribed.  If you need to be on a different medication use we will need to speak to your primary care provider. Return to the ED if you start to experience chest pain, shortness of breath, numbness in arms or legs, injuries or falls.

## 2020-03-02 ENCOUNTER — Emergency Department (HOSPITAL_COMMUNITY): Payer: BC Managed Care – PPO

## 2020-03-02 ENCOUNTER — Encounter (HOSPITAL_COMMUNITY): Payer: Self-pay | Admitting: Emergency Medicine

## 2020-03-02 ENCOUNTER — Other Ambulatory Visit: Payer: Self-pay

## 2020-03-02 ENCOUNTER — Observation Stay (HOSPITAL_COMMUNITY)
Admission: EM | Admit: 2020-03-02 | Discharge: 2020-03-03 | Disposition: A | Discharge status: Home / Self Care | Payer: BC Managed Care – PPO | Attending: Internal Medicine | Admitting: Internal Medicine

## 2020-03-02 DIAGNOSIS — E1122 Type 2 diabetes mellitus with diabetic chronic kidney disease: Secondary | ICD-10-CM | POA: Insufficient documentation

## 2020-03-02 DIAGNOSIS — N184 Chronic kidney disease, stage 4 (severe): Secondary | ICD-10-CM | POA: Diagnosis not present

## 2020-03-02 DIAGNOSIS — I169 Hypertensive crisis, unspecified: Secondary | ICD-10-CM | POA: Diagnosis present

## 2020-03-02 DIAGNOSIS — Z7982 Long term (current) use of aspirin: Secondary | ICD-10-CM | POA: Insufficient documentation

## 2020-03-02 DIAGNOSIS — E785 Hyperlipidemia, unspecified: Secondary | ICD-10-CM | POA: Insufficient documentation

## 2020-03-02 DIAGNOSIS — Z79899 Other long term (current) drug therapy: Secondary | ICD-10-CM | POA: Insufficient documentation

## 2020-03-02 DIAGNOSIS — T783XXA Angioneurotic edema, initial encounter: Secondary | ICD-10-CM | POA: Diagnosis present

## 2020-03-02 DIAGNOSIS — I129 Hypertensive chronic kidney disease with stage 1 through stage 4 chronic kidney disease, or unspecified chronic kidney disease: Principal | ICD-10-CM | POA: Insufficient documentation

## 2020-03-02 DIAGNOSIS — Z794 Long term (current) use of insulin: Secondary | ICD-10-CM | POA: Insufficient documentation

## 2020-03-02 DIAGNOSIS — E7800 Pure hypercholesterolemia, unspecified: Secondary | ICD-10-CM | POA: Diagnosis not present

## 2020-03-02 DIAGNOSIS — E876 Hypokalemia: Secondary | ICD-10-CM | POA: Diagnosis not present

## 2020-03-02 DIAGNOSIS — I10 Essential (primary) hypertension: Secondary | ICD-10-CM | POA: Diagnosis not present

## 2020-03-02 DIAGNOSIS — Z20822 Contact with and (suspected) exposure to covid-19: Secondary | ICD-10-CM | POA: Insufficient documentation

## 2020-03-02 DIAGNOSIS — T464X5A Adverse effect of angiotensin-converting-enzyme inhibitors, initial encounter: Secondary | ICD-10-CM | POA: Insufficient documentation

## 2020-03-02 DIAGNOSIS — N179 Acute kidney failure, unspecified: Secondary | ICD-10-CM | POA: Insufficient documentation

## 2020-03-02 DIAGNOSIS — D631 Anemia in chronic kidney disease: Secondary | ICD-10-CM | POA: Diagnosis not present

## 2020-03-02 DIAGNOSIS — E119 Type 2 diabetes mellitus without complications: Secondary | ICD-10-CM

## 2020-03-02 HISTORY — DX: Chronic kidney disease, stage 4 (severe): N18.4

## 2020-03-02 LAB — CBC
HCT: 32 % — ABNORMAL LOW (ref 39.0–52.0)
Hemoglobin: 9.9 g/dL — ABNORMAL LOW (ref 13.0–17.0)
MCH: 25.3 pg — ABNORMAL LOW (ref 26.0–34.0)
MCHC: 30.9 g/dL (ref 30.0–36.0)
MCV: 81.8 fL (ref 80.0–100.0)
Platelets: 322 10*3/uL (ref 150–400)
RBC: 3.91 MIL/uL — ABNORMAL LOW (ref 4.22–5.81)
RDW: 15.8 % — ABNORMAL HIGH (ref 11.5–15.5)
WBC: 7.9 10*3/uL (ref 4.0–10.5)
nRBC: 0 % (ref 0.0–0.2)

## 2020-03-02 LAB — COMPREHENSIVE METABOLIC PANEL WITH GFR
ALT: 11 U/L (ref 0–44)
AST: 17 U/L (ref 15–41)
Albumin: 3.2 g/dL — ABNORMAL LOW (ref 3.5–5.0)
Alkaline Phosphatase: 45 U/L (ref 38–126)
Anion gap: 13 (ref 5–15)
BUN: 45 mg/dL — ABNORMAL HIGH (ref 8–23)
CO2: 19 mmol/L — ABNORMAL LOW (ref 22–32)
Calcium: 6.7 mg/dL — ABNORMAL LOW (ref 8.9–10.3)
Chloride: 109 mmol/L (ref 98–111)
Creatinine, Ser: 4.79 mg/dL — ABNORMAL HIGH (ref 0.61–1.24)
GFR calc Af Amer: 13 mL/min — ABNORMAL LOW
GFR calc non Af Amer: 11 mL/min — ABNORMAL LOW
Glucose, Bld: 147 mg/dL — ABNORMAL HIGH (ref 70–99)
Potassium: 3.2 mmol/L — ABNORMAL LOW (ref 3.5–5.1)
Sodium: 141 mmol/L (ref 135–145)
Total Bilirubin: 0.4 mg/dL (ref 0.3–1.2)
Total Protein: 7.8 g/dL (ref 6.5–8.1)

## 2020-03-02 LAB — HEMOGLOBIN A1C
Hgb A1c MFr Bld: 6.9 % — ABNORMAL HIGH (ref 4.8–5.6)
Mean Plasma Glucose: 151.33 mg/dL

## 2020-03-02 LAB — GLUCOSE, CAPILLARY
Glucose-Capillary: 125 mg/dL — ABNORMAL HIGH (ref 70–99)
Glucose-Capillary: 123 mg/dL — ABNORMAL HIGH (ref 70–99)

## 2020-03-02 LAB — RESPIRATORY PANEL BY RT PCR (FLU A&B, COVID)
Influenza A by PCR: NEGATIVE
Influenza B by PCR: NEGATIVE
SARS Coronavirus 2 by RT PCR: NEGATIVE

## 2020-03-02 MED ORDER — ACETAMINOPHEN 650 MG RE SUPP: 650.0000 mg | Freq: Four times a day (QID) | RECTAL | Status: DC | PRN

## 2020-03-02 MED ORDER — ONDANSETRON HCL 4 MG PO TABS: 4.0000 mg | ORAL_TABLET | Freq: Four times a day (QID) | ORAL | Status: DC | PRN

## 2020-03-02 MED ORDER — ACETAMINOPHEN 325 MG PO TABS
650.0000 mg | ORAL_TABLET | Freq: Four times a day (QID) | ORAL | Status: DC | PRN
  Administered 2020-03-03: 650 mg via ORAL
  Filled 2020-03-02: qty 2

## 2020-03-02 MED ORDER — DILTIAZEM HCL ER BEADS 300 MG PO CP24: 420.0000 mg | ORAL_CAPSULE | Freq: Every day | ORAL | Status: DC

## 2020-03-02 MED ORDER — SODIUM CHLORIDE 0.9 % IV SOLN
1.0000 g | Freq: Once | INTRAVENOUS | Status: AC
  Administered 2020-03-02: 1 g via INTRAVENOUS
  Filled 2020-03-02: qty 10

## 2020-03-02 MED ORDER — CALCIUM CARBONATE ANTACID 1250 MG/5ML PO SUSP
500.0000 mg | Freq: Four times a day (QID) | ORAL | Status: DC | PRN
  Filled 2020-03-02: qty 5

## 2020-03-02 MED ORDER — POTASSIUM CHLORIDE 10 MEQ/100ML IV SOLN
10.0000 meq | Freq: Once | INTRAVENOUS | Status: AC
  Administered 2020-03-02: 12:00:00 10 meq via INTRAVENOUS
  Filled 2020-03-02: qty 100

## 2020-03-02 MED ORDER — ONDANSETRON HCL 4 MG/2ML IJ SOLN: 4.0000 mg | Freq: Four times a day (QID) | INTRAMUSCULAR | Status: DC | PRN

## 2020-03-02 MED ORDER — CAMPHOR-MENTHOL 0.5-0.5 % EX LOTN
1.0000 "application " | TOPICAL_LOTION | Freq: Three times a day (TID) | CUTANEOUS | Status: DC | PRN
  Filled 2020-03-02: qty 222

## 2020-03-02 MED ORDER — HYDRALAZINE HCL 25 MG PO TABS
25.0000 mg | ORAL_TABLET | Freq: Three times a day (TID) | ORAL | Status: DC
  Administered 2020-03-02 – 2020-03-03 (×4): 25 mg via ORAL
  Filled 2020-03-02 (×4): qty 1

## 2020-03-02 MED ORDER — HYDROXYZINE HCL 25 MG PO TABS: 25.0000 mg | ORAL_TABLET | Freq: Three times a day (TID) | ORAL | Status: DC | PRN

## 2020-03-02 MED ORDER — DILTIAZEM HCL ER COATED BEADS 180 MG PO CP24
420.0000 mg | ORAL_CAPSULE | Freq: Every day | ORAL | Status: DC
  Administered 2020-03-02 – 2020-03-03 (×2): 420 mg via ORAL
  Filled 2020-03-02: qty 2
  Filled 2020-03-02: qty 1

## 2020-03-02 MED ORDER — HYDRALAZINE HCL 20 MG/ML IJ SOLN
5.0000 mg | Freq: Once | INTRAMUSCULAR | Status: AC
  Administered 2020-03-02: 5 mg via INTRAVENOUS
  Filled 2020-03-02: qty 1

## 2020-03-02 MED ORDER — PRAVASTATIN SODIUM 40 MG PO TABS
40.0000 mg | ORAL_TABLET | Freq: Every day | ORAL | Status: DC
  Administered 2020-03-02: 40 mg via ORAL
  Filled 2020-03-02: qty 1

## 2020-03-02 MED ORDER — ASPIRIN 81 MG PO CHEW
81.0000 mg | CHEWABLE_TABLET | Freq: Every day | ORAL | Status: DC
  Administered 2020-03-02 – 2020-03-03 (×2): 81 mg via ORAL
  Filled 2020-03-02 (×2): qty 1

## 2020-03-02 MED ORDER — DOCUSATE SODIUM 283 MG RE ENEM
1.0000 | ENEMA | RECTAL | Status: DC | PRN
  Filled 2020-03-02: qty 1

## 2020-03-02 MED ORDER — CARVEDILOL 6.25 MG PO TABS
6.2500 mg | ORAL_TABLET | Freq: Two times a day (BID) | ORAL | Status: DC
  Administered 2020-03-02 – 2020-03-03 (×3): 6.25 mg via ORAL
  Filled 2020-03-02 (×3): qty 1

## 2020-03-02 MED ORDER — INSULIN ASPART 100 UNIT/ML ~~LOC~~ SOLN
0.0000 [IU] | Freq: Three times a day (TID) | SUBCUTANEOUS | Status: DC
  Administered 2020-03-02: 1 [IU] via SUBCUTANEOUS
  Administered 2020-03-03: 2 [IU] via SUBCUTANEOUS

## 2020-03-02 MED ORDER — NEPRO/CARBSTEADY PO LIQD
237.0000 mL | Freq: Three times a day (TID) | ORAL | Status: DC | PRN
  Filled 2020-03-02: qty 237

## 2020-03-02 MED ORDER — SORBITOL 70 % SOLN
30.0000 mL | Status: DC | PRN
  Filled 2020-03-02: qty 30

## 2020-03-02 MED ORDER — HEPARIN SODIUM (PORCINE) 5000 UNIT/ML IJ SOLN
5000.0000 [IU] | Freq: Three times a day (TID) | INTRAMUSCULAR | Status: DC
  Administered 2020-03-02 (×2): 5000 [IU] via SUBCUTANEOUS
  Filled 2020-03-02 (×3): qty 1

## 2020-03-02 MED ORDER — HYDRALAZINE HCL 20 MG/ML IJ SOLN
5.0000 mg | INTRAMUSCULAR | Status: DC | PRN
  Administered 2020-03-02: 10 mg via INTRAVENOUS
  Filled 2020-03-02: qty 1

## 2020-03-02 MED ORDER — FUROSEMIDE 20 MG PO TABS
20.0000 mg | ORAL_TABLET | Freq: Every day | ORAL | Status: DC
  Administered 2020-03-02 – 2020-03-03 (×2): 20 mg via ORAL
  Filled 2020-03-02 (×2): qty 1

## 2020-03-02 MED ORDER — ZOLPIDEM TARTRATE 5 MG PO TABS: 5.0000 mg | ORAL_TABLET | Freq: Every evening | ORAL | Status: DC | PRN

## 2020-03-02 NOTE — Progress Notes (Signed)
Inpatient Diabetes Program Recommendations  AACE/ADA: New Consensus Statement on Inpatient Glycemic Control (2015)  Target Ranges:  Prepandial:   less than 140 mg/dL      Peak postprandial:   less than 180 mg/dL (1-2 hours)      Critically ill patients:  140 - 180 mg/dL   Results for Omar Parker, Omar Parker (MRN [id number]) as of 03/02/2020 13:46  Ref. Range 03/02/2020 08:24  Glucose Latest Ref Range: 70 - 99 mg/dL 147 (H)    Admit with: Fatigue/ Uncontrolled HTN/ Stage 4-5 CKD (has not seen PCP in over 1 year due to the pandemic)  History: DM, CKD4  Home DM Meds: Lantus 50 units QHS       Regular Insulin 8 units TID (only using once per day due to work schedule)       Glipizide 10 mg BID  Current Orders: Novolog Sensitive Correction Scale/ SSI (0-9 units) TID AC    PCP: Dr. Gerarda Fraction in [place]  Current Hemoglobin A1c level pending.    MD- Note Creatinine higher this admission.  Told the Admitting MD that he is having issues with Hypoglycemia at home.  This is not surprising given his creatinine is worse.  Note that only Novolog Correction scale (SSI) is ordered right now--Agree as CBG only 147 on admission.  May need the addition of some basal insulin back at some point.  If CBG elevated in the AM (05/05) then recommend starting back a portion of his home dose of Lantus.  At time of discharge, may need reduction of Insulin doses and may be best to stop the Glipizide.     --Will follow patient during hospitalization--  Wyn Quaker RN, MSN, CDE Diabetes Coordinator Inpatient Glycemic Control Team Team Pager: [phone] (8a-5p)

## 2020-03-02 NOTE — Progress Notes (Signed)
Patient arrived to room in NAD. Patient oriented to room, wife aware of patient being admitted.

## 2020-03-02 NOTE — H&P (Signed)
History and Physical    Omar Parker WRU:[id number] DOB: [birth year] DOA: 03/02/2020  PCP: [employer] [employer], MD Consultants:  Laural Golden - GI; does not have a nephrologist Patient coming from:  Home - lives with wife; NOK: Wife, [phone]  Chief Complaint: Fatigue  HPI: Omar Parker is a 74 y.o. male with medical history significant of HTN; HLD: and DM presenting with fatigue.  He reports that he has been feeling weak, with nausea.  The symptoms have been present for "a while" - he has been to the ER for this several times.  It comes and goes.  He is not sure what makes it worse.  His blood pressure is high and his glucose is low sometimes.  His BP is not always high.  No vomiting.  No Cp.  He also noticed swelling in his upper lip last night.  His last dose of Lisinopril was yesterday AM.  No SOB.  No dysphagia.  He reports that he does not have a nephrologist and no one has ever discussed possible dialysis with him.  He has not received routine primary care in the last year due to the [place] pandemic.  He was previously seen on 3 prior occasions since January of this year for similar concerns.    ED Course:  Soft admission - BP high, creatinine worse, angioedema of lips maybe contributing to abdominal pain and fatigue.  Likely needs more BP adjustment.  Needs to be off Lisinopril.  F/u at [facility] in 1 month.  Dr. Arty Baumgartner recommends outpatient evaluation.  Review of Systems: As per HPI; otherwise review of systems reviewed and negative.   Ambulatory Status:  Ambulates without assistance  COVID Vaccine Status:   Complete  Past Medical History:  Diagnosis Date  . Anemia    IDA  . Diabetes mellitus without complication ([place])   . High cholesterol   . Hypertension   . Stage 4 chronic kidney disease [facility])     Past Surgical History:  Procedure Laterality Date  . COLONOSCOPY N/A 05/16/2017   Procedure: COLONOSCOPY;  Surgeon: Rogene Houston, MD;  Location[facility];  Service: Endoscopy;   Laterality: N/A;  7:30  . HERNIA REPAIR      Social History   Socioeconomic History  . Marital status: Married    Spouse name: Not on file  . Number of children: Not on file  . Years of education: Not on file  . Highest education level: Not on file  Occupational History  . Occupation: distribution work  Tobacco Use  . Smoking status: Never Smoker  . Smokeless tobacco: Never Used  Substance and Sexual Activity  . Alcohol use: Not Currently    Comment: no h/o heavy use  . Drug use: Not Currently    Types: Marijuana    Comment: none in 20 years  . Sexual activity: Not on file  Other Topics Concern  . Not on file  Social History Narrative  . Not on file   Social Determinants of Health   Financial Resource Strain:   . Difficulty of Paying Living Expenses:   Food Insecurity:   . Worried About Charity fundraiser in the Last Year:   . Arboriculturist in the Last Year:   Transportation Needs:   . Film/video editor (Medical):   Marland Kitchen Lack of Transportation (Non-Medical):   Physical Activity:   . Days of Exercise per Week:   . Minutes of Exercise per Session:   Stress:   .  Feeling of Stress :   Social Connections:   . Frequency of Communication with Friends and Family:   . Frequency of Social Gatherings with Friends and Family:   . Attends Religious Services:   . Active Member of Clubs or Organizations:   . Attends Archivist Meetings:   Marland Kitchen Marital Status:   Intimate Partner Violence:   . Fear of Current or Ex-Partner:   . Emotionally Abused:   Marland Kitchen Physically Abused:   . Sexually Abused:     Allergies  Allergen Reactions  . Ace Inhibitors Swelling    Family History  Problem Relation Age of Onset  . Chronic Renal Failure Neg Hx     Prior to Admission medications   Medication Sig Start Date End Date Taking? Authorizing Provider  aspirin 81 MG chewable tablet Chew 81 mg by mouth daily.   Yes [provider]  carvedilol (COREG) 6.25 MG tablet  Take 6.25 mg by mouth 2 (two) times daily with a meal.   Yes [provider]  diltiazem (TIAZAC) 420 MG 24 hr capsule Take 420 mg by mouth daily.   Yes [provider]  furosemide (LASIX) 20 MG tablet Take 20 mg by mouth daily.   Yes [provider]  glipiZIDE (GLUCOTROL) 10 MG tablet Take 10 mg by mouth 2 (two) times daily before a meal.    Yes [provider]  hydrALAZINE (APRESOLINE) 25 MG tablet Take 1 tablet (25 mg total) by mouth as needed. Patient taking differently: Take 25 mg by mouth as needed (blood pressure.).  01/03/20  Yes Khatri, Hina, PA-C  insulin glargine (LANTUS) 100 UNIT/ML injection Inject 50 Units into the skin at bedtime.   Yes [provider]  insulin regular (NOVOLIN R,HUMULIN R) 100 units/mL injection Inject 8 Units into the skin 3 (three) times daily before meals.    Yes [provider]  lisinopril (PRINIVIL,ZESTRIL) 40 MG tablet Take 40 mg by mouth daily.   Yes [provider]  pravastatin (PRAVACHOL) 80 MG tablet Take 40 mg by mouth at bedtime.    Yes [provider]  ondansetron (ZOFRAN ODT) 4 MG disintegrating tablet 4mg  ODT q4 hours prn nausea/vomit Patient not taking: Reported on 03/02/2020 12/20/19   Milton Ferguson, MD    Physical Exam: Vitals:   03/02/20 1000 03/02/20 1015 03/02/20 1030 03/02/20 1039  BP: (!) 183/80 (!) 183/85 (!) 189/83 (!) 189/83  Pulse: 60 60 63 65  Resp: 15 15 11 16   Temp:      TempSrc:      SpO2: 100% 100% 100% 100%  Weight:      Height:         . General:  Appears calm and comfortable and is NAD . Eyes:  PERRL, EOMI, normal lids, iris . ENT:  grossly normal hearing, tongue, mmm; mild upper lip edema without apparent tongue or airway involvement - wife reports that this is already improving     . Neck:  no LAD, masses or thyromegaly . Cardiovascular:  RRR, no m/r/g. No LE edema.  Marland Kitchen Respiratory:   CTA bilaterally with no wheezes/rales/rhonchi.  Normal  respiratory effort. . Abdomen:  soft, NT, ND, NABS . Back:   normal alignment, no CVAT . Skin:  no rash or induration seen on limited exam . Musculoskeletal:  grossly normal tone BUE/BLE, good ROM, no bony abnormality . Psychiatric:  blunted mood and affect, speech fluent and appropriate, AOx3 . Neurologic:  CN 2-12 grossly intact, moves all  extremities in coordinated fashion    Radiological Exams on Admission: DG Chest Portable 1 View  Result Date: 03/02/2020 CLINICAL DATA:  Weakness EXAM: PORTABLE CHEST 1 VIEW COMPARISON:  12/20/2019 FINDINGS: Normal heart size and mediastinal contours. No acute infiltrate or edema. Increased density at the right apex attributed to the first costochondral junction, stable since at least 2009. No effusion or pneumothorax. No acute osseous findings. IMPRESSION: No active disease. Electronically Signed   By: Monte Fantasia M.D.   On: 03/02/2020 11:12    EKG: Independently reviewed.  NSR with rate 63; nonspecific ST changes with no evidence of acute ischemia; NSCSLT   Labs on Admission: I have personally reviewed the available labs and imaging studies at the time of the admission.  Pertinent labs:   K+ 3.2 CO2 19 Glucose 147 BUN 45/Creatinine 4.79/GFR 13; baseline creatinine appears to be about 4 Calcium 6.7 Albumin 3.2 WBC 7.9 Hgb 9.9 - stable   Assessment/Plan Principal Problem:   Uncontrolled hypertension Active Problems:   Stage 4 chronic kidney disease (HCC)   High cholesterol   Diabetes mellitus without complication (HCC)   Angioedema of lips   Uncontrolled HTN -Patient with chronic HTN -Has not received routine health care in a year due to COVID pandemic -Poor baseline BP control -He is having periodic fatigue and nausea which is very likely related to progressive CKD due to uncontrolled HTN -Desperately needs better BP control -Presenting with mild (incidental) angioedema so will need to no longer take ACE/ARB (although this  should already have been stopped due to progressive CKD) -Will continue home meds - Coreg, Tiazac (very high dose, may need to be lowered) -Will add standing 25 mg hydralazine q8h and can titrate if needed -Current BP goal is 160-180 but needs <140-150 long-term to prevent further renal progression (if possible) -I offered outpatient management with close outpatient f/u but the patient prefers overnight observation -Will observe on telemetry, anticipate d/c tomorrow (5/5) -Suggest outpatient PCP on Friday, 5/7 - TOC team consult requested  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 Arty Baumgartner who recommends outpatient nephrology f/u -[facility] team consult requested to help arrange  DM -Will check A1c -hold Glucotrol -For now, will also hold Lantus - he reports nocturnal hypoglycemia -Diabetes coordinator consult requested -Cover with sensitive-scale SSI  Angioedema -Appears to have mild ACE-induced angioedema -Last dose of ACE was yesterday AM -Improving since onset last night -No further treatment appears to be indicated -ACE has been added to his allergy list  HLD -Continue Pravachol    Note: This patient has been tested and is pending for the novel coronavirus COVID-19.  DVT prophylaxis: Heparin Code Status:  Full - confirmed with patient/family Family Communication: Wife was present throughout evaluation. Disposition Plan:  The patient is from: home  Anticipated d/c is to: home without [facility] services   Anticipated d/c date will depend on clinical response to treatment, but possibly as early as tomorrow if he has excellent response to treatment  Patient is currently: chronically ill Consults called: Nephrology by telephone only; TOC team/diabetes  coordinator Admission status:  It is my clinical opinion that referral for OBSERVATION is reasonable and necessary in this patient based on the above information provided. The aforementioned taken together are felt to place the patient at  high risk for further clinical deterioration. However it is anticipated that the patient may be medically stable for discharge from the hospital within 24 to 48 hours.    Karmen Bongo MD [facility]   How to contact the [facility] Attending or Consulting provider [place] or covering provider during after hours [place], for this patient?  1. Check the care team in [facility] and look for a) attending/consulting TR[facility] provider listed and b) the [facility] team listed 2. Log into [email or web address] and use 's universal password to access. If you do not have the password, please contact the hospital operator. 3. Locate the [facility] provider you are looking for under [facility] and page to a number that you can be directly reached. 4. If you still have difficulty reaching the provider, please page the [facility] (Director on Call) for the Hospitalists listed on amion for assistance.   03/02/2020, 12:22 PM

## 2020-03-02 NOTE — ED Triage Notes (Addendum)
Pt reports swelling to upper lip that began today,  generalized weakness, sweating and bitter taste in his mouth since yesterday. Pt states "I just feel sick." endorses ace inhibitor use for HTN. Denies difficulty breathing or swallowing, speaking in full sentences, resp e/u, nad.

## 2020-03-02 NOTE — Progress Notes (Signed)
Called patients pharmacy listed, he has only had two prescriptions filled there, one was hydralazine, just 5 pills ion march 8th. The other was January. In speaking to patient he gets most of his medication from the [place]. Patient does have a primary care MD Dr Gerarda Fraction in [place]. Their number is [phone]. I  have called and made an appointment for post hospital  But their systems are currently down and unable to schedule at this time.

## 2020-03-02 NOTE — ED Provider Notes (Signed)
[facility] EMERGENCY DEPARTMENT Provider Note   CSN: [id number] Arrival date & time: 03/02/20  1638     History Chief Complaint  Patient presents with  . Angioedema  . Weakness    Omar Parker is a 74 y.o. male.  HPI Patient presents with generalized weakness.  States began yesterday.  States he woke up with yesterday.  States he just feels little bad.  States his upper lip is also swollen.  No chest pain.  No trouble breathing.  No fevers.  No nausea or vomiting.  No sick contacts.  He had his second Covid shot around a month ago.  No difficulty breathing.  No nausea or vomiting.  He is on lisinopril.    Past Medical History:  Diagnosis Date  . Anemia    IDA  . Diabetes mellitus without complication ([place])   . High cholesterol   . Hypertension     Patient Active Problem List   Diagnosis Date Noted  . Absolute anemia 04/04/2017  . AKI (acute kidney injury) ([place]) 06/17/2016  . DM (diabetes mellitus screen) 06/17/2016  . Dehydration 06/17/2016  . CKD (chronic kidney disease), stage III 06/17/2016    Past Surgical History:  Procedure Laterality Date  . COLONOSCOPY N/A 05/16/2017   Procedure: COLONOSCOPY;  Surgeon: Rogene Houston, MD;  Location[facility];  Service: Endoscopy;  Laterality: N/A;  7:30  . HERNIA REPAIR         No family history on file.  Social History   Tobacco Use  . Smoking status: Never Smoker  . Smokeless tobacco: Never Used  Substance Use Topics  . Alcohol use: No  . Drug use: No    Home Medications Prior to Admission medications   Medication Sig Start Date End Date Taking? Authorizing Provider  aspirin 81 MG chewable tablet Chew 81 mg by mouth daily.   Yes [provider]  carvedilol (COREG) 6.25 MG tablet Take 6.25 mg by mouth 2 (two) times daily with a meal.   Yes [provider]  diltiazem (TIAZAC) 420 MG 24 hr capsule Take 420 mg by mouth daily.   Yes [provider]  furosemide  (LASIX) 20 MG tablet Take 20 mg by mouth daily.   Yes [provider]  glipiZIDE (GLUCOTROL) 10 MG tablet Take 10 mg by mouth 2 (two) times daily before a meal.    Yes [provider]  hydrALAZINE (APRESOLINE) 25 MG tablet Take 1 tablet (25 mg total) by mouth as needed. Patient taking differently: Take 25 mg by mouth as needed (blood pressure.).  01/03/20  Yes Khatri, Hina, PA-C  insulin glargine (LANTUS) 100 UNIT/ML injection Inject 50 Units into the skin at bedtime.   Yes [provider]  insulin regular (NOVOLIN R,HUMULIN R) 100 units/mL injection Inject 8 Units into the skin 3 (three) times daily before meals.    Yes [provider]  lisinopril (PRINIVIL,ZESTRIL) 40 MG tablet Take 40 mg by mouth daily.   Yes [provider]  pravastatin (PRAVACHOL) 80 MG tablet Take 40 mg by mouth at bedtime.    Yes [provider]  ondansetron (ZOFRAN ODT) 4 MG disintegrating tablet 4mg  ODT q4 hours prn nausea/vomit Patient not taking: Reported on 03/02/2020 12/20/19   Milton Ferguson, MD    Allergies    Patient has no known allergies.  Review of Systems   Review of Systems  Constitutional: Negative for appetite change.  HENT: Positive for facial swelling. Negative for  congestion.   Respiratory: Negative for shortness of breath.   Cardiovascular: Negative for chest pain.  Gastrointestinal: Negative for abdominal pain.  Genitourinary: Negative for flank pain.  Musculoskeletal: Negative for back pain.  Skin: Negative for rash.  Neurological: Positive for weakness.  Psychiatric/Behavioral: Negative for confusion.    Physical Exam Updated Vital Signs BP (!) 189/83 (BP Location: Right Arm)   Pulse 65   Temp 98.3 F (36.8 C) (Oral)   Resp 16   Ht 5\' 11"  (1.803 m)   Wt 89.4 kg   SpO2 100%   BMI 27.48 kg/m   Physical Exam Vitals and nursing note reviewed.  HENT:     Head: Atraumatic.     Mouth/Throat:     Comments: Angioedema of upper lip.   No posterior pharyngeal involvement.  No tongue involvement. Eyes:     Extraocular Movements: Extraocular movements intact.  Cardiovascular:     Rate and Rhythm: Regular rhythm.  Pulmonary:     Breath sounds: No wheezing or rhonchi.  Abdominal:     Tenderness: There is no abdominal tenderness.  Musculoskeletal:     Cervical back: Neck supple.     Right lower leg: No edema.     Left lower leg: No edema.  Skin:    General: Skin is warm.     Capillary Refill: Capillary refill takes less than 2 seconds.  Neurological:     Mental Status: He is alert and oriented to person, place, and time.     ED Results / Procedures / Treatments   Labs (all labs ordered are listed, but only abnormal results are displayed) Labs Reviewed  COMPREHENSIVE METABOLIC PANEL - Abnormal; Notable for the following components:      Result Value   Potassium 3.2 (*)    CO2 19 (*)    Glucose, Bld 147 (*)    BUN 45 (*)    Creatinine, Ser 4.79 (*)    Calcium 6.7 (*)    Albumin 3.2 (*)    GFR calc non Af Amer 11 (*)    GFR calc Af Amer 13 (*)    All other components within normal limits  CBC - Abnormal; Notable for the following components:   RBC 3.91 (*)    Hemoglobin 9.9 (*)    HCT 32.0 (*)    MCH 25.3 (*)    RDW 15.8 (*)    All other components within normal limits    EKG EKG Interpretation  Date/Time:  Tuesday Mar 02 2020 08:17:10 EDT Ventricular Rate:  63 PR Interval:    QRS Duration: 101 QT Interval:  474 QTC Calculation: 486 R Axis:   33 Text Interpretation: Sinus rhythm Prolonged PR interval Probable left atrial enlargement Lateral T wave inversion No significant change since last tracing Confirmed by Davonna Belling [phone]) on 03/02/2020 10:14:15 AM   Radiology No results found.  Procedures Procedures (including critical care time)  Medications Ordered in ED Medications  calcium gluconate 1 g in sodium chloride 0.9 % 100 mL IVPB (has no administration in time range)  potassium  chloride 10 mEq in 100 mL IVPB (has no administration in time range)    ED Course  I have reviewed the triage vital signs and the nursing notes.  Pertinent labs & imaging results that were available during my care of the patient were reviewed by me and considered in my medical decision making (see chart for details).    MDM Rules/Calculators/A&P  Patient presented feeling bad and swelling of his upper lip.  Felt bad for last few days.  Has new onset angioedema of lips.  Is on lisinopril.  Moderate hypertension with pressures between 761 and 518 systolic.  Does have history of hypertension.  Has chronic renal sufficiency with baseline creatinine appears to be around 4.  However creatinine up to 4.8 today.  Also hypokalemia and hypocalcemia.  Has an appointment with his nephrologist in about a month.  Discussed with Dr. Marval Regal from nephrology.  Recommend supplementing potassium and calcium.  Does not necessarily need admission from nephrology standpoint.  However patient has been persistently hypertensive.  With worsening kidney function and the fact that with the angioedema of the lips will need to stop his lisinopril.  I feels the patient would benefit from [place] the hospital for blood pressure control, electrolyte adjustment and medication adjustment.  Will discuss with hospitalist. Final Clinical Impression(s) / ED Diagnoses Final diagnoses:  Angioedema of lips, initial encounter  AKI (acute kidney injury) ([place])  Hypokalemia  Hypocalcemia  Hypertension, unspecified type    Rx / DC Orders ED Discharge Orders    None       Davonna Belling, MD 03/02/20 1044

## 2020-03-03 DIAGNOSIS — I10 Essential (primary) hypertension: Secondary | ICD-10-CM | POA: Diagnosis not present

## 2020-03-03 LAB — BASIC METABOLIC PANEL
Anion gap: 13 (ref 5–15)
Anion gap: 13 (ref 5–15)
BUN: 43 mg/dL — ABNORMAL HIGH (ref 8–23)
BUN: 45 mg/dL — ABNORMAL HIGH (ref 8–23)
CO2: 20 mmol/L — ABNORMAL LOW (ref 22–32)
CO2: 19 mmol/L — ABNORMAL LOW (ref 22–32)
Calcium: 6.6 mg/dL — ABNORMAL LOW (ref 8.9–10.3)
Calcium: 6.6 mg/dL — ABNORMAL LOW (ref 8.9–10.3)
Chloride: 104 mmol/L (ref 98–111)
Chloride: 106 mmol/L (ref 98–111)
Creatinine, Ser: 4.55 mg/dL — ABNORMAL HIGH (ref 0.61–1.24)
Creatinine, Ser: 4.43 mg/dL — ABNORMAL HIGH (ref 0.61–1.24)
GFR calc Af Amer: 14 mL/min — ABNORMAL LOW (ref >60)
GFR calc Af Amer: 14 mL/min — ABNORMAL LOW (ref >60)
GFR calc non Af Amer: 12 mL/min — ABNORMAL LOW (ref >60)
GFR calc non Af Amer: 12 mL/min — ABNORMAL LOW (ref >60)
Glucose, Bld: 200 mg/dL — ABNORMAL HIGH (ref 70–99)
Glucose, Bld: 155 mg/dL — ABNORMAL HIGH (ref 70–99)
Potassium: 2.9 mmol/L — ABNORMAL LOW (ref 3.5–5.1)
Potassium: 3.4 mmol/L — ABNORMAL LOW (ref 3.5–5.1)
Sodium: 137 mmol/L (ref 135–145)
Sodium: 138 mmol/L (ref 135–145)

## 2020-03-03 LAB — CBC
HCT: 27.6 % — ABNORMAL LOW (ref 39.0–52.0)
Hemoglobin: 8.9 g/dL — ABNORMAL LOW (ref 13.0–17.0)
MCH: 25.3 pg — ABNORMAL LOW (ref 26.0–34.0)
MCHC: 32.2 g/dL (ref 30.0–36.0)
MCV: 78.4 fL — ABNORMAL LOW (ref 80.0–100.0)
Platelets: 296 10*3/uL (ref 150–400)
RBC: 3.52 MIL/uL — ABNORMAL LOW (ref 4.22–5.81)
RDW: 15.7 % — ABNORMAL HIGH (ref 11.5–15.5)
WBC: 9.3 10*3/uL (ref 4.0–10.5)
nRBC: 0 % (ref 0.0–0.2)

## 2020-03-03 LAB — GLUCOSE, CAPILLARY
Glucose-Capillary: 106 mg/dL — ABNORMAL HIGH (ref 70–99)
Glucose-Capillary: 155 mg/dL — ABNORMAL HIGH (ref 70–99)
Glucose-Capillary: 150 mg/dL — ABNORMAL HIGH (ref 70–99)

## 2020-03-03 LAB — MAGNESIUM: Magnesium: 1.2 mg/dL — ABNORMAL LOW (ref 1.7–2.4)

## 2020-03-03 MED ORDER — POTASSIUM CHLORIDE CRYS ER 20 MEQ PO TBCR
40.0000 meq | EXTENDED_RELEASE_TABLET | Freq: Once | ORAL | Status: AC
  Administered 2020-03-03: 40 meq via ORAL
  Filled 2020-03-03: qty 2

## 2020-03-03 MED ORDER — POTASSIUM CHLORIDE CRYS ER 20 MEQ PO TBCR
20.0000 meq | EXTENDED_RELEASE_TABLET | Freq: Once | ORAL | Status: AC
  Administered 2020-03-03: 20 meq via ORAL
  Filled 2020-03-03: qty 1

## 2020-03-03 MED ORDER — HYDRALAZINE HCL 25 MG PO TABS: 25.0000 mg | ORAL_TABLET | Freq: Three times a day (TID) | ORAL | 0 refills | Status: DC

## 2020-03-03 MED ORDER — MAGNESIUM SULFATE 2 GM/50ML IV SOLN
2.0000 g | Freq: Once | INTRAVENOUS | Status: DC
  Filled 2020-03-03: qty 50

## 2020-03-03 MED ORDER — MAGNESIUM OXIDE 400 (241.3 MG) MG PO TABS
400.0000 mg | ORAL_TABLET | Freq: Every day | ORAL | Status: DC
  Administered 2020-03-03: 400 mg via ORAL
  Filled 2020-03-03: qty 1

## 2020-03-03 MED ORDER — POTASSIUM CHLORIDE CRYS ER 10 MEQ PO TBCR: 10.0000 meq | EXTENDED_RELEASE_TABLET | Freq: Every day | ORAL | 0 refills | Status: DC

## 2020-03-03 MED ORDER — MAGNESIUM OXIDE 400 MG PO CAPS: 1.0000 | ORAL_CAPSULE | Freq: Every day | ORAL | 0 refills | Status: DC

## 2020-03-03 NOTE — Social Work (Addendum)
Appointment made for pt to see PCP Dr. Gerarda Fraction for hospital f/u on Monday 5/10 at 1:30pm. This has been added to AVS.  Westley Hummer, MSW, [place] [employer]

## 2020-03-03 NOTE — Discharge Instructions (Signed)
  [facility] [address], [place],  [place]  [address]  Main: [phone]  Fax: [phone]    Angioedema  Angioedema is sudden swelling in the body. The swelling can happen in any part of the body. It often happens on the skin and causes itchy, bumpy patches (hives) to form. This condition may:  Happen only one time.  Happen more than one time. It may come back at random times.  Keep coming back for a number of years. Someday it may stop coming back. Follow these instructions at home:  Take over-the-counter and prescription medicines only as told by your doctor.  If you were given medicines for emergency allergy treatment, always carry them with you.  Wear a medical bracelet as told by your doctor.  Avoid the things that cause your attacks (triggers).  If this condition was passed to you from your parents and you want to have kids, talk to your doctor. Your kids may also have this condition. Contact a doctor if:  You have another attack.  Your attacks happen more often, even after you take steps to prevent them.  This condition was passed to you by your parents and you want to have kids. Get help right away if:  Your mouth, tongue, or lips get very swollen.  You have trouble breathing.  You have trouble swallowing.  You pass out (faint). This information is not intended to replace advice given to you by your health care provider. Make sure you discuss any questions you have with your health care provider. Document Revised: 09/28/2017 Document Reviewed: 04/25/2016 Elsevier Patient Education  2020 [employer].

## 2020-03-03 NOTE — Discharge Summary (Signed)
Physician Discharge Summary  MOURAD CWIKLA ZJI:[id number] DOB: 1946-06-04 DOA: 03/02/2020  PCP: Redmond School, MD  Admit date: 03/02/2020 Discharge date: 03/03/2020  Admitted From: home Discharge disposition: home   Recommendations for Outpatient Follow-Up:   1. Has an appointment with primary care provider Monday, May 10 for evaluation of blood pressure control, resolution of angioedema and progression of chronic kidney disease and diabetes control as meds adjusted due to worsening kidney function.  Recommend basic metabolic panel to track electrolytes and kidney function- may need to adjust medications if hypo/hyperkalemia 2. referral to nephrology   Discharge Diagnosis:   Principal Problem:   Uncontrolled hypertension Active Problems:   Stage 4 chronic kidney disease (HCC)   High cholesterol   Diabetes mellitus without complication (HCC)   Angioedema of lips    Discharge Condition: Improved.  Diet recommendation: Low sodium, heart healthy.  Carbohydrate-modified.  Wound care: None.  Code status: Full.   History of Present Illness:   Omar Parker is a 74 y.o. male with medical history significant of HTN; HLD: and DM presented 03/02/20 with fatigue.  He reported that he had been feeling weak, with nausea.  The symptoms  present for "a while" - he had been to the ER for this several times.  It comes and goes.  He was not sure what made it worse.  His blood pressure was elevated and his glucose  low sometimes.  He reports his BP is not always high.  No vomiting.  No Cp.  He also noticed swelling in his upper lip last night.  His last dose of Lisinopril was yesterday AM.  No SOB.  No dysphagia.  He reported that he does not have a nephrologist and no one has ever discussed possible dialysis with him.  He had not received routine primary care in the last year due to the [place] pandemic.  He was previously seen on 3 prior occasions since January of this year for similar  concerns.  Hospital Course by Problem:    Uncontrolled HTN -Patient with chronic HTN -Has not received routine health care in a year due to COVID pandemic -Poor baseline BP control -He was having periodic fatigue and nausea which likely related to progressive CKD due to uncontrolled HTN -Presented with mild (incidental) angioedema ACE/ARB discontinued. Other home meds Coreg, Tiazac continued and  25 mg hydralazine q8h added as well as lasix 20mg  daily. BP improved at discharge but not at goal. Will need close OP follow up. Has apt with PCP  5/10.   Stage 4-5 CKD -Progressive CKD, appears to be approaching ESRD. Likely cause of  intermittent fatigue and nausea.Patient was d/w Dr. Arty Baumgartner who recommended outpatient nephrology f/u.   DM - A1c 6.9. -Glucotrol held and will be stopped at discharge. Also held  Lantus but will resume at discharge.   Angioedema -Appeared to have mild ACE-induced angioedema on admission. Resolved at discharge -Last dose of ACE was 5/3 AM -No further treatment indicated -ACE has been added to his allergy list  HLD -Continue Pravachol  Hypokalemia./hyoimagnesemia -repleated   Medical Consultants:   admitter Discussed with nephrology who recommended OP referral to nephrology   Discharge Exam:   Vitals:   03/03/20 0951 03/03/20 1213  BP: (!) 176/81 (!) 177/90  Pulse: 66 64  Resp:  18  Temp:    SpO2:  97%   Vitals:   03/03/20 0053 03/03/20 0624 03/03/20 0951 03/03/20 1213  BP: (!) 162/78 (!) 168/85 Marland Kitchen)  176/81 (!) 177/90  Pulse: 65 63 66 64  Resp: 18 16  18   Temp: 98.9 F (37.2 C) 98.6 F (37 C)    TempSrc: Oral Oral    SpO2: 99% 98%  97%  Weight:      Height:        General exam: Appears calm and comfortable. No acute distress Respiratory system: Clear to auscultation. Respiratory effort normal. Cardiovascular system: S1 & S2 heard, RRR. No JVD,  rubs, gallops or clicks. No murmurs. Gastrointestinal system: Abdomen is  nondistended, soft and nontender. No organomegaly or masses felt. Normal bowel sounds heard. Central nervous system: Alert and oriented. No focal neurological deficits. Extremities: No clubbing,  or cyanosis. No edema. Skin: No rashes, lesions or ulcers. Psychiatry: Judgement and insight appear normal. Mood & affect appropriate.    The results of significant diagnostics from this hospitalization (including imaging, microbiology, ancillary and laboratory) are listed below for reference.     Procedures and Diagnostic Studies:   DG Chest Portable 1 View  Result Date: 03/02/2020 CLINICAL DATA:  Weakness EXAM: PORTABLE CHEST 1 VIEW COMPARISON:  12/20/2019 FINDINGS: Normal heart size and mediastinal contours. No acute infiltrate or edema. Increased density at the right apex attributed to the first costochondral junction, stable since at least 2009. No effusion or pneumothorax. No acute osseous findings. IMPRESSION: No active disease. Electronically Signed   By: Monte Fantasia M.D.   On: 03/02/2020 11:12     Labs:   Basic Metabolic Panel: Recent Labs  Lab 03/02/20 0824 03/02/20 0824 03/03/20 0741 03/03/20 1448  NA 141  --  137 138  K 3.2*   < > 2.9* 3.4*  CL 109  --  104 106  CO2 19*  --  20* 19*  GLUCOSE 147*  --  200* 155*  BUN 45*  --  43* 45*  CREATININE 4.79*  --  4.55* 4.43*  CALCIUM 6.7*  --  6.6* 6.6*  MG  --   --  1.2*  --    < > = values in this interval not displayed.   GFR Estimated Creatinine Clearance: 16.1 mL/min (A) (by C-G formula based on SCr of 4.43 mg/dL (H)). Liver Function Tests: Recent Labs  Lab 03/02/20 0824  AST 17  ALT 11  ALKPHOS 45  BILITOT 0.4  PROT 7.8  ALBUMIN 3.2*   No results for input(s): LIPASE, AMYLASE in the last 168 hours. No results for input(s): AMMONIA in the last 168 hours. Coagulation profile No results for input(s): INR, PROTIME in the last 168 hours.  CBC: Recent Labs  Lab 03/02/20 0824 03/03/20 0741  WBC 7.9 9.3  HGB  9.9* 8.9*  HCT 32.0* 27.6*  MCV 81.8 78.4*  PLT 322 296   Cardiac Enzymes: No results for input(s): CKTOTAL, CKMB, CKMBINDEX, TROPONINI in the last 168 hours. BNP: Invalid input(s): POCBNP CBG: Recent Labs  Lab 03/02/20 1623 03/02/20 2148 03/03/20 0622 03/03/20 1212 03/03/20 1552  GLUCAP 125* 123* 106* 155* 150*   D-Dimer No results for input(s): DDIMER in the last 72 hours. Hgb A1c Recent Labs    03/02/20 0824  HGBA1C 6.9*   Lipid Profile No results for input(s): CHOL, HDL, LDLCALC, TRIG, CHOLHDL, LDLDIRECT in the last 72 hours. Thyroid function studies No results for input(s): TSH, T4TOTAL, T3FREE, THYROIDAB in the last 72 hours.  Invalid input(s): FREET3 Anemia work up No results for input(s): VITAMINB12, FOLATE, FERRITIN, TIBC, IRON, RETICCTPCT in the last 72 hours. Microbiology Recent Results (from the  past 240 hour(s))  Respiratory Panel by RT PCR (Flu A&B, Covid) - Nasopharyngeal Swab     Status: None   Collection Time: 03/02/20 12:23 PM   Specimen: Nasopharyngeal Swab  Result Value Ref Range Status   SARS Coronavirus 2 by RT PCR NEGATIVE NEGATIVE Final    Comment: (NOTE) SARS-CoV-2 target nucleic acids are NOT DETECTED. The SARS-CoV-2 RNA is generally detectable in upper respiratoy specimens during the acute phase of infection. The lowest concentration of SARS-CoV-2 viral copies this assay can detect is 131 copies/mL. A negative result does not preclude SARS-Cov-2 infection and should not be used as the sole basis for treatment or other patient management decisions. A negative result may occur with  improper specimen collection/handling, submission of specimen other than nasopharyngeal swab, presence of viral mutation(s) within the areas targeted by this assay, and inadequate number of viral copies (<131 copies/mL). A negative result must be combined with clinical observations, patient history, and epidemiological information. The expected result is  Negative. Fact Sheet for Patients:  PinkCheek.be Fact Sheet for Healthcare Providers:  GravelBags.it This test is not yet ap proved or cleared by the [place] FDA and  has been authorized for detection and/or diagnosis of SARS-CoV-2 by FDA under an Emergency Use Authorization (EUA). This EUA will remain  in effect (meaning this test can be used) for the duration of the COVID-19 declaration under Section 564(b)(1) of the Act, 21 U.S.C. section 360bbb-3(b)(1), unless the authorization is terminated or revoked sooner.    Influenza A by PCR NEGATIVE NEGATIVE Final   Influenza B by PCR NEGATIVE NEGATIVE Final    Comment: (NOTE) The Xpert Xpress SARS-CoV-2/FLU/RSV assay is intended as an aid in  the diagnosis of influenza from Nasopharyngeal swab specimens and  should not be used as a sole basis for treatment. Nasal washings and  aspirates are unacceptable for Xpert Xpress SARS-CoV-2/FLU/RSV  testing. Fact Sheet for Patients: PinkCheek.be Fact Sheet for Healthcare Providers: GravelBags.it This test is not yet approved or cleared by the [place] FDA and  has been authorized for detection and/or diagnosis of SARS-CoV-2 by  FDA under an Emergency Use Authorization (EUA). This EUA will remain  in effect (meaning this test can be used) for the duration of the  Covid-19 declaration under Section 564(b)(1) of the Act, 21  U.S.C. section 360bbb-3(b)(1), unless the authorization is  terminated or revoked. Performed at [facility], [place] [address], [place], [place] [address]      Discharge Instructions:   Discharge Instructions    Diet - low sodium heart healthy   Complete by: As directed    Diet Carb Modified   Complete by: As directed    Discharge instructions   Complete by: As directed    You need close follow up with your PCP for lab data, specialist  referral and BP check   Increase activity slowly   Complete by: As directed      Allergies as of 03/03/2020      Reactions   Ace Inhibitors Swelling      Medication List    STOP taking these medications   glipiZIDE 10 MG tablet Commonly known as: GLUCOTROL     TAKE these medications   aspirin 81 MG chewable tablet Chew 81 mg by mouth daily.   carvedilol 6.25 MG tablet Commonly known as: COREG Take 6.25 mg by mouth 2 (two) times daily with a meal.   diltiazem 420 MG 24 hr capsule Commonly known as: TIAZAC  Take 420 mg by mouth daily.   furosemide 20 MG tablet Commonly known as: LASIX Take 20 mg by mouth daily.   hydrALAZINE 25 MG tablet Commonly known as: APRESOLINE Take 1 tablet (25 mg total) by mouth every 8 (eight) hours. What changed:   when to take this  reasons to take this   insulin glargine 100 UNIT/ML injection Commonly known as: LANTUS Inject 50 Units into the skin at bedtime.   insulin regular 100 units/mL injection Commonly known as: NOVOLIN R Inject 8 Units into the skin 3 (three) times daily before meals.   Magnesium Oxide 400 MG Caps Take 1 capsule (400 mg total) by mouth daily.   potassium chloride 10 MEQ tablet Commonly known as: Klor-Con M10 Take 1 tablet (10 mEq total) by mouth daily.   pravastatin 80 MG tablet Commonly known as: PRAVACHOL Take 40 mg by mouth at bedtime.      Follow-up Information    Redmond School, MD. Schedule an appointment as soon as possible for a visit on 03/08/2020.   Specialty: Internal Medicine Why: Appointment at 1:30pm, please arrive at 1:00pm, call if you need to reschedule.  Contact information: [address] [place] [address] [phone]            Time coordinating discharge: 40 minutes  Signed:  Geradine Girt DO  [facility] 03/03/2020, 3:56 PM

## 2020-04-09 ENCOUNTER — Encounter (HOSPITAL_COMMUNITY): Payer: Self-pay | Admitting: Emergency Medicine

## 2020-04-09 ENCOUNTER — Other Ambulatory Visit: Payer: Self-pay

## 2020-04-09 ENCOUNTER — Inpatient Hospital Stay (HOSPITAL_COMMUNITY)
Admission: AD | Admit: 2020-04-09 | Discharge: 2020-04-10 | DRG: 377 | Disposition: A | Discharge status: Home / Self Care | Payer: BC Managed Care – PPO | Attending: Family Medicine | Admitting: Family Medicine

## 2020-04-09 DIAGNOSIS — E1129 Type 2 diabetes mellitus with other diabetic kidney complication: Secondary | ICD-10-CM | POA: Diagnosis present

## 2020-04-09 DIAGNOSIS — K319 Disease of stomach and duodenum, unspecified: Secondary | ICD-10-CM | POA: Diagnosis present

## 2020-04-09 DIAGNOSIS — E7800 Pure hypercholesterolemia, unspecified: Secondary | ICD-10-CM | POA: Diagnosis present

## 2020-04-09 DIAGNOSIS — Z8711 Personal history of peptic ulcer disease: Secondary | ICD-10-CM | POA: Diagnosis not present

## 2020-04-09 DIAGNOSIS — Z20822 Contact with and (suspected) exposure to covid-19: Secondary | ICD-10-CM | POA: Diagnosis present

## 2020-04-09 DIAGNOSIS — N186 End stage renal disease: Secondary | ICD-10-CM | POA: Diagnosis present

## 2020-04-09 DIAGNOSIS — I120 Hypertensive chronic kidney disease with stage 5 chronic kidney disease or end stage renal disease: Secondary | ICD-10-CM | POA: Diagnosis present

## 2020-04-09 DIAGNOSIS — D631 Anemia in chronic kidney disease: Secondary | ICD-10-CM | POA: Diagnosis present

## 2020-04-09 DIAGNOSIS — K2970 Gastritis, unspecified, without bleeding: Secondary | ICD-10-CM | POA: Diagnosis present

## 2020-04-09 DIAGNOSIS — K5731 Diverticulosis of large intestine without perforation or abscess with bleeding: Secondary | ICD-10-CM | POA: Diagnosis present

## 2020-04-09 DIAGNOSIS — Z7982 Long term (current) use of aspirin: Secondary | ICD-10-CM | POA: Diagnosis not present

## 2020-04-09 DIAGNOSIS — E785 Hyperlipidemia, unspecified: Secondary | ICD-10-CM | POA: Diagnosis present

## 2020-04-09 DIAGNOSIS — D62 Acute posthemorrhagic anemia: Secondary | ICD-10-CM | POA: Diagnosis not present

## 2020-04-09 DIAGNOSIS — E1122 Type 2 diabetes mellitus with diabetic chronic kidney disease: Secondary | ICD-10-CM | POA: Diagnosis not present

## 2020-04-09 DIAGNOSIS — K5790 Diverticulosis of intestine, part unspecified, without perforation or abscess without bleeding: Secondary | ICD-10-CM | POA: Diagnosis not present

## 2020-04-09 DIAGNOSIS — N185 Chronic kidney disease, stage 5: Secondary | ICD-10-CM | POA: Diagnosis not present

## 2020-04-09 DIAGNOSIS — K635 Polyp of colon: Secondary | ICD-10-CM | POA: Diagnosis present

## 2020-04-09 DIAGNOSIS — E860 Dehydration: Secondary | ICD-10-CM | POA: Diagnosis not present

## 2020-04-09 DIAGNOSIS — K922 Gastrointestinal hemorrhage, unspecified: Secondary | ICD-10-CM | POA: Diagnosis present

## 2020-04-09 DIAGNOSIS — E11649 Type 2 diabetes mellitus with hypoglycemia without coma: Secondary | ICD-10-CM | POA: Diagnosis not present

## 2020-04-09 DIAGNOSIS — Z79899 Other long term (current) drug therapy: Secondary | ICD-10-CM

## 2020-04-09 DIAGNOSIS — I169 Hypertensive crisis, unspecified: Secondary | ICD-10-CM | POA: Diagnosis present

## 2020-04-09 DIAGNOSIS — Z888 Allergy status to other drugs, medicaments and biological substances status: Secondary | ICD-10-CM

## 2020-04-09 DIAGNOSIS — Z794 Long term (current) use of insulin: Secondary | ICD-10-CM | POA: Diagnosis not present

## 2020-04-09 DIAGNOSIS — K449 Diaphragmatic hernia without obstruction or gangrene: Secondary | ICD-10-CM | POA: Diagnosis present

## 2020-04-09 DIAGNOSIS — K921 Melena: Secondary | ICD-10-CM | POA: Diagnosis not present

## 2020-04-09 DIAGNOSIS — Z87891 Personal history of nicotine dependence: Secondary | ICD-10-CM

## 2020-04-09 DIAGNOSIS — I10 Essential (primary) hypertension: Secondary | ICD-10-CM | POA: Diagnosis not present

## 2020-04-09 DIAGNOSIS — D509 Iron deficiency anemia, unspecified: Secondary | ICD-10-CM | POA: Diagnosis present

## 2020-04-09 DIAGNOSIS — K625 Hemorrhage of anus and rectum: Secondary | ICD-10-CM

## 2020-04-09 DIAGNOSIS — K644 Residual hemorrhoidal skin tags: Secondary | ICD-10-CM | POA: Diagnosis present

## 2020-04-09 DIAGNOSIS — R001 Bradycardia, unspecified: Secondary | ICD-10-CM | POA: Diagnosis not present

## 2020-04-09 DIAGNOSIS — N189 Chronic kidney disease, unspecified: Secondary | ICD-10-CM | POA: Diagnosis present

## 2020-04-09 LAB — CBC WITH DIFFERENTIAL/PLATELET
Abs Immature Granulocytes: 0.02 10*3/uL (ref 0.00–0.07)
Basophils Absolute: 0 10*3/uL (ref 0.0–0.1)
Basophils Relative: 1 %
Eosinophils Absolute: 0.2 10*3/uL (ref 0.0–0.5)
Eosinophils Relative: 3 %
HCT: 24.9 % — ABNORMAL LOW (ref 39.0–52.0)
Hemoglobin: 8 g/dL — ABNORMAL LOW (ref 13.0–17.0)
Immature Granulocytes: 0 %
Lymphocytes Relative: 17 %
Lymphs Abs: 1.1 10*3/uL (ref 0.7–4.0)
MCH: 25.5 pg — ABNORMAL LOW (ref 26.0–34.0)
MCHC: 32.1 g/dL (ref 30.0–36.0)
MCV: 79.3 fL — ABNORMAL LOW (ref 80.0–100.0)
Monocytes Absolute: 0.8 10*3/uL (ref 0.1–1.0)
Monocytes Relative: 12 %
Neutro Abs: 4.6 10*3/uL (ref 1.7–7.7)
Neutrophils Relative %: 67 %
Platelets: 288 10*3/uL (ref 150–400)
RBC: 3.14 MIL/uL — ABNORMAL LOW (ref 4.22–5.81)
RDW: 15.4 % (ref 11.5–15.5)
WBC: 6.7 10*3/uL (ref 4.0–10.5)
nRBC: 0 % (ref 0.0–0.2)

## 2020-04-09 LAB — GLUCOSE, CAPILLARY: Glucose-Capillary: 106 mg/dL — ABNORMAL HIGH (ref 70–99)

## 2020-04-09 LAB — HEMOGLOBIN AND HEMATOCRIT, BLOOD
HCT: 31.1 % — ABNORMAL LOW (ref 39.0–52.0)
Hemoglobin: 10.2 g/dL — ABNORMAL LOW (ref 13.0–17.0)

## 2020-04-09 LAB — COMPREHENSIVE METABOLIC PANEL
ALT: 12 U/L (ref 0–44)
AST: 11 U/L — ABNORMAL LOW (ref 15–41)
Albumin: 3.2 g/dL — ABNORMAL LOW (ref 3.5–5.0)
Alkaline Phosphatase: 46 U/L (ref 38–126)
Anion gap: 12 (ref 5–15)
BUN: 87 mg/dL — ABNORMAL HIGH (ref 8–23)
CO2: 21 mmol/L — ABNORMAL LOW (ref 22–32)
Calcium: 8.2 mg/dL — ABNORMAL LOW (ref 8.9–10.3)
Chloride: 104 mmol/L (ref 98–111)
Creatinine, Ser: 6.07 mg/dL — ABNORMAL HIGH (ref 0.61–1.24)
GFR calc Af Amer: 10 mL/min — ABNORMAL LOW (ref >60)
GFR calc non Af Amer: 8 mL/min — ABNORMAL LOW (ref >60)
Glucose, Bld: 102 mg/dL — ABNORMAL HIGH (ref 70–99)
Potassium: 3.7 mmol/L (ref 3.5–5.1)
Sodium: 137 mmol/L (ref 135–145)
Total Bilirubin: 0.3 mg/dL (ref 0.3–1.2)
Total Protein: 7.6 g/dL (ref 6.5–8.1)

## 2020-04-09 LAB — RETICULOCYTES
Immature Retic Fract: 5.8 % (ref 2.3–15.9)
RBC.: 3.95 MIL/uL — ABNORMAL LOW (ref 4.22–5.81)
Retic Count, Absolute: 30.8 10*3/uL (ref 19.0–186.0)
Retic Ct Pct: 0.8 % (ref 0.4–3.1)

## 2020-04-09 LAB — IRON AND TIBC
Iron: 64 ug/dL (ref 45–182)
Saturation Ratios: 24 % (ref 17.9–39.5)
TIBC: 265 ug/dL (ref 250–450)
UIBC: 201 ug/dL

## 2020-04-09 LAB — TSH: TSH: 2.661 u[IU]/mL (ref 0.350–4.500)

## 2020-04-09 LAB — FERRITIN: Ferritin: 88 ng/mL (ref 24–336)

## 2020-04-09 LAB — PROTIME-INR
INR: 1.1 (ref 0.8–1.2)
Prothrombin Time: 13.5 seconds (ref 11.4–15.2)

## 2020-04-09 LAB — FOLATE: Folate: 14.6 ng/mL (ref >5.9)

## 2020-04-09 LAB — POC OCCULT BLOOD, ED: Fecal Occult Bld: POSITIVE — AB

## 2020-04-09 LAB — VITAMIN B12: Vitamin B-12: 548 pg/mL (ref 180–914)

## 2020-04-09 LAB — PREPARE RBC (CROSSMATCH)

## 2020-04-09 LAB — SARS CORONAVIRUS 2 BY RT PCR (HOSPITAL ORDER, PERFORMED IN ~~LOC~~ HOSPITAL LAB): SARS Coronavirus 2: NEGATIVE

## 2020-04-09 LAB — CBG MONITORING, ED: Glucose-Capillary: 98 mg/dL (ref 70–99)

## 2020-04-09 MED ORDER — FENTANYL CITRATE (PF) 100 MCG/2ML IJ SOLN: 12.5000 ug | INTRAMUSCULAR | Status: DC | PRN

## 2020-04-09 MED ORDER — PANTOPRAZOLE SODIUM 40 MG PO TBEC
40.0000 mg | DELAYED_RELEASE_TABLET | Freq: Every day | ORAL | Status: DC
  Administered 2020-04-10: 40 mg via ORAL
  Filled 2020-04-09: qty 1

## 2020-04-09 MED ORDER — TRAZODONE HCL 50 MG PO TABS: 25.0000 mg | ORAL_TABLET | Freq: Every evening | ORAL | Status: DC | PRN

## 2020-04-09 MED ORDER — PRAVASTATIN SODIUM 40 MG PO TABS
40.0000 mg | ORAL_TABLET | Freq: Every day | ORAL | Status: DC
  Administered 2020-04-09: 40 mg via ORAL
  Filled 2020-04-09 (×4): qty 1

## 2020-04-09 MED ORDER — INSULIN GLARGINE 100 UNIT/ML ~~LOC~~ SOLN
25.0000 [IU] | Freq: Every day | SUBCUTANEOUS | Status: DC
  Administered 2020-04-09: 25 [IU] via SUBCUTANEOUS
  Filled 2020-04-09 (×3): qty 0.25

## 2020-04-09 MED ORDER — INSULIN ASPART 100 UNIT/ML ~~LOC~~ SOLN
0.0000 [IU] | Freq: Three times a day (TID) | SUBCUTANEOUS | Status: DC
  Administered 2020-04-10: 1 [IU] via SUBCUTANEOUS

## 2020-04-09 MED ORDER — DILTIAZEM HCL ER BEADS 240 MG PO CP24: 360.0000 mg | ORAL_CAPSULE | Freq: Every day | ORAL | Status: DC

## 2020-04-09 MED ORDER — PEG 3350-KCL-NA BICARB-NACL 420 G PO SOLR
4000.0000 mL | Freq: Once | ORAL | Status: AC
  Administered 2020-04-09: 4000 mL via ORAL

## 2020-04-09 MED ORDER — HYDRALAZINE HCL 25 MG PO TABS
25.0000 mg | ORAL_TABLET | Freq: Three times a day (TID) | ORAL | Status: DC
  Administered 2020-04-09 – 2020-04-10 (×3): 25 mg via ORAL
  Filled 2020-04-09 (×4): qty 1

## 2020-04-09 MED ORDER — INSULIN ASPART 100 UNIT/ML ~~LOC~~ SOLN: 6.0000 [IU] | Freq: Three times a day (TID) | SUBCUTANEOUS | Status: DC

## 2020-04-09 MED ORDER — DILTIAZEM HCL ER BEADS 300 MG PO CP24
420.0000 mg | ORAL_CAPSULE | Freq: Every day | ORAL | Status: DC
  Filled 2020-04-09 (×4): qty 1

## 2020-04-09 MED ORDER — CARVEDILOL 3.125 MG PO TABS
6.2500 mg | ORAL_TABLET | Freq: Two times a day (BID) | ORAL | Status: DC
  Administered 2020-04-09 – 2020-04-10 (×2): 6.25 mg via ORAL
  Filled 2020-04-09 (×3): qty 2

## 2020-04-09 MED ORDER — SODIUM CHLORIDE 0.9% IV SOLUTION: Freq: Once | INTRAVENOUS | Status: AC

## 2020-04-09 MED ORDER — ONDANSETRON HCL 4 MG PO TABS: 4.0000 mg | ORAL_TABLET | Freq: Four times a day (QID) | ORAL | Status: DC | PRN

## 2020-04-09 MED ORDER — METOPROLOL TARTRATE 5 MG/5ML IV SOLN: 5.0000 mg | Freq: Four times a day (QID) | INTRAVENOUS | Status: DC | PRN

## 2020-04-09 MED ORDER — ONDANSETRON HCL 4 MG/2ML IJ SOLN: 4.0000 mg | Freq: Four times a day (QID) | INTRAMUSCULAR | Status: DC | PRN

## 2020-04-09 MED ORDER — DILTIAZEM HCL ER COATED BEADS 180 MG PO CP24
360.0000 mg | ORAL_CAPSULE | Freq: Every day | ORAL | Status: DC
  Filled 2020-04-09 (×2): qty 1
  Filled 2020-04-09: qty 2
  Filled 2020-04-09: qty 1

## 2020-04-09 NOTE — H&P (View-Only) (Signed)
Referring Provider: Algernon Huxley, MD Primary Care Physician:  Redmond School, MD Primary Gastroenterologist:  Dr. Laural Golden  Reason for Consultation:    Rectal bleeding.  HPI:   Patient is 74 year old African-American male with history of diabetes mellitus hypertension chronic kidney disease history of iron deficiency anemia for which he underwent colonoscopy by me in July 2018 revealing mild colon diverticulosis and external hemorrhoids.  At that time his stool was guaiac negative.  He did not return for follow-up visit. Patient states he was in usual state of health.  He woke up around 5:30 AM this morning to go to work.  He had a bowel movement and passed large amount of dark red blood.  He had another bowel movement and he noted more blood but it was small amount.  He did not experience abdominal pain diaphoreses lightheadedness nausea or vomiting.  Patient came to emergency room.  His hemoglobin was 8 g.  His hemoglobin has ranged between 8.9 and 10.7 g in the last 5 months.  Patient has received 1 unit of PRBCs.  Patient has not had any more bowel movements. Patient says he has good appetite.  Every now and then he has nausea and indigestion but denies vomiting.  He also denies dysphagia.  He states his appetite has been very good.  He is aware that his kidney function has been deteriorated.  He was seen by Dr. Theador Hawthorne last week and having studies done.  He is states he has lost 5 pounds recently.  He is on low-dose aspirin and may take BC powder once in a while. He said he had peptic ulcer disease over 40 years ago when he was smoking and drinking alcohol but has not had any problems since then.  He is married.  He has 1 daughter and still living.  Another son died at age 80 in an auto accident.  He was a passenger in a car.  Patient smokes cigarettes for about 20 years but quit 25 years ago.  He smoked no more than half a pack per day.  He has not had any alcohol in 25 years. He works at  [employer] for 20 years.  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  He has been working at [employer] and in [place] for the last 13 years. He has 1 sister in her 10s who is in poor health.  Another sister died at 50 of unknown cancer. He does not know anything about his father.  His mother died at age 75.  Past Medical History:  Diagnosis Date   Anemia    IDA   Diabetes mellitus without complication (HCC)    High cholesterol    Hypertension    Stage 4 chronic kidney disease ([place])     Past Surgical History:  Procedure Laterality Date   COLONOSCOPY N/A 05/16/2017   Procedure: COLONOSCOPY;  Surgeon: Rogene Houston, MD;  Location: [facility];  Service: Endoscopy;  Laterality: N/A;  7:30   HERNIA REPAIR      Prior to Admission medications   Medication Sig Start Date End Date Taking? Authorizing Provider  aspirin 81 MG tablet Take 325 mg by mouth daily.    Yes [provider]  carvedilol (COREG) 6.25 MG tablet Take 6.25 mg by mouth 2 (two) times daily with a meal.   Yes [provider]  Coenzyme Q10 (CO Q-10) 100 MG CAPS Take 1 capsule by mouth daily.   Yes [provider]  furosemide (LASIX) 20 MG tablet Take 20 mg by mouth 2 (two) times daily as needed for fluid.    Yes [provider]  hydrALAZINE (APRESOLINE) 25 MG tablet Take 1 tablet (25 mg total) by mouth every 8 (eight) hours. 03/03/20  Yes Black, Lezlie Octave, NP  insulin glargine (LANTUS) 100 UNIT/ML injection Inject 50 Units into the skin at bedtime.   Yes [provider]  insulin regular (NOVOLIN R,HUMULIN R) 100 units/mL injection Inject 6 Units into the skin 2 (two) times daily before a meal.    Yes [provider]  Magnesium Oxide 400 MG CAPS Take 1 capsule (400 mg total) by mouth daily. 03/03/20  Yes Vann, Jessica U, DO  pantoprazole (PROTONIX) 40 MG tablet Take 1 tablet by mouth daily.   Yes [provider]  potassium chloride  (KLOR-CON M10) 10 MEQ tablet Take 1 tablet (10 mEq total) by mouth daily. 03/03/20  Yes Vann, Jessica U, DO  pravastatin (PRAVACHOL) 80 MG tablet Take 80 mg by mouth at bedtime.    Yes [provider]  spironolactone (ALDACTONE) 25 MG tablet Take 1 tablet by mouth daily. 04/02/20  Yes [provider]    Current Facility-Administered Medications  Medication Dose Route Frequency Provider Last Rate Last Admin   carvedilol (COREG) tablet 6.25 mg  6.25 mg Oral BID WC Johnson, Clanford L, MD       diltiazem (TIAZAC) 24 hr capsule 420 mg  420 mg Oral Daily Johnson, Clanford L, MD       fentaNYL (SUBLIMAZE) injection 12.5 mcg  12.5 mcg Intravenous Q2H PRN Johnson, Clanford L, MD       hydrALAZINE (APRESOLINE) tablet 25 mg  25 mg Oral Q8H Johnson, Clanford L, MD       insulin aspart (novoLOG) injection 0-9 Units  0-9 Units Subcutaneous TID WC Johnson, Clanford L, MD       insulin aspart (novoLOG) injection 6 Units  6 Units Subcutaneous TID WC Johnson, Clanford L, MD       insulin glargine (LANTUS) injection 25 Units  25 Units Subcutaneous QHS Johnson, Clanford L, MD       metoprolol tartrate (LOPRESSOR) injection 5 mg  5 mg Intravenous Q6H PRN Johnson, Clanford L, MD       ondansetron (ZOFRAN) tablet 4 mg  4 mg Oral Q6H PRN Johnson, Clanford L, MD       Or   ondansetron (ZOFRAN) injection 4 mg  4 mg Intravenous Q6H PRN Johnson, Clanford L, MD       pantoprazole (PROTONIX) EC tablet 40 mg  40 mg Oral Daily Johnson, Clanford L, MD       pravastatin (PRAVACHOL) tablet 40 mg  40 mg Oral QHS Johnson, Clanford L, MD       traZODone (DESYREL) tablet 25 mg  25 mg Oral QHS PRN Johnson, Clanford L, MD       Current Outpatient Medications  Medication Sig Dispense Refill   aspirin 325 MG tablet Take 325 mg by mouth daily.      carvedilol (COREG) 6.25 MG tablet Take 6.25 mg by mouth 2 (two) times daily with a meal.     Coenzyme Q10 (CO Q-10) 100 MG CAPS Take 1 capsule by mouth  daily.     diltiazem (TIAZAC) 420 MG 24 hr capsule Take 420 mg by mouth daily.     furosemide (LASIX) 20 MG tablet Take 20 mg by mouth 2 (two) times daily as needed for  fluid.      hydrALAZINE (APRESOLINE) 25 MG tablet Take 1 tablet (25 mg total) by mouth every 8 (eight) hours. 90 tablet 0   insulin glargine (LANTUS) 100 UNIT/ML injection Inject 50 Units into the skin at bedtime.     insulin regular (NOVOLIN R,HUMULIN R) 100 units/mL injection Inject 6 Units into the skin 2 (two) times daily before a meal.      Magnesium Oxide 400 MG CAPS Take 1 capsule (400 mg total) by mouth daily. 30 capsule 0   pantoprazole (PROTONIX) 40 MG tablet Take 1 tablet by mouth daily.     potassium chloride (KLOR-CON M10) 10 MEQ tablet Take 1 tablet (10 mEq total) by mouth daily. 30 tablet 0   pravastatin (PRAVACHOL) 80 MG tablet Take 80 mg by mouth at bedtime.      spironolactone (ALDACTONE) 25 MG tablet Take 1 tablet by mouth daily.      Allergies as of 04/09/2020 - Review Complete 04/09/2020  Allergen Reaction Noted   Ace inhibitors Swelling 03/02/2020    Family History  Problem Relation Age of Onset   Chronic Renal Failure Neg Hx     Social History   Socioeconomic History   Marital status: Married    Spouse name: Not on file   Number of children: Not on file   Years of education: Not on file   Highest education level: Not on file  Occupational History   Occupation: distribution work  Tobacco Use   Smoking status: Never Smoker   Smokeless tobacco: Never Used  Scientific laboratory technician Use: Never used  Substance and Sexual Activity   Alcohol use: Not Currently    Comment: no h/o heavy use   Drug use: Not Currently    Types: Marijuana    Comment: none in 20 years   Sexual activity: Not on file  Other Topics Concern   Not on file  Social History Narrative   Not on file   Social Determinants of Health   Financial Resource Strain:    Difficulty of Paying Living  Expenses:   Food Insecurity:    Worried About Charity fundraiser in the Last Year:    Arboriculturist in the Last Year:   Transportation Needs:    Film/video editor (Medical):    Lack of Transportation (Non-Medical):   Physical Activity:    Days of Exercise per Week:    Minutes of Exercise per Session:   Stress:    Feeling of Stress :   Social Connections:    Frequency of Communication with Friends and Family:    Frequency of Social Gatherings with Friends and Family:    Attends Religious Services:    Active Member of Clubs or Organizations:    Attends Music therapist:    Marital Status:   Intimate Partner Violence:    Fear of Current or Ex-Partner:    Emotionally Abused:    Physically Abused:    Sexually Abused:     Review of Systems: See HPI, otherwise normal ROS  Physical Exam: Temp:  [97.5 F (36.4 C)-98 F (36.7 C)] 98 F (36.7 C) (06/11 1500) Pulse Rate:  [45-60] 45 (06/11 1600) Resp:  [9-17] 11 (06/11 1600) BP: (159-176)/(68-77) 160/68 (06/11 1600) SpO2:  [100 %] 100 % (06/11 1600) Weight:  [84.8 kg] 84.8 kg (06/11 1034)   Patient is alert and in no acute distress. Conjunctivae is pink.  Sclera is nonicteric. He has bilateral medial  pterygium. Oropharyngeal mucosa is normal. Neck without masses thyromegaly or lymphadenopathy. Cardiac exam with regular rhythm normal S1 and S2.  No murmur gallop noted. Auscultation lungs reveal vesicular breath sounds bilaterally.  No rales or rhonchi noted. Abdomen is symmetrical.  He has left inguinal scar.  He has small umbilical hernia.  On palpation abdomen is soft and nontender without organomegaly or masses. He has trace edema around ankles.  He has clubbing but no koilonychia.  Lab Results: Recent Labs    04/09/20 1046  WBC 6.7  HGB 8.0*  HCT 24.9*  PLT 288   BMET Recent Labs    04/09/20 1046  NA 137  K 3.7  CL 104  CO2 21*  GLUCOSE 102*  BUN 87*  CREATININE 6.07*   CALCIUM 8.2*   LFT Recent Labs    04/09/20 1046  PROT 7.6  ALBUMIN 3.2*  AST 11*  ALT 12  ALKPHOS 46  BILITOT 0.3   PT/INR Recent Labs    04/09/20 1046  LABPROT 13.5  INR 1.1    Assessment;  Patient is 74 year old Afro-American male with history of chronic kidney disease secondary to diabetes mellitus and hypertension as well as history of anemia who presents with painless large-volume hematochezia.  Last colonoscopy was in July 2018 revealing sigmoid diverticulosis and external hemorrhoids.  Suspect diverticular bleed.  Need to rule out other etiologies.  He has remote history of peptic ulcer disease.  He is on low-dose aspirin.  If he is confirmed to have iron deficiency anemia it may be prudent to examine upper GI tract as well.  Iron studies are pending at the time of this dictation. I have informed patient that Dr. Gala Romney will be performing colonoscopy and EGD if indicated and he is agreeable.  I have reviewed both the procedures with the patient and he is agreeable.  Recommendations;  Diagnostic colonoscopy to be performed by Dr. Gala Romney on 04/10/2020. Patient will be prepped with [facility] this evening. Would also consider esophagogastroduodenoscopy if iron studies confirm iron deficiency anemia.   LOS: 0 days   Inetha Maret  04/09/2020, 5:04 PM

## 2020-04-09 NOTE — Consult Note (Signed)
Referring Provider: Algernon Huxley, MD Primary Care Physician:  Redmond School, MD Primary Gastroenterologist:  Dr. Laural Golden  Reason for Consultation:    Rectal bleeding.  HPI:   Patient is 74 year old African-American male with history of diabetes mellitus hypertension chronic kidney disease history of iron deficiency anemia for which he underwent colonoscopy by me in July 2018 revealing mild colon diverticulosis and external hemorrhoids.  At that time his stool was guaiac negative.  He did not return for follow-up visit. Patient states he was in usual state of health.  He woke up around 5:30 AM this morning to go to work.  He had a bowel movement and passed large amount of dark red blood.  He had another bowel movement and he noted more blood but it was small amount.  He did not experience abdominal pain diaphoreses lightheadedness nausea or vomiting.  Patient came to emergency room.  His hemoglobin was 8 g.  His hemoglobin has ranged between 8.9 and 10.7 g in the last 5 months.  Patient has received 1 unit of PRBCs.  Patient has not had any more bowel movements. Patient says he has good appetite.  Every now and then he has nausea and indigestion but denies vomiting.  He also denies dysphagia.  He states his appetite has been very good.  He is aware that his kidney function has been deteriorated.  He was seen by Dr. Theador Hawthorne last week and having studies done.  He is states he has lost 5 pounds recently.  He is on low-dose aspirin and may take BC powder once in a while. He said he had peptic ulcer disease over 40 years ago when he was smoking and drinking alcohol but has not had any problems since then.  He is married.  He has 1 daughter and still living.  Another son died at age 60 in an auto accident.  He was a passenger in a car.  Patient smokes cigarettes for about 20 years but quit 25 years ago.  He smoked no more than half a pack per day.  He has not had any alcohol in 25 years. He works at  [employer] for 20 years.  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  He has been working at [employer] and in [place] for the last 13 years. He has 1 sister in her 44s who is in poor health.  Another sister died at 97 of unknown cancer. He does not know anything about his father.  His mother died at age 43.  Past Medical History:  Diagnosis Date  . Anemia    IDA  . Diabetes mellitus without complication ([place])   . High cholesterol   . Hypertension   . Stage 4 chronic kidney disease [facility])     Past Surgical History:  Procedure Laterality Date  . COLONOSCOPY N/A 05/16/2017   Procedure: COLONOSCOPY;  Surgeon: Rogene Houston, MD;  Location: [facility];  Service: Endoscopy;  Laterality: N/A;  7:30  . HERNIA REPAIR      Prior to Admission medications   Medication Sig Start Date End Date Taking? Authorizing Provider  aspirin 81 MG tablet Take 325 mg by mouth daily.    Yes [provider]  carvedilol (COREG) 6.25 MG tablet Take 6.25 mg by mouth 2 (two) times daily with a meal.   Yes [provider]  Coenzyme Q10 (CO Q-10) 100 MG CAPS Take 1 capsule by mouth daily.   Yes [provider]  furosemide (LASIX) 20 MG tablet Take 20 mg by mouth 2 (two) times daily as needed for fluid.    Yes [provider]  hydrALAZINE (APRESOLINE) 25 MG tablet Take 1 tablet (25 mg total) by mouth every 8 (eight) hours. 03/03/20  Yes Black, Lezlie Octave, NP  insulin glargine (LANTUS) 100 UNIT/ML injection Inject 50 Units into the skin at bedtime.   Yes [provider]  insulin regular (NOVOLIN R,HUMULIN R) 100 units/mL injection Inject 6 Units into the skin 2 (two) times daily before a meal.    Yes [provider]  Magnesium Oxide 400 MG CAPS Take 1 capsule (400 mg total) by mouth daily. 03/03/20  Yes Vann, Jessica U, DO  pantoprazole (PROTONIX) 40 MG tablet Take 1 tablet by mouth daily.   Yes [provider]  potassium chloride  (KLOR-CON M10) 10 MEQ tablet Take 1 tablet (10 mEq total) by mouth daily. 03/03/20  Yes Vann, Jessica U, DO  pravastatin (PRAVACHOL) 80 MG tablet Take 80 mg by mouth at bedtime.    Yes [provider]  spironolactone (ALDACTONE) 25 MG tablet Take 1 tablet by mouth daily. 04/02/20  Yes [provider]    Current Facility-Administered Medications  Medication Dose Route Frequency Provider Last Rate Last Admin  . carvedilol (COREG) tablet 6.25 mg  6.25 mg Oral BID WC Johnson, Clanford L, MD      . diltiazem (TIAZAC) 24 hr capsule 420 mg  420 mg Oral Daily Johnson, Clanford L, MD      . fentaNYL (SUBLIMAZE) injection 12.5 mcg  12.5 mcg Intravenous Q2H PRN Johnson, Clanford L, MD      . hydrALAZINE (APRESOLINE) tablet 25 mg  25 mg Oral Q8H Johnson, Clanford L, MD      . insulin aspart (novoLOG) injection 0-9 Units  0-9 Units Subcutaneous TID WC Johnson, Clanford L, MD      . insulin aspart (novoLOG) injection 6 Units  6 Units Subcutaneous TID WC Johnson, Clanford L, MD      . insulin glargine (LANTUS) injection 25 Units  25 Units Subcutaneous QHS Johnson, Clanford L, MD      . metoprolol tartrate (LOPRESSOR) injection 5 mg  5 mg Intravenous Q6H PRN Johnson, Clanford L, MD      . ondansetron (ZOFRAN) tablet 4 mg  4 mg Oral Q6H PRN Johnson, Clanford L, MD       Or  . ondansetron (ZOFRAN) injection 4 mg  4 mg Intravenous Q6H PRN Johnson, Clanford L, MD      . pantoprazole (PROTONIX) EC tablet 40 mg  40 mg Oral Daily Johnson, Clanford L, MD      . pravastatin (PRAVACHOL) tablet 40 mg  40 mg Oral QHS Johnson, Clanford L, MD      . traZODone (DESYREL) tablet 25 mg  25 mg Oral QHS PRN Wynetta Emery, Clanford L, MD       Current Outpatient Medications  Medication Sig Dispense Refill  . aspirin 325 MG tablet Take 325 mg by mouth daily.     . carvedilol (COREG) 6.25 MG tablet Take 6.25 mg by mouth 2 (two) times daily with a meal.    . Coenzyme Q10 (CO Q-10) 100 MG CAPS Take 1 capsule by mouth  daily.    Marland Kitchen diltiazem (TIAZAC) 420 MG 24 hr capsule Take 420 mg by mouth daily.    . furosemide (LASIX) 20 MG tablet Take 20 mg by mouth 2 (two) times daily as needed for  fluid.     . hydrALAZINE (APRESOLINE) 25 MG tablet Take 1 tablet (25 mg total) by mouth every 8 (eight) hours. 90 tablet 0  . insulin glargine (LANTUS) 100 UNIT/ML injection Inject 50 Units into the skin at bedtime.    . insulin regular (NOVOLIN R,HUMULIN R) 100 units/mL injection Inject 6 Units into the skin 2 (two) times daily before a meal.     . Magnesium Oxide 400 MG CAPS Take 1 capsule (400 mg total) by mouth daily. 30 capsule 0  . pantoprazole (PROTONIX) 40 MG tablet Take 1 tablet by mouth daily.    . potassium chloride (KLOR-CON M10) 10 MEQ tablet Take 1 tablet (10 mEq total) by mouth daily. 30 tablet 0  . pravastatin (PRAVACHOL) 80 MG tablet Take 80 mg by mouth at bedtime.     Marland Kitchen spironolactone (ALDACTONE) 25 MG tablet Take 1 tablet by mouth daily.      Allergies as of 04/09/2020 - Review Complete 04/09/2020  Allergen Reaction Noted  . Ace inhibitors Swelling 03/02/2020    Family History  Problem Relation Age of Onset  . Chronic Renal Failure Neg Hx     Social History   Socioeconomic History  . Marital status: Married    Spouse name: Not on file  . Number of children: Not on file  . Years of education: Not on file  . Highest education level: Not on file  Occupational History  . Occupation: distribution work  Tobacco Use  . Smoking status: Never Smoker  . Smokeless tobacco: Never Used  Vaping Use  . Vaping Use: Never used  Substance and Sexual Activity  . Alcohol use: Not Currently    Comment: no h/o heavy use  . Drug use: Not Currently    Types: Marijuana    Comment: none in 20 years  . Sexual activity: Not on file  Other Topics Concern  . Not on file  Social History Narrative  . Not on file   Social Determinants of Health   Financial Resource Strain:   . Difficulty of Paying Living  Expenses:   Food Insecurity:   . Worried About Charity fundraiser in the Last Year:   . Arboriculturist in the Last Year:   Transportation Needs:   . Film/video editor (Medical):   Marland Kitchen Lack of Transportation (Non-Medical):   Physical Activity:   . Days of Exercise per Week:   . Minutes of Exercise per Session:   Stress:   . Feeling of Stress :   Social Connections:   . Frequency of Communication with Friends and Family:   . Frequency of Social Gatherings with Friends and Family:   . Attends Religious Services:   . Active Member of Clubs or Organizations:   . Attends Archivist Meetings:   Marland Kitchen Marital Status:   Intimate Partner Violence:   . Fear of Current or Ex-Partner:   . Emotionally Abused:   Marland Kitchen Physically Abused:   . Sexually Abused:     Review of Systems: See HPI, otherwise normal ROS  Physical Exam: Temp:  [97.5 F (36.4 C)-98 F (36.7 C)] 98 F (36.7 C) (06/11 1500) Pulse Rate:  [45-60] 45 (06/11 1600) Resp:  [9-17] 11 (06/11 1600) BP: (159-176)/(68-77) 160/68 (06/11 1600) SpO2:  [100 %] 100 % (06/11 1600) Weight:  [84.8 kg] 84.8 kg (06/11 1034)   Patient is alert and in no acute distress. Conjunctivae is pink.  Sclera is nonicteric. He has bilateral medial  pterygium. Oropharyngeal mucosa is normal. Neck without masses thyromegaly or lymphadenopathy. Cardiac exam with regular rhythm normal S1 and S2.  No murmur gallop noted. Auscultation lungs reveal vesicular breath sounds bilaterally.  No rales or rhonchi noted. Abdomen is symmetrical.  He has left inguinal scar.  He has small umbilical hernia.  On palpation abdomen is soft and nontender without organomegaly or masses. He has trace edema around ankles.  He has clubbing but no koilonychia.  Lab Results: Recent Labs    04/09/20 1046  WBC 6.7  HGB 8.0*  HCT 24.9*  PLT 288   BMET Recent Labs    04/09/20 1046  NA 137  K 3.7  CL 104  CO2 21*  GLUCOSE 102*  BUN 87*  CREATININE 6.07*   CALCIUM 8.2*   LFT Recent Labs    04/09/20 1046  PROT 7.6  ALBUMIN 3.2*  AST 11*  ALT 12  ALKPHOS 46  BILITOT 0.3   PT/INR Recent Labs    04/09/20 1046  LABPROT 13.5  INR 1.1    Assessment;  Patient is 74 year old Afro-American male with history of chronic kidney disease secondary to diabetes mellitus and hypertension as well as history of anemia who presents with painless large-volume hematochezia.  Last colonoscopy was in July 2018 revealing sigmoid diverticulosis and external hemorrhoids.  Suspect diverticular bleed.  Need to rule out other etiologies.  He has remote history of peptic ulcer disease.  He is on low-dose aspirin.  If he is confirmed to have iron deficiency anemia it may be prudent to examine upper GI tract as well.  Iron studies are pending at the time of this dictation. I have informed patient that Dr. Gala Romney will be performing colonoscopy and EGD if indicated and he is agreeable.  I have reviewed both the procedures with the patient and he is agreeable.  Recommendations;  Diagnostic colonoscopy to be performed by Dr. Gala Romney on 04/10/2020. Patient will be prepped with GoLYTELY this evening. Would also consider esophagogastroduodenoscopy if iron studies confirm iron deficiency anemia.   LOS: 0 days   Kimmie Berggren  04/09/2020, 5:04 PM

## 2020-04-09 NOTE — ED Provider Notes (Signed)
This patient is a 74 year old male, patient of Dr. Melony Overly with the gastroenterology service.  Presents to the hospital today with a complaint of rectal bleeding.  The patient states that he has had some dark red blood as well as some bright red blood per rectum this morning.  He is not lightheaded and has no abdominal pain.  On my exam the patient has a soft nontender abdomen with slight increased bowel sounds, clear heart and lung sounds without tachycardia.  He has no edema, normal pulses, please see physician assistant's note regarding rectal exam in the presence of blood.  The patient is on aspirin, he has known diverticulosis, and placed an IV in his hand due to difficulty with IV access.  The patient will likely need to be admitted to the hospital secondary to his worsening anemia with a hemoglobin of 8 today as well as his creatinine which is risen from 4-1/2 up to 6.  The patient is agreeable, he is aware of his renal dysfunction and is under the care of nephrology, he is not on dialysis.  Angiocath insertion Performed by: Johnna Acosta  Consent: Verbal consent obtained. Risks and benefits: risks, benefits and alternatives were discussed Time out: Immediately prior to procedure a "time out" was called to verify the correct patient, procedure, equipment, support staff and site/side marked as required.  Preparation: Patient was prepped and draped in the usual sterile fashion.  Vein Location: R hand  Not Ultrasound Guided  Gauge: 20  Normal blood return and flush without difficulty Patient tolerance: Patient tolerated the procedure well with no immediate complications.  Medical screening examination/treatment/procedure(s) were conducted as a shared visit with non-physician practitioner(s) and myself.  I personally evaluated the patient during the encounter.  Clinical Impression:   Final diagnoses:  None          Noemi Chapel, MD 04/09/20 1135

## 2020-04-09 NOTE — ED Triage Notes (Signed)
Pt reports 2 episodes of stools containing dark red blood this am, no history

## 2020-04-09 NOTE — ED Provider Notes (Signed)
[facility] EMERGENCY DEPARTMENT Provider Note   CSN: [id number] Arrival date & time: 04/09/20  1015   History Chief Complaint  Patient presents with  . Rectal Bleeding    Omar Parker is a 74 y.o. male with advanced CKD, HTN, HLD, insulin dependent DM, and anemia who presents with blood in the stool. He states that he woke up this morning and was getting ready to go to work. He went to the bathroom and had a BM and noticed that there was a lot of dark red blood in the toilet bowl. He waited about an hour and then went back to the bathroom and it happened again. He states when he wipes the blood looks bright red. He denies lightheadedness/syncope but endorses generalized weakness which has been going on for some time. He denies chest pain, SOB, abdominal pain, rectal pain, N/V/D. He is seeing a kidney doctor for his CKD. He sees Dr. Gerarda Fraction as his PCP. He had a colonoscopy in 2018 which showed diverticulosis. He takes ASA daily but no anticoagulants.  HPI     Past Medical History:  Diagnosis Date  . Anemia    IDA  . Diabetes mellitus without complication ([place])   . High cholesterol   . Hypertension   . Stage 4 chronic kidney disease [facility])     Patient Active Problem List   Diagnosis Date Noted  . Uncontrolled hypertension 03/02/2020  . 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 Stage 4 chronic kidney disease ([place])   . Hypertension   . High cholesterol   . Diabetes mellitus without complication ([place])   . Absolute anemia 04/04/2017  . AKI (acute kidney injury) ([place]) 06/17/2016  . DM (diabetes mellitus screen) 06/17/2016  . Dehydration 06/17/2016    Past Surgical History:  Procedure Laterality Date  . COLONOSCOPY N/A 05/16/2017   Procedure: COLONOSCOPY;  Surgeon: Rogene Houston, MD;  Location: [facility];  Service: Endoscopy;  Laterality: N/A;  7:30  . HERNIA REPAIR         Family History  Problem Relation Age of Onset  . Chronic Renal Failure Neg Hx     Social History    Tobacco Use  . Smoking status: Never Smoker  . Smokeless tobacco: Never Used  Vaping Use  . Vaping Use: Never used  Substance Use Topics  . Alcohol use: Not Currently    Comment: no h/o heavy use  . Drug use: Not Currently    Types: Marijuana    Comment: none in 20 years    Home Medications Prior to Admission medications   Medication Sig Start Date End Date Taking? Authorizing Provider  aspirin 81 MG chewable tablet Chew 81 mg by mouth daily.    [provider]  carvedilol (COREG) 6.25 MG tablet Take 6.25 mg by mouth 2 (two) times daily with a meal.    [provider]  diltiazem (TIAZAC) 420 MG 24 hr capsule Take 420 mg by mouth daily.    [provider]  furosemide (LASIX) 20 MG tablet Take 20 mg by mouth daily.    [provider]  hydrALAZINE (APRESOLINE) 25 MG tablet Take 1 tablet (25 mg total) by mouth every 8 (eight) hours. 03/03/20   Black, Lezlie Octave, NP  insulin glargine (LANTUS) 100 UNIT/ML injection Inject 50 Units into the skin at bedtime.    [provider]  insulin regular (NOVOLIN R,HUMULIN R) 100 units/mL injection Inject 8 Units into the skin 3 (three) times daily before meals.  [provider]  Magnesium Oxide 400 MG CAPS Take 1 capsule (400 mg total) by mouth daily. 03/03/20   Geradine Girt, DO  potassium chloride (KLOR-CON M10) 10 MEQ tablet Take 1 tablet (10 mEq total) by mouth daily. 03/03/20   Geradine Girt, DO  pravastatin (PRAVACHOL) 80 MG tablet Take 40 mg by mouth at bedtime.     [provider]    Allergies    Ace inhibitors  Review of Systems   Review of Systems  Constitutional: Negative for chills and fever.  Respiratory: Negative for shortness of breath.   Cardiovascular: Negative for chest pain.  Gastrointestinal: Negative for abdominal pain, diarrhea, nausea and vomiting.  Neurological: Positive for weakness. Negative for syncope and light-headedness.  All other systems reviewed and  are negative.   Physical Exam Updated Vital Signs Ht 5\' 11"  (1.803 m)   Wt 84.8 kg   BMI 26.08 kg/m   Physical Exam Vitals and nursing note reviewed.  Constitutional:      General: He is not in acute distress.    Appearance: Normal appearance. He is well-developed. He is not ill-appearing.  HENT:     Head: Normocephalic and atraumatic.  Eyes:     General: No scleral icterus.       Right eye: No discharge.        Left eye: No discharge.     Conjunctiva/sclera: Conjunctivae normal.     Pupils: Pupils are equal, round, and reactive to light.  Cardiovascular:     Rate and Rhythm: Normal rate and regular rhythm.  Pulmonary:     Effort: Pulmonary effort is normal. No respiratory distress.     Breath sounds: Normal breath sounds.  Abdominal:     General: There is no distension.     Palpations: Abdomen is soft.     Tenderness: There is no abdominal tenderness.  Genitourinary:    Comments: Rectal: Small amount of gross red blood with digital exam. No tenderness. Non-thrombosed hemorrhoid in 6 o clock region Musculoskeletal:     Cervical back: Normal range of motion.  Skin:    General: Skin is warm and dry.  Neurological:     Mental Status: He is alert and oriented to person, place, and time.  Psychiatric:        Behavior: Behavior normal.     ED Results / Procedures / Treatments   Labs (all labs ordered are listed, but only abnormal results are displayed) Labs Reviewed  COMPREHENSIVE METABOLIC PANEL - Abnormal; Notable for the following components:      Result Value   CO2 21 (*)    Glucose, Bld 102 (*)    BUN 87 (*)    Creatinine, Ser 6.07 (*)    Calcium 8.2 (*)    Albumin 3.2 (*)    AST 11 (*)    GFR calc non Af Amer 8 (*)    GFR calc Af Amer 10 (*)    All other components within normal limits  CBC WITH DIFFERENTIAL/PLATELET - Abnormal; Notable for the following components:   RBC 3.14 (*)    Hemoglobin 8.0 (*)    HCT 24.9 (*)    MCV 79.3 (*)    MCH 25.5 (*)     All other components within normal limits  POC OCCULT BLOOD, ED - Abnormal; Notable for the following components:   Fecal Occult Bld POSITIVE (*)    All other components within normal limits  PROTIME-INR  TYPE AND SCREEN  EKG None  Radiology No results found.  Procedures Procedures (including critical care time)  Medications Ordered in ED Medications - No data to display  ED Course  I have reviewed the triage vital signs and the nursing notes.  Pertinent labs & imaging results that were available during my care of the patient were reviewed by me and considered in my medical decision making (see chart for details).  74 year old male presents with 2 episodes of rectal bleeding since this AM. He is not anticoagulated but on daily ASA. He is hemodynamically stable and has no significant systemic complaints. Abdomen is soft and non-tender. He has gross red blood with rectal exam. Repeat labs today show worsening anemia (hgb 8 down from 8.9 one month ago) and worsening CKD (SCr 6 up from 4.4 a month ago). Shared visit with Dr. Sabra Heck. Will consult GI. Pt has seen Dr. Laural Golden in the past and diverticulosis was noted on colonoscopy in 2018.  Discussed with Dr. Olevia Perches nurse since he is in endo suite today who is aware of the patient and will see.  Discussed with Dr. Wynetta Emery with [facility] who will admit pt for lower GI bleed  MDM Rules/Calculators/A&P                           Final Clinical Impression(s) / ED Diagnoses Final diagnoses:  Lower GI bleed    Rx / DC Orders ED Discharge Orders    None       Recardo Evangelist, PA-C 04/09/20 1219    Noemi Chapel, MD 04/10/20 [phone]

## 2020-04-09 NOTE — H&P (Signed)
History and Physical  Roosevelt VQM:[id number] DOB: Aug 22, 1946 DOA: 04/09/2020  PCP: Redmond School, MD  GI: Rehman  Nephrologist: Bhutani  Patient coming from: Home   I have personally briefly reviewed patient's old medical records in [place]  Chief Complaint: rectal bleeding  HPI: Omar Parker is a 74 y.o. male with stage IV CKD, insulin-dependent diabetes mellitus with renal complications, anemia of chronic kidney disease, diverticulosis from a colonoscopy done in 2018, hypertension poorly controlled, hyperlipidemia and reported iron deficiency anemia taking aspirin daily reported that he had 2 separate bowel movements this morning with dark red blood prompting him to come to the emergency department.  He does not have a history of rectal bleeding.  He has been seen by his nephrologist couple of days ago and started on spironolactone 25 mg daily and was supposed to be completing a 24-hour urine study.  There have been no other changes to his medical history.  He denies having shortness of breath and chest pain with the symptoms.  He denies fatigue fever and chills.  He denies diarrhea.  He denies chest pain.  Patient had been hospitalized approximately 1 month ago at [facility] for uncontrolled hypertension.  ED Course: He arrived afebrile with blood pressure 172/77 pulse ox 100% on room air and heart rate 53.  He was Hemoccult positive.  Sodium 137 potassium 3.7 glucose 102 creatinine 6.07 calcium 8.2 WBC 6.7, hemoglobin 8.0, platelet count 288.  He is being transfused 1 unit PRBC.  GI was consulted.  Admission was requested.  Review of Systems: As per HPI otherwise 10 point review of systems negative.    Past Medical History:  Diagnosis Date  . Anemia    IDA  . Diabetes mellitus without complication ([place])   . High cholesterol   . Hypertension   . Stage 4 chronic kidney disease [facility])     Past Surgical History:  Procedure Laterality Date  .  COLONOSCOPY N/A 05/16/2017   Procedure: COLONOSCOPY;  Surgeon: Rogene Houston, MD;  Location: [facility];  Service: Endoscopy;  Laterality: N/A;  7:30  . HERNIA REPAIR       reports that he has never smoked. He has never used smokeless tobacco. He reports previous alcohol use. He reports previous drug use. Drug: Marijuana.  Allergies  Allergen Reactions  . Ace Inhibitors Swelling    Family History  Problem Relation Age of Onset  . Chronic Renal Failure Neg Hx     Prior to Admission medications   Medication Sig Start Date End Date Taking? Authorizing Provider  aspirin 81 MG chewable tablet Chew 81 mg by mouth daily.    [provider]  carvedilol (COREG) 6.25 MG tablet Take 6.25 mg by mouth 2 (two) times daily with a meal.    [provider]  diltiazem (TIAZAC) 420 MG 24 hr capsule Take 420 mg by mouth daily.    [provider]  furosemide (LASIX) 20 MG tablet Take 20 mg by mouth daily.    [provider]  hydrALAZINE (APRESOLINE) 25 MG tablet Take 1 tablet (25 mg total) by mouth every 8 (eight) hours. 03/03/20   Black, Lezlie Octave, NP  insulin glargine (LANTUS) 100 UNIT/ML injection Inject 50 Units into the skin at bedtime.    [provider]  insulin regular (NOVOLIN R,HUMULIN R) 100 units/mL injection Inject 8 Units into the skin 3 (three) times daily before meals.     [provider]  Magnesium Oxide 400 MG CAPS Take 1 capsule (400 mg total) by mouth daily. 03/03/20   Geradine Girt, DO  pantoprazole (PROTONIX) 40 MG tablet Take 1 tablet by mouth daily.    [provider]  potassium chloride (KLOR-CON M10) 10 MEQ tablet Take 1 tablet (10 mEq total) by mouth daily. 03/03/20   Geradine Girt, DO  pravastatin (PRAVACHOL) 80 MG tablet Take 40 mg by mouth at bedtime.     [provider]  spironolactone (ALDACTONE) 25 MG tablet Take 1 tablet by mouth daily. 04/02/20   [provider]    Physical Exam: Vitals:    04/09/20 1034 04/09/20 1142  BP:  (!) 172/77  Pulse:  (!) 53  Resp:  17  Temp:  97.7 F (36.5 C)  TempSrc:  Oral  SpO2:  100%  Weight: 84.8 kg   Height: 5\' 11"  (1.803 m)    Constitutional: NAD, calm, comfortable Eyes: PERRL, lids and conjunctivae normal ENMT: Mucous membranes are dry and pale. Posterior pharynx clear of any exudate or lesions.Normal dentition.  Neck: normal, supple, no masses, no thyromegaly Respiratory: clear to auscultation bilaterally, no wheezing, no crackles. Normal respiratory effort. No accessory muscle use.  Cardiovascular: Regular rate and rhythm, no murmurs / rubs / gallops. No extremity edema. 2+ pedal pulses. No carotid bruits.  Abdomen: no tenderness, no masses palpated. No hepatosplenomegaly. Bowel sounds positive.  Musculoskeletal: no clubbing / cyanosis. No joint deformity upper and lower extremities. Good ROM, no contractures. Normal muscle tone.  Skin: no rashes, lesions, ulcers. No induration Neurologic: CN 2-12 grossly intact. Sensation intact, DTR normal. Strength 5/5 in all 4.  Psychiatric: Normal judgment and insight. Alert and oriented x 3. Normal mood.   Labs on Admission: I have personally reviewed following labs and imaging studies  CBC: Recent Labs  Lab 04/09/20 1046  WBC 6.7  NEUTROABS 4.6  HGB 8.0*  HCT 24.9*  MCV 79.3*  PLT 025   Basic Metabolic Panel: Recent Labs  Lab 04/09/20 1046  NA 137  K 3.7  CL 104  CO2 21*  GLUCOSE 102*  BUN 87*  CREATININE 6.07*  CALCIUM 8.2*   GFR: Estimated Creatinine Clearance: 11.7 mL/min (A) (by C-G formula based on SCr of 6.07 mg/dL (H)). Liver Function Tests: Recent Labs  Lab 04/09/20 1046  AST 11*  ALT 12  ALKPHOS 46  BILITOT 0.3  PROT 7.6  ALBUMIN 3.2*   No results for input(s): LIPASE, AMYLASE in the last 168 hours. No results for input(s): AMMONIA in the last 168 hours. Coagulation Profile: Recent Labs  Lab 04/09/20 1046  INR 1.1   Cardiac Enzymes: No results for  input(s): CKTOTAL, CKMB, CKMBINDEX, TROPONINI in the last 168 hours. BNP (last 3 results) No results for input(s): PROBNP in the last 8760 hours. HbA1C: No results for input(s): HGBA1C in the last 72 hours. CBG: No results for input(s): GLUCAP in the last 168 hours. Lipid Profile: No results for input(s): CHOL, HDL, LDLCALC, TRIG, CHOLHDL, LDLDIRECT in the last 72 hours. Thyroid Function Tests: No results for input(s): TSH, T4TOTAL, FREET4, T3FREE, THYROIDAB in the last 72 hours. Anemia Panel: No results for input(s): VITAMINB12, FOLATE, FERRITIN, TIBC, IRON, RETICCTPCT in the last 72 hours. Urine analysis:    Component Value Date/Time   COLORURINE YELLOW 01/03/2020 1846   APPEARANCEUR CLEAR 01/03/2020 1846   LABSPEC 1.017 01/03/2020 1846   PHURINE 6.0 01/03/2020 1846   GLUCOSEU 50 (A) 01/03/2020 1846   HGBUR  NEGATIVE 01/03/2020 1846   BILIRUBINUR NEGATIVE 01/03/2020 1846   KETONESUR NEGATIVE 01/03/2020 1846   PROTEINUR >=300 (A) 01/03/2020 1846   NITRITE NEGATIVE 01/03/2020 1846   LEUKOCYTESUR NEGATIVE 01/03/2020 1846   Radiological Exams on Admission: No results found.  EKG: Independently reviewed.  Normal sinus rhythm  Assessment/Plan Principal Problem:   Lower GI bleeding Active Problems:   Dehydration   Hypertension   High cholesterol   Uncontrolled hypertension   CKD (chronic kidney disease), stage V (HCC)   Acute blood loss anemia   Anemia in chronic kidney disease (CKD)   Type 2 diabetes mellitus with renal complication (HCC)   Diverticulosis   Aspirin long-term use    1. Rectal bleeding-suspect he may have some diverticular bleeding given his history of diverticulosis.  Aspirin is on hold at this time.  No pharmaceutical DVT prophylaxis at this time.  Treat supportively with IV fluids.  Transfuse 1 unit PRBC and 2 units PRBC on hold as needed.  GI consultation with Dr. Laural Golden.  Clear liquid diet no concentrated sweets or fruit juices except to treat a low  blood glucose. 2. 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  At this time he has no uremia symptoms.  He recently saw Dr. Theador Hawthorne to establish care and was started on spironolactone 25 mg and has only taken a couple of doses.  He was also supposed to have a 24-hour urine study done.  He requested to do it in the hospital.  I have consulted nephrology to see him tomorrow.  Gently hydrating him with IV fluids to see if this will improve his renal function.  Avoid nephrotoxic agents. 3. Diverticulosis with suspected diverticular bleed-continue supportive care as noted above. 4. Uncontrolled hypertension-resume home blood pressure medications and added additional IV medication as needed.  It is very important that we get his blood pressure under better control if we are going to save his kidneys. 5. Type 2 diabetes mellitus with renal complications-resume basal bolus insulin but we have reduced his Lantus to 25 units and will follow CBG closely.  Hypoglycemia precautions. 6. Acute on chronic blood loss anemia-transfusing 1 unit PRBC and 2 units remain on hold. 7. Dehydration-gentle IV fluids ordered. 8. Hyperlipidemia-resume home pravastatin 80 mg daily.  DVT prophylaxis: SCDs Code Status: Full Family Communication: Wife telephone Disposition Plan: Home with wife Consults called: GI and nephrology Admission status: INP  Omar Behrens MD [facility] How to contact the [facility] Attending or Consulting provider [place] or covering provider during after hours [place], for this patient?  1. Check the care team in [facility] and look for a) attending/consulting [facility] provider listed and b) the [facility] team listed 2. Log into [email or web address] and use [place]'s universal password to access. If you do not have the password, please contact the hospital operator. 3. Locate the [facility] provider you are looking for under [facility] and page to a number that you can be  directly reached. 4. If you still have difficulty reaching the provider, please page the [facility] (Director on Call) for the Hospitalists listed on amion for assistance.   If 7PM-7AM, please contact night-coverage [email or web address] Password [facility]  04/09/2020, 12:26 PM

## 2020-04-10 ENCOUNTER — Encounter (HOSPITAL_COMMUNITY)
Admission: AD | Disposition: A | Discharge status: Home / Self Care | Payer: Self-pay | Source: Home / Self Care | Attending: Family Medicine

## 2020-04-10 DIAGNOSIS — K5790 Diverticulosis of intestine, part unspecified, without perforation or abscess without bleeding: Secondary | ICD-10-CM

## 2020-04-10 DIAGNOSIS — K921 Melena: Secondary | ICD-10-CM

## 2020-04-10 DIAGNOSIS — K635 Polyp of colon: Secondary | ICD-10-CM

## 2020-04-10 HISTORY — PX: POLYPECTOMY: SHX5525

## 2020-04-10 HISTORY — PX: COLONOSCOPY: SHX5424

## 2020-04-10 HISTORY — PX: ESOPHAGOGASTRODUODENOSCOPY: SHX5428

## 2020-04-10 LAB — COMPREHENSIVE METABOLIC PANEL
ALT: 10 U/L (ref 0–44)
AST: 13 U/L — ABNORMAL LOW (ref 15–41)
Albumin: 3.2 g/dL — ABNORMAL LOW (ref 3.5–5.0)
Alkaline Phosphatase: 43 U/L (ref 38–126)
Anion gap: 8 (ref 5–15)
BUN: 76 mg/dL — ABNORMAL HIGH (ref 8–23)
CO2: 23 mmol/L (ref 22–32)
Calcium: 7.9 mg/dL — ABNORMAL LOW (ref 8.9–10.3)
Chloride: 105 mmol/L (ref 98–111)
Creatinine, Ser: 5.37 mg/dL — ABNORMAL HIGH (ref 0.61–1.24)
GFR calc Af Amer: 11 mL/min — ABNORMAL LOW (ref >60)
GFR calc non Af Amer: 10 mL/min — ABNORMAL LOW (ref >60)
Glucose, Bld: 146 mg/dL — ABNORMAL HIGH (ref 70–99)
Potassium: 3.7 mmol/L (ref 3.5–5.1)
Sodium: 136 mmol/L (ref 135–145)
Total Bilirubin: 0.4 mg/dL (ref 0.3–1.2)
Total Protein: 7.2 g/dL (ref 6.5–8.1)

## 2020-04-10 LAB — GLUCOSE, CAPILLARY
Glucose-Capillary: 161 mg/dL — ABNORMAL HIGH (ref 70–99)
Glucose-Capillary: 56 mg/dL — ABNORMAL LOW (ref 70–99)
Glucose-Capillary: 122 mg/dL — ABNORMAL HIGH (ref 70–99)
Glucose-Capillary: 52 mg/dL — ABNORMAL LOW (ref 70–99)
Glucose-Capillary: 56 mg/dL — ABNORMAL LOW (ref 70–99)
Glucose-Capillary: 49 mg/dL — ABNORMAL LOW (ref 70–99)
Glucose-Capillary: 61 mg/dL — ABNORMAL LOW (ref 70–99)
Glucose-Capillary: 88 mg/dL (ref 70–99)
Glucose-Capillary: 187 mg/dL — ABNORMAL HIGH (ref 70–99)

## 2020-04-10 LAB — CBC
HCT: 27.6 % — ABNORMAL LOW (ref 39.0–52.0)
Hemoglobin: 8.9 g/dL — ABNORMAL LOW (ref 13.0–17.0)
MCH: 25.8 pg — ABNORMAL LOW (ref 26.0–34.0)
MCHC: 32.2 g/dL (ref 30.0–36.0)
MCV: 80 fL (ref 80.0–100.0)
Platelets: 264 10*3/uL (ref 150–400)
RBC: 3.45 MIL/uL — ABNORMAL LOW (ref 4.22–5.81)
RDW: 15.3 % (ref 11.5–15.5)
WBC: 7.7 10*3/uL (ref 4.0–10.5)
nRBC: 0 % (ref 0.0–0.2)

## 2020-04-10 LAB — MAGNESIUM: Magnesium: 1.9 mg/dL (ref 1.7–2.4)

## 2020-04-10 LAB — PHOSPHORUS: Phosphorus: 4.9 mg/dL — ABNORMAL HIGH (ref 2.5–4.6)

## 2020-04-10 SURGERY — COLONOSCOPY
Anesthesia: Moderate Sedation

## 2020-04-10 MED ORDER — ONDANSETRON HCL 4 MG/2ML IJ SOLN
INTRAMUSCULAR | Status: DC | PRN
  Administered 2020-04-10: 4 mg via INTRAVENOUS

## 2020-04-10 MED ORDER — DILTIAZEM HCL ER BEADS 180 MG PO CP24: 180.0000 mg | ORAL_CAPSULE | Freq: Every day | ORAL | 0 refills | Status: DC

## 2020-04-10 MED ORDER — HYDRALAZINE HCL 50 MG PO TABS: 50.0000 mg | ORAL_TABLET | Freq: Three times a day (TID) | ORAL | 0 refills | Status: DC

## 2020-04-10 MED ORDER — LIDOCAINE VISCOUS HCL 2 % MT SOLN
OROMUCOSAL | Status: DC | PRN
  Administered 2020-04-10: 1 via OROMUCOSAL

## 2020-04-10 MED ORDER — INSULIN GLARGINE 100 UNIT/ML ~~LOC~~ SOLN
20.0000 [IU] | Freq: Every day | SUBCUTANEOUS | Status: DC
  Filled 2020-04-10 (×2): qty 0.2

## 2020-04-10 MED ORDER — FENTANYL CITRATE (PF) 100 MCG/2ML IJ SOLN
INTRAMUSCULAR | Status: AC
  Filled 2020-04-10: qty 2

## 2020-04-10 MED ORDER — CARVEDILOL 3.125 MG PO TABS: 3.1250 mg | ORAL_TABLET | Freq: Two times a day (BID) | ORAL | 0 refills | Status: DC

## 2020-04-10 MED ORDER — INSULIN ASPART 100 UNIT/ML ~~LOC~~ SOLN: 5.0000 [IU] | Freq: Three times a day (TID) | SUBCUTANEOUS | Status: DC

## 2020-04-10 MED ORDER — MIDAZOLAM HCL 5 MG/5ML IJ SOLN
INTRAMUSCULAR | Status: DC | PRN
  Administered 2020-04-10: 2 mg via INTRAVENOUS
  Administered 2020-04-10 (×2): 1 mg via INTRAVENOUS
  Administered 2020-04-10: 2 mg via INTRAVENOUS
  Administered 2020-04-10: 1 mg via INTRAVENOUS

## 2020-04-10 MED ORDER — INSULIN GLARGINE 100 UNIT/ML ~~LOC~~ SOLN: 10.0000 [IU] | Freq: Every day | SUBCUTANEOUS | 11 refills | Status: DC

## 2020-04-10 MED ORDER — FENTANYL CITRATE (PF) 100 MCG/2ML IJ SOLN
INTRAMUSCULAR | Status: DC | PRN
  Administered 2020-04-10 (×3): 25 ug via INTRAVENOUS

## 2020-04-10 MED ORDER — DEXTROSE 50 % IV SOLN
25.0000 g | Freq: Once | INTRAVENOUS | Status: AC
  Administered 2020-04-10: 25 g via INTRAVENOUS

## 2020-04-10 MED ORDER — STERILE WATER FOR IRRIGATION IR SOLN: Status: DC | PRN

## 2020-04-10 MED ORDER — MEPERIDINE HCL 50 MG/ML IJ SOLN
INTRAMUSCULAR | Status: AC
  Filled 2020-04-10: qty 1

## 2020-04-10 MED ORDER — LIDOCAINE VISCOUS HCL 2 % MT SOLN
OROMUCOSAL | Status: AC
  Filled 2020-04-10: qty 15

## 2020-04-10 MED ORDER — MIDAZOLAM HCL 5 MG/5ML IJ SOLN
INTRAMUSCULAR | Status: AC
  Filled 2020-04-10: qty 10

## 2020-04-10 MED ORDER — DEXTROSE 50 % IV SOLN
INTRAVENOUS | Status: AC
  Filled 2020-04-10: qty 50

## 2020-04-10 MED ORDER — ONDANSETRON HCL 4 MG/2ML IJ SOLN
INTRAMUSCULAR | Status: AC
  Filled 2020-04-10: qty 2

## 2020-04-10 MED ORDER — DEXTROSE 10 % IV SOLN: INTRAVENOUS | Status: DC

## 2020-04-10 NOTE — Interval H&P Note (Signed)
History and Physical Interval Note:  04/10/2020 9:55 AM  Omar Parker  has presented today for surgery, with the diagnosis of Rectal bleeding.  The various methods of treatment have been discussed with the patient and family. After consideration of risks, benefits and other options for treatment, the patient has consented to  Procedure(s): COLONOSCOPY (N/A) as a surgical intervention.  The patient's history has been reviewed, patient examined, no change in status, stable for surgery.  I have reviewed the patient's chart and labs.  Questions were answered to the patient's satisfaction.    The risks, benefits, limitations, imponderables and alternatives regarding both EGD and colonoscopy have been reviewed with the patient. Questions have been answered. All parties agreeable.

## 2020-04-10 NOTE — Discharge Instructions (Signed)
Gastrointestinal Bleeding Gastrointestinal (GI) bleeding is bleeding somewhere along the digestive tract, between the mouth and the anus. This tract includes the mouth, esophagus, stomach, small intestine, large intestine, and anus. The large intestine is often called the colon. GI bleeding can be caused by various problems. The severity of these problems can range from mild to serious or even life-threatening. If you have GI bleeding, you may find blood in your stools (feces), you may have black stools, or you may vomit blood. If there is a lot of bleeding, you may need to stay in the hospital. What are the causes? This condition may be caused by:  Inflammation, irritation, or swelling of the esophagus (esophagitis). The esophagus is part of the body that moves food from your mouth to your stomach.  Swollen veins in the rectum (hemorrhoids).  Areas of painful tearing in the anus that are often caused by passing hard stool (anal fissures).  Pouches that form on the colon over time, with age, and may bleed a lot (diverticulosis).  Inflammation (diverticulitis) in areas with diverticulosis. This can cause pain, fever, and bloody stools, although bleeding may be mild.  Growths (polyps) or cancer. Colon cancer often starts out as precancerous polyps.  Gastritis and ulcers. With these, bleeding may come from the upper GI tract, near the stomach. What increases the risk? You are more likely to develop this condition if you:  Have an infection in your stomach from a type of bacteria called Helicobacter pylori.  Take certain medicines, such as: ? NSAIDs. ? Aspirin. ? Selective serotonin reuptake inhibitors (SSRIs). ? Steroids. ? Antiplatelet or anticoagulant medicines.  Smoke.  Drink alcohol. What are the signs or symptoms? Common symptoms of this condition include:  Bright red blood in your vomit, or vomit that looks like coffee grounds.  Bloody, black, or tarry stools. ? Bleeding  from the lower GI tract will usually cause red or maroon blood in the stools. ? Bleeding from the upper GI tract may cause black, tarry stools that are often stronger smelling than usual. ? In certain cases, if the bleeding is fast enough, the stools may be red.  Pain or cramping in the abdomen. How is this diagnosed? This condition may be diagnosed based on:  Your medical history and a physical exam.  Various tests, such as: ? Blood tests. ? Stool tests. ? X-rays and other imaging tests. ? Esophagogastroduodenoscopy (EGD). In this test, a flexible, lighted tube is used to look at your esophagus, stomach, and small intestine. ? Colonoscopy. In this test, a flexible, lighted tube is used to look at your colon. How is this treated? Treatment for this condition depends on the cause of the bleeding. For example:  For bleeding from the esophagus, stomach, small intestine, or colon, the health care provider doing your EGD or colonoscopy may be able to stop the bleeding as part of the procedure.  Inflammation or infection of the colon can be treated with medicines.  Certain rectal problems can be treated with creams, suppositories, or warm baths.  Medicines may be given to reduce acid in your stomach.  Surgery is sometimes needed.  Blood transfusions are sometimes needed if a lot of blood has been lost. If bleeding is mild, you may be allowed to go home. If there is a lot of bleeding, you will need to stay in the hospital for observation. Follow these instructions at home:   Take over-the-counter and prescription medicines only as told by your health care provider.    Eat foods that are high in fiber, such as beans, whole grains, and fresh fruits and vegetables. This will help to keep your stools soft. Eating 1-3 prunes each day works well for many people.  Drink enough fluid to keep your urine pale yellow.  Keep all follow-up visits as told by your health care provider. This is  important. Contact a health care provider if:  Your symptoms do not improve. Get help right away if:  Your bleeding does not stop.  You feel light-headed or you faint.  You feel weak.  You have severe cramps in your back or abdomen.  You pass large blood clots in your stool.  Your symptoms are getting worse.  You have chest pain or fast heartbeats. Summary  Gastrointestinal (GI) bleeding is bleeding somewhere along the digestive tract, between the mouth and anus. GI bleeding can be caused by various problems. The severity of these problems can range from mild to serious or even life-threatening.  Treatment for this condition depends on the cause of the bleeding.  Take over-the-counter and prescription medicines only as told by your health care provider.  Keep all follow-up visits as told by your health care provider. This is important.  Get help right away if your bleeding increases, your symptoms are getting worse, or you have new symptoms. This information is not intended to replace advice given to you by your health care provider. Make sure you discuss any questions you have with your health care provider. Document Revised: 05/29/2018 Document Reviewed: 05/29/2018 Elsevier Patient Education  2020 [place].  IMPORTANT INFORMATION: PAY CLOSE ATTENTION   PHYSICIAN DISCHARGE INSTRUCTIONS  Follow with Primary care provider  Redmond School, MD  and other consultants as instructed by your Hospitalist Physician  [place] IF SYMPTOMS COME BACK, WORSEN OR NEW PROBLEM DEVELOPS   Please note: You were cared for by a hospitalist during your hospital stay. Every effort will be made to forward records to your primary care provider.  You can request that your primary care provider send for your hospital records if they have not received them.  Once you are discharged, your primary care physician will handle any further medical issues. Please  note that NO REFILLS for any discharge medications will be authorized once you are discharged, as it is imperative that you return to your primary care physician (or establish a relationship with a primary care physician if you do not have one) for your post hospital discharge needs so that they can reassess your need for medications and monitor your lab values.  Please get a complete blood count and chemistry panel checked by your Primary MD at your next visit, and again as instructed by your Primary MD.  Get Medicines reviewed and adjusted: Please take all your medications with you for your next visit with your Primary MD  Laboratory/radiological data: Please request your Primary MD to go over all hospital tests and procedure/radiological results at the follow up, please ask your primary care provider to get all Hospital records sent to his/her office.  In some cases, they will be blood work, cultures and biopsy results pending at the time of your discharge. Please request that your primary care provider follow up on these results.  If you are diabetic, please bring your blood sugar readings with you to your follow up appointment with primary care.    Please call and make your follow up appointments as soon as possible.  Also Note the following: If you experience worsening of your admission symptoms, develop shortness of breath, life threatening emergency, suicidal or homicidal thoughts you must seek medical attention immediately by calling 911 or calling your MD immediately  if symptoms less severe.  You must read complete instructions/literature along with all the possible adverse reactions/side effects for all the Medicines you take and that have been prescribed to you. Take any new Medicines after you have completely understood and accpet all the possible adverse reactions/side effects.   Do not drive when taking Pain medications or sleeping medications (Benzodiazepines)  Do not take  more than prescribed Pain, Sleep and Anxiety Medications. It is not advisable to combine anxiety,sleep and pain medications without talking with your primary care practitioner  Special Instructions: If you have smoked or chewed Tobacco  in the last 2 yrs please stop smoking, stop any regular Alcohol  and or any Recreational drug use.  Wear Seat belts while driving.  Do not drive if taking any narcotic, mind altering or controlled substances or recreational drugs or alcohol.

## 2020-04-10 NOTE — Progress Notes (Signed)
Patient's blood sugar 56 at this time. Dr. Olevia Bowens notified. New orders placed.

## 2020-04-10 NOTE — Progress Notes (Signed)
[facility] night shift.  The staff reported that the patient's glucose level was 56 mg/dL on a recent CBG.  He is currently n.p.o. for procedural purposes.  Dextrose 50% 25 g IVP x1 and dextrose 10% infusion on 50 mL/hr ordered.  Tennis Must, MD

## 2020-04-10 NOTE — Op Note (Signed)
[facility] Patient Name: Omar Parker Procedure Date: 04/10/2020 10:41 AM MRN: [id number] Date of Birth: 03-09-1946 Attending MD: [name] , MD CSN: [id number] Age: 74 Admit Type: Inpatient Procedure:                Upper GI endoscopy Indications:              Nausea, GI bleed Providers:                [name], MD, Janeece Riggers, RN, Lurline Del, RN Referring MD:              Medicines:                Fentanyl 75 micrograms IV, Midazolam 7 mg IV,                            Ondansetron 4 mg IV Complications:            No immediate complications. Estimated Blood Loss:     Estimated blood loss was minimal. Estimated blood                            loss was minimal. Procedure:                Pre-Anesthesia Assessment:                           - Prior to the procedure, a History and Physical                            was performed, and patient medications and                            allergies were reviewed. The patient's tolerance of                            previous anesthesia was also reviewed. The risks                            and benefits of the procedure and the sedation                            options and risks were discussed with the patient.                            All questions were answered, and informed consent                            was obtained. Prior Anticoagulants: The patient has                            taken no previous anticoagulant or antiplatelet  agents. ASA Grade Assessment: III - A patient with                            severe systemic disease. After reviewing the risks                            and benefits, the patient was deemed in                            satisfactory condition to undergo the procedure.                           After obtaining informed consent, the endoscope was                            passed under direct vision. Throughout the                             procedure, the patient's blood pressure, pulse, and                            oxygen saturations were monitored continuously. The                            [id number]-[id number] ([id number]) scope was introduced through the                            mouth, and advanced to the second part of duodenum. Scope In: 10:46:09 AM Scope Out: 10:52:16 AM Total Procedure Duration: 0 hours 6 minutes 7 seconds  Findings:      The examined esophagus was normal.      A small hiatal hernia was present.      Multiple localized 4 mm erosions with no stigmata of recent bleeding       were found in the gastric antrum. No ulcer or infiltrating process.      The duodenal bulb and second portion of the duodenum were normal. The       abnormal gastric mucosa was biopsied with a cold forceps for histology. Impression:               - Normal esophagus.                           - Small hiatal hernia.                           - Erosive gastropathy with no stigmata of recent                            bleeding -biopsied. Patient likely bled from his                            lower GI tract. Moderate Sedation:      Moderate (conscious) sedation was administered by the endoscopy nurse       and supervised by the endoscopist. The following parameters were  monitored: oxygen saturation, heart rate, blood pressure, respiratory       rate, EKG, adequacy of pulmonary ventilation, and response to care.       Total physician intraservice time was 48 minutes. Recommendation:           - Return patient to hospital ward for observation.                           - Advance diet as tolerated.                           - Continue present medications. See colonoscopy                            report. Patient advised to avoid aspirin and                            nonsteroidal agents due to dyspeptic symptoms and                            chronic kidney disease. From a GI standpoint, could                             be discharged within the next 24 hours                           - I will follow up on pathology. If he displays                            evidence of further bleeding, then additional GI                            evaluation would be warranted.                           -Follow-up with Dr. Laural Golden in the office as needed Procedure Code(s):        --- Professional ---                           [phone], Esophagogastroduodenoscopy, flexible,                            transoral; with biopsy, single or multiple                           99153, Moderate sedation; each additional 15                            minutes intraservice time                           99153, Moderate sedation; each additional 15                            minutes intraservice time  [id number], Moderate sedation services provided by the                            same physician or other qualified health care                            professional performing a gastrointestinal                            endoscopic service that sedation supports,                            requiring the presence of an independent trained                            observer to assist in the monitoring of the                            patient's level of consciousness and physiological                            status; initial 15 minutes of intra-service time;                            patient age 72 years or older (additional time may                            be reported with [phone], as appropriate) Diagnosis Code(s):        --- Professional ---                           K44.9, Diaphragmatic hernia without obstruction or                            gangrene                           K31.89, Other diseases of stomach and duodenum CPT copyright 2019 [employer]. All rights reserved. The codes documented in this report are preliminary and upon coder review may  be revised to meet current compliance  requirements. Cristopher Estimable. Keyara Ent, MD [name], MD 04/10/2020 11:04:46 AM This report has been signed electronically. Number of Addenda: 0

## 2020-04-10 NOTE — Op Note (Signed)
[facility] Patient Name: Omar Parker Procedure Date: 04/10/2020 9:06 AM MRN: [id number] Date of Birth: 1946-01-23 Attending MD: [name] , MD CSN: [id number] Age: 74 Admit Type: Inpatient Procedure:                Colonoscopy Indications:              Hematochezia Providers:                [name], MD, Janeece Riggers, RN, Lurline Del, RN Referring MD:              Medicines:                Midazolam 5 mg IV, Fentanyl 50 micrograms IV Complications:            No immediate complications. Estimated Blood Loss:     Estimated blood loss: minimal Procedure:                Pre-Anesthesia Assessment:                           - Prior to the procedure, a History and Physical                            was performed, and patient medications and                            allergies were reviewed. The patient's tolerance of                            previous anesthesia was also reviewed. The risks                            and benefits of the procedure and the sedation                            options and risks were discussed with the patient.                            All questions were answered, and informed consent                            was obtained. Prior Anticoagulants: The patient has                            taken no previous anticoagulant or antiplatelet                            agents. ASA Grade Assessment: III - A patient with                            severe systemic disease. After reviewing the risks  and benefits, the patient was deemed in                            satisfactory condition to undergo the procedure.                           After obtaining informed consent, the colonoscope                            was passed under direct vision. Throughout the                            procedure, the patient's blood pressure, pulse, and                            oxygen saturations were  monitored continuously. The                            CF-HQ190L ([id number] scope was introduced through                            the anus and advanced to the the cecum, identified                            by appendiceal orifice and ileocecal valve. The                            ileocecal valve, appendiceal orifice, and rectum                            were photographed. The ileocecal valve, appendiceal                            orifice, and rectum were photographed. The entire                            colon was well visualized. Scope In: 10:16:50 AM Scope Out: 10:34:34 AM Scope Withdrawal Time: 0 hours 12 minutes 35 seconds  Total Procedure Duration: 0 hours 17 minutes 44 seconds  Findings:      The perianal and digital rectal examinations were normal.      Scattered medium-mouthed diverticula were found in the sigmoid colon and       descending colon.      Three semi-pedunculated polyps were found in the cecum. The polyps were       4 to 8 mm in size. These polyps were removed with a cold snare.       Resection and retrieval were complete. Estimated blood loss was minimal.      The exam was otherwise without abnormality on direct and retroflexion       views. No blood seen in the lower GI tract. No suspect lesion seen as       well. Impression:               - Diverticulosis in the sigmoid colon and in the  descending colon.                           - Three 4 to 8 mm polyps in the cecum, removed with                            a cold snare. Resected and retrieved. I suppose he                            could have bled from a diverticulum. However, no                            stigmata whatsoever.                           - The examination was otherwise normal on direct                            and retroflexion views. Given his recent "nausea"                            in the setting of Aleve and Alka-Seltzer use,                             patient will undergo a subsequent EGD per plan. Moderate Sedation:      Moderate (conscious) sedation was administered by the endoscopy nurse       and supervised by the endoscopist. The following parameters were       monitored: oxygen saturation, heart rate, blood pressure, respiratory       rate, EKG, adequacy of pulmonary ventilation, and response to care.       Total physician intraservice time was 30 minutes. Recommendation:           - Return patient to hospital ward for ongoing care.                            See EGD report. Procedure Code(s):        --- Professional ---                           [phone], Colonoscopy, flexible; with removal of                            tumor(s), polyp(s), or other lesion(s) by snare                            technique                           99153, Moderate sedation; each additional 15                            minutes intraservice time                           G0500, Moderate sedation  services provided by the                            same physician or other qualified health care                            professional performing a gastrointestinal                            endoscopic service that sedation supports,                            requiring the presence of an independent trained                            observer to assist in the monitoring of the                            patient's level of consciousness and physiological                            status; initial 15 minutes of intra-service time;                            patient age 36 years or older (additional time may                            be reported with [phone], as appropriate) Diagnosis Code(s):        --- Professional ---                           K63.5, Polyp of colon                           K92.1, Melena (includes Hematochezia)                           K57.30, Diverticulosis of large intestine without                            perforation or abscess  without bleeding CPT copyright 2019 [employer]. All rights reserved. The codes documented in this report are preliminary and upon coder review may  be revised to meet current compliance requirements. Cristopher Estimable. Oluwatobi Visser, MD [name], MD 04/10/2020 10:41:05 AM This report has been signed electronically. Number of Addenda: 0

## 2020-04-10 NOTE — Discharge Summary (Signed)
Physician Discharge Summary  Omar Parker HYQ:[id number] DOB: [birth year] DOA: 04/09/2020  PCP: Redmond School, MD Nephrology: Dr. Theador Hawthorne  Admit date: 04/09/2020 Discharge date: 04/10/2020  Admitted From:  Home  Disposition:  Home   Recommendations for Outpatient Follow-up:  1. Follow up with PCP in 1 weeks 2. Follow up with nephrology in 2 weeks 3. Please obtain BMP/CBC in 1-2 weeks 4. Please follow up on the following pending results: biopsy results from endoscopy  Discharge Condition: STABLE   CODE STATUS: FULL    Brief Hospitalization Summary: Please see all hospital notes, images, labs for full details of the hospitalization. ADMISSION HPI: Omar Parker is a 74 y.o. male with stage IV CKD, insulin-dependent diabetes mellitus with renal complications, anemia of chronic kidney disease, diverticulosis from a colonoscopy done in 2018, hypertension poorly controlled, hyperlipidemia and reported iron deficiency anemia taking aspirin daily reported that he had 2 separate bowel movements this morning with dark red blood prompting him to come to the emergency department.  He does not have a history of rectal bleeding.  He has been seen by his nephrologist couple of days ago and started on spironolactone 25 mg daily and was supposed to be completing a 24-hour urine study.  There have been no other changes to his medical history.  He denies having shortness of breath and chest pain with the symptoms.  He denies fatigue fever and chills.  He denies diarrhea.  He denies chest pain.  Patient had been hospitalized approximately 1 month ago at [facility] for uncontrolled hypertension.  ED Course: He arrived afebrile with blood pressure 172/77 pulse ox 100% on room air and heart rate 53.  He was Hemoccult positive.  Sodium 137 potassium 3.7 glucose 102 creatinine 6.07 calcium 8.2 WBC 6.7, hemoglobin 8.0, platelet count 288.  He is being transfused 1 unit PRBC.  GI was consulted.  Admission was  requested.  The patient was admitted for evaluation of rectal bleeding.  He was transfused 1 unit PRBC and hemoglobin has improved to 8.9.  He underwent evaluation by GI.  He had a colonoscopy and upper endoscopy.  He was noted to have diverticulosis in the sigmoid and descending colon and 3 4 to 8 mm polyps in the cecum were removed with cold snare.  EGD with findings of a normal esophagus small hiatal hernia erosive gastropathy with no stigmata of recent bleeding which was biopsied.  He was advised to avoid aspirin and NSAIDs.  Of note, the patient was having episodes of severe hypoglycemia on insulin.  His insulin doses were reduced on admission and he still continued to have low blood glucose.  He was on a dextrose infusion for short period of time.  We have taken him off all of his insulin at this time.  He can restart very low doses of insulin which has been markedly reduced with precautions.  He says he has a glucose monitor and testing supplies at home.  He has a Glucagon Emergency Kit at home his wife confirms.  He was also noted to have significant bradycardia and his carvedilol and diltiazem was markedly reduced.  His blood pressure remained uncontrolled and his hydralazine was increased to 50 mg 3 times daily.  He was advised to follow-up with his PCP and his nephrologist in the next 1 to 2 weeks.  Discharge Diagnoses:  Principal Problem:   Lower GI bleeding Active Problems:   Dehydration   Hypertension   High cholesterol   Uncontrolled hypertension  CKD (chronic kidney disease), stage V (HCC)   Acute blood loss anemia   Anemia in chronic kidney disease (CKD)   Type 2 diabetes mellitus with renal complication (HCC)   Diverticulosis   Aspirin long-term use   Discharge Instructions:  Allergies as of 04/10/2020      Reactions   Ace Inhibitors Swelling      Medication List    STOP taking these medications   aspirin 325 MG tablet   insulin regular 100 units/mL  injection Commonly known as: NOVOLIN R     TAKE these medications   carvedilol 3.125 MG tablet Commonly known as: COREG Take 1 tablet (3.125 mg total) by mouth 2 (two) times daily with a meal. Start taking on: April 11, 2020 What changed:   medication strength  how much to take   Co Q-10 100 MG Caps Take 1 capsule by mouth daily.   diltiazem 180 MG 24 hr capsule Commonly known as: TIAZAC Take 1 capsule (180 mg total) by mouth daily. Start taking on: April 12, 2020 What changed:   medication strength  how much to take  These instructions start on April 12, 2020. If you are unsure what to do until then, ask your doctor or other care provider.   furosemide 20 MG tablet Commonly known as: LASIX Take 20 mg by mouth 2 (two) times daily as needed for fluid.   hydrALAZINE 50 MG tablet Commonly known as: APRESOLINE Take 1 tablet (50 mg total) by mouth every 8 (eight) hours. What changed:   medication strength  how much to take   insulin glargine 100 UNIT/ML injection Commonly known as: LANTUS Inject 0.1 mLs (10 Units total) into the skin at bedtime. Start taking on: April 12, 2020 What changed:   how much to take  These instructions start on April 12, 2020. If you are unsure what to do until then, ask your doctor or other care provider.   Magnesium Oxide 400 MG Caps Take 1 capsule (400 mg total) by mouth daily.   pantoprazole 40 MG tablet Commonly known as: PROTONIX Take 1 tablet by mouth daily.   potassium chloride 10 MEQ tablet Commonly known as: Klor-Con M10 Take 1 tablet (10 mEq total) by mouth daily.   pravastatin 80 MG tablet Commonly known as: PRAVACHOL Take 80 mg by mouth at bedtime.   spironolactone 25 MG tablet Commonly known as: ALDACTONE Take 1 tablet by mouth daily.       Follow-up Information    Redmond School, MD. Schedule an appointment as soon as possible for a visit in 1 week(s).   Specialty: Internal Medicine Contact  information: [address] [place] [address] [phone]        Liana Gerold, MD. Schedule an appointment as soon as possible for a visit in 2 week(s).   Specialty: Nephrology Contact information: [address] [address] [phone]        Rogene Houston, MD. Schedule an appointment as soon as possible for a visit in 1 month(s).   Specialty: Gastroenterology Contact information: [address], [address] [place] [address] [phone]              Allergies  Allergen Reactions  . Ace Inhibitors Swelling   Allergies as of 04/10/2020      Reactions   Ace Inhibitors Swelling      Medication List    STOP taking these medications   aspirin 325 MG tablet   insulin  regular 100 units/mL injection Commonly known as: NOVOLIN R     TAKE these medications   carvedilol 3.125 MG tablet Commonly known as: COREG Take 1 tablet (3.125 mg total) by mouth 2 (two) times daily with a meal. Start taking on: April 11, 2020 What changed:   medication strength  how much to take   Co Q-10 100 MG Caps Take 1 capsule by mouth daily.   diltiazem 180 MG 24 hr capsule Commonly known as: TIAZAC Take 1 capsule (180 mg total) by mouth daily. Start taking on: April 12, 2020 What changed:   medication strength  how much to take  These instructions start on April 12, 2020. If you are unsure what to do until then, ask your doctor or other care provider.   furosemide 20 MG tablet Commonly known as: LASIX Take 20 mg by mouth 2 (two) times daily as needed for fluid.   hydrALAZINE 50 MG tablet Commonly known as: APRESOLINE Take 1 tablet (50 mg total) by mouth every 8 (eight) hours. What changed:   medication strength  how much to take   insulin glargine 100 UNIT/ML injection Commonly known as: LANTUS Inject 0.1 mLs (10 Units total) into the skin at bedtime. Start taking on: April 12, 2020 What changed:   how much to  take  These instructions start on April 12, 2020. If you are unsure what to do until then, ask your doctor or other care provider.   Magnesium Oxide 400 MG Caps Take 1 capsule (400 mg total) by mouth daily.   pantoprazole 40 MG tablet Commonly known as: PROTONIX Take 1 tablet by mouth daily.   potassium chloride 10 MEQ tablet Commonly known as: Klor-Con M10 Take 1 tablet (10 mEq total) by mouth daily.   pravastatin 80 MG tablet Commonly known as: PRAVACHOL Take 80 mg by mouth at bedtime.   spironolactone 25 MG tablet Commonly known as: ALDACTONE Take 1 tablet by mouth daily.      Procedures/Studies:  No results found.   Subjective: Pt reports no further rectal bleeding.   Discharge Exam: Vitals:   04/10/20 1155 04/10/20 1300  BP: (!) 179/80 (!) 189/91  Pulse: (!) 53 63  Resp: 18 19  Temp: 98.4 F (36.9 C) 98.4 F (36.9 C)  SpO2: 100% 100%   Vitals:   04/10/20 1115 04/10/20 1120 04/10/20 1155 04/10/20 1300  BP: 140/65 (!) 148/69 (!) 179/80 (!) 189/91  Pulse: (!) 50 (!) 55 (!) 53 63  Resp: 17 (!) '24 18 19  '$ Temp:   98.4 F (36.9 C) 98.4 F (36.9 C)  TempSrc:   Axillary Oral  SpO2: 100% 96% 100% 100%  Weight:      Height:       General: Pt is alert, awake, not in acute distress Cardiovascular: RRR, S1/S2 +, no rubs, no gallops Respiratory: CTA bilaterally, no wheezing, no rhonchi Abdominal: Soft, NT, ND, bowel sounds + Extremities: no edema, no cyanosis   The results of significant diagnostics from this hospitalization (including imaging, microbiology, ancillary and laboratory) are listed below for reference.     Microbiology: Recent Results (from the past 240 hour(s))  SARS Coronavirus 2 by RT PCR (hospital order, performed in [facility] hospital lab) Nasopharyngeal Nasopharyngeal Swab     Status: None   Collection Time: 04/09/20  3:11 PM   Specimen: Nasopharyngeal Swab  Result Value Ref Range Status   SARS Coronavirus 2 NEGATIVE NEGATIVE Final     Comment: (NOTE) SARS-CoV-2 target nucleic  acids are NOT DETECTED.  The SARS-CoV-2 RNA is generally detectable in upper and lower respiratory specimens during the acute phase of infection. The lowest concentration of SARS-CoV-2 viral copies this assay can detect is 250 copies / mL. A negative result does not preclude SARS-CoV-2 infection and should not be used as the sole basis for treatment or other patient management decisions.  A negative result may occur with improper specimen collection / handling, submission of specimen other than nasopharyngeal swab, presence of viral mutation(s) within the areas targeted by this assay, and inadequate number of viral copies (<250 copies / mL). A negative result must be combined with clinical observations, patient history, and epidemiological information.  Fact Sheet for Patients:   StrictlyIdeas.no  Fact Sheet for Healthcare Providers: BankingDealers.co.za  This test is not yet approved or  cleared by the [place] FDA and has been authorized for detection and/or diagnosis of SARS-CoV-2 by FDA under an Emergency Use Authorization (EUA).  This EUA will remain in effect (meaning this test can be used) for the duration of the COVID-19 declaration under Section 564(b)(1) of the Act, 21 U.S.C. section 360bbb-3(b)(1), unless the authorization is terminated or revoked sooner.  Performed at [facility], [address] [place], [place] [address]      Labs: BNP (last 3 results) No results for input(s): BNP in the last 8760 hours. Basic Metabolic Panel: Recent Labs  Lab 04/09/20 1046 04/10/20 0637  NA 137 136  K 3.7 3.7  CL 104 105  CO2 21* 23  GLUCOSE 102* 146*  BUN 87* 76*  CREATININE 6.07* 5.37*  CALCIUM 8.2* 7.9*  MG  --  1.9  PHOS  --  4.9*   Liver Function Tests: Recent Labs  Lab 04/09/20 1046 04/10/20 0637  AST 11* 13*  ALT 12 10  ALKPHOS 46 43  BILITOT 0.3 0.4  PROT 7.6  7.2  ALBUMIN 3.2* 3.2*   No results for input(s): LIPASE, AMYLASE in the last 168 hours. No results for input(s): AMMONIA in the last 168 hours. CBC: Recent Labs  Lab 04/09/20 1046 04/09/20 1629 04/10/20 0637  WBC 6.7  --  7.7  NEUTROABS 4.6  --   --   HGB 8.0* 10.2* 8.9*  HCT 24.9* 31.1* 27.6*  MCV 79.3*  --  80.0  PLT 288  --  264   Cardiac Enzymes: No results for input(s): CKTOTAL, CKMB, CKMBINDEX, TROPONINI in the last 168 hours. BNP: Invalid input(s): POCBNP CBG: Recent Labs  Lab 04/10/20 1128 04/10/20 1141 04/10/20 1210 04/10/20 1257 04/10/20 1638  GLUCAP 52* 49* 61* 88 187*   D-Dimer No results for input(s): DDIMER in the last 72 hours. Hgb A1c No results for input(s): HGBA1C in the last 72 hours. Lipid Profile No results for input(s): CHOL, HDL, LDLCALC, TRIG, CHOLHDL, LDLDIRECT in the last 72 hours. Thyroid function studies Recent Labs    04/09/20 1629  TSH 2.661   Anemia work up Recent Labs    04/09/20 1629  VITAMINB12 548  FOLATE 14.6  FERRITIN 88  TIBC 265  IRON 64  RETICCTPCT 0.8   Urinalysis    Component Value Date/Time   COLORURINE YELLOW 01/03/2020 1846   APPEARANCEUR CLEAR 01/03/2020 1846   LABSPEC 1.017 01/03/2020 1846   PHURINE 6.0 01/03/2020 1846   GLUCOSEU 50 (A) 01/03/2020 1846   HGBUR NEGATIVE 01/03/2020 1846   BILIRUBINUR NEGATIVE 01/03/2020 1846   KETONESUR NEGATIVE 01/03/2020 1846   PROTEINUR >=300 (A) 01/03/2020 1846   NITRITE NEGATIVE 01/03/2020  [place] 01/03/2020 1846   Sepsis Labs Invalid input(s): PROCALCITONIN,  WBC,  LACTICIDVEN Microbiology Recent Results (from the past 240 hour(s))  SARS Coronavirus 2 by RT PCR (hospital order, performed in [facility] hospital lab) Nasopharyngeal Nasopharyngeal Swab     Status: None   Collection Time: 04/09/20  3:11 PM   Specimen: Nasopharyngeal Swab  Result Value Ref Range Status   SARS Coronavirus 2 NEGATIVE NEGATIVE Final    Comment:  (NOTE) SARS-CoV-2 target nucleic acids are NOT DETECTED.  The SARS-CoV-2 RNA is generally detectable in upper and lower respiratory specimens during the acute phase of infection. The lowest concentration of SARS-CoV-2 viral copies this assay can detect is 250 copies / mL. A negative result does not preclude SARS-CoV-2 infection and should not be used as the sole basis for treatment or other patient management decisions.  A negative result may occur with improper specimen collection / handling, submission of specimen other than nasopharyngeal swab, presence of viral mutation(s) within the areas targeted by this assay, and inadequate number of viral copies (<250 copies / mL). A negative result must be combined with clinical observations, patient history, and epidemiological information.  Fact Sheet for Patients:   StrictlyIdeas.no  Fact Sheet for Healthcare Providers: BankingDealers.co.za  This test is not yet approved or  cleared by the [place] FDA and has been authorized for detection and/or diagnosis of SARS-CoV-2 by FDA under an Emergency Use Authorization (EUA).  This EUA will remain in effect (meaning this test can be used) for the duration of the COVID-19 declaration under Section 564(b)(1) of the Act, 21 U.S.C. section 360bbb-3(b)(1), unless the authorization is terminated or revoked sooner.  Performed at [facility], [address] [place], [place] [address]    Time coordinating discharge:   SIGNED:   Irwin Brakeman, MD  [facility] 04/10/2020, 5:02 PM How to contact the [facility] Attending or Consulting provider [place] or covering provider during after hours [place], for this patient?  1. Check the care team in [facility] and look for a) attending/consulting [facility] provider listed and b) the [facility] team listed 2. Log into [email or web address] and use [place]'s universal password to access. If you do not have the password, please  contact the hospital operator. 3. Locate the [facility] provider you are looking for under [facility] and page to a number that you can be directly reached. 4. If you still have difficulty reaching the provider, please page the [facility] (Director on Call) for the Hospitalists listed on amion for assistance.

## 2020-04-12 ENCOUNTER — Encounter (HOSPITAL_COMMUNITY): Payer: Self-pay | Admitting: Internal Medicine

## 2020-04-12 LAB — BPAM RBC
Blood Product Expiration Date: 202107032359
ISSUE DATE / TIME: 202106111324
Unit Type and Rh: 6200

## 2020-04-12 LAB — TYPE AND SCREEN
ABO/RH(D): A POS
Antibody Screen: NEGATIVE
Unit division: 0

## 2020-04-12 LAB — GLUCOSE, CAPILLARY: Glucose-Capillary: 90 mg/dL (ref 70–99)

## 2020-04-13 ENCOUNTER — Other Ambulatory Visit: Payer: Self-pay

## 2020-04-13 LAB — SURGICAL PATHOLOGY

## 2020-04-14 ENCOUNTER — Encounter: Payer: Self-pay | Admitting: Internal Medicine

## 2020-04-29 ENCOUNTER — Other Ambulatory Visit (HOSPITAL_COMMUNITY): Payer: Self-pay | Admitting: Nephrology

## 2020-04-29 ENCOUNTER — Other Ambulatory Visit: Payer: Self-pay | Admitting: Nephrology

## 2020-04-29 DIAGNOSIS — N170 Acute kidney failure with tubular necrosis: Secondary | ICD-10-CM

## 2020-05-06 ENCOUNTER — Other Ambulatory Visit: Payer: Self-pay

## 2020-05-06 ENCOUNTER — Ambulatory Visit (HOSPITAL_COMMUNITY)
Admission: RE | Admit: 2020-05-06 | Discharge: 2020-05-06 | Disposition: A | Discharge status: Home / Self Care | Payer: BC Managed Care – PPO | Source: Ambulatory Visit | Attending: Nephrology | Admitting: Nephrology

## 2020-05-06 DIAGNOSIS — N170 Acute kidney failure with tubular necrosis: Secondary | ICD-10-CM | POA: Diagnosis present

## 2020-05-12 ENCOUNTER — Encounter (HOSPITAL_COMMUNITY): Payer: Self-pay

## 2020-05-12 ENCOUNTER — Other Ambulatory Visit: Payer: Self-pay

## 2020-05-12 ENCOUNTER — Encounter (HOSPITAL_COMMUNITY)
Admission: RE | Admit: 2020-05-12 | Discharge: 2020-05-12 | Disposition: A | Discharge status: Home / Self Care | Payer: BC Managed Care – PPO | Source: Ambulatory Visit | Attending: Nephrology | Admitting: Nephrology

## 2020-05-12 ENCOUNTER — Other Ambulatory Visit (HOSPITAL_COMMUNITY)
Admission: RE | Admit: 2020-05-12 | Discharge: 2020-05-12 | Disposition: A | Discharge status: Home / Self Care | Payer: BC Managed Care – PPO | Source: Ambulatory Visit | Attending: Nephrology | Admitting: Nephrology

## 2020-05-12 DIAGNOSIS — N185 Chronic kidney disease, stage 5: Secondary | ICD-10-CM | POA: Insufficient documentation

## 2020-05-12 DIAGNOSIS — D631 Anemia in chronic kidney disease: Secondary | ICD-10-CM | POA: Insufficient documentation

## 2020-05-12 HISTORY — DX: Gastro-esophageal reflux disease without esophagitis: K21.9

## 2020-05-12 MED ORDER — EPOETIN ALFA 3000 UNIT/ML IJ SOLN
INTRAMUSCULAR | Status: AC
  Filled 2020-05-12: qty 1

## 2020-05-12 MED ORDER — EPOETIN ALFA 3000 UNIT/ML IJ SOLN
3000.0000 [IU] | Freq: Once | INTRAMUSCULAR | Status: AC
  Administered 2020-05-12: 3000 [IU] via SUBCUTANEOUS

## 2020-05-14 LAB — POCT HEMOGLOBIN-HEMACUE: Hemoglobin: 7.8 g/dL — ABNORMAL LOW (ref 13.0–17.0)

## 2020-05-26 ENCOUNTER — Encounter (HOSPITAL_COMMUNITY)
Admission: RE | Admit: 2020-05-26 | Discharge: 2020-05-26 | Disposition: A | Discharge status: Home / Self Care | Payer: BC Managed Care – PPO | Source: Ambulatory Visit | Attending: Nephrology | Admitting: Nephrology

## 2020-05-26 ENCOUNTER — Other Ambulatory Visit: Payer: Self-pay

## 2020-05-26 ENCOUNTER — Encounter (HOSPITAL_COMMUNITY): Payer: Self-pay

## 2020-05-26 DIAGNOSIS — N185 Chronic kidney disease, stage 5: Secondary | ICD-10-CM | POA: Diagnosis not present

## 2020-05-26 LAB — POCT HEMOGLOBIN-HEMACUE: Hemoglobin: 7.7 g/dL — ABNORMAL LOW (ref 13.0–17.0)

## 2020-05-26 MED ORDER — EPOETIN ALFA 3000 UNIT/ML IJ SOLN
3000.0000 [IU] | Freq: Once | INTRAMUSCULAR | Status: AC
  Administered 2020-05-26: 3000 [IU] via SUBCUTANEOUS
  Filled 2020-05-26: qty 1

## 2020-06-02 ENCOUNTER — Other Ambulatory Visit: Payer: Self-pay

## 2020-06-02 DIAGNOSIS — N185 Chronic kidney disease, stage 5: Secondary | ICD-10-CM

## 2020-06-09 ENCOUNTER — Encounter (HOSPITAL_COMMUNITY): Payer: Self-pay

## 2020-06-09 ENCOUNTER — Encounter (HOSPITAL_COMMUNITY)
Admission: RE | Admit: 2020-06-09 | Discharge: 2020-06-09 | Disposition: A | Discharge status: Home / Self Care | Payer: BC Managed Care – PPO | Source: Ambulatory Visit | Attending: Nephrology | Admitting: Nephrology

## 2020-06-09 ENCOUNTER — Other Ambulatory Visit: Payer: Self-pay

## 2020-06-09 DIAGNOSIS — N184 Chronic kidney disease, stage 4 (severe): Secondary | ICD-10-CM | POA: Diagnosis not present

## 2020-06-09 DIAGNOSIS — D631 Anemia in chronic kidney disease: Secondary | ICD-10-CM | POA: Insufficient documentation

## 2020-06-09 LAB — POCT HEMOGLOBIN-HEMACUE: Hemoglobin: 8.6 g/dL — ABNORMAL LOW (ref 13.0–17.0)

## 2020-06-09 MED ORDER — EPOETIN ALFA 3000 UNIT/ML IJ SOLN
3000.0000 [IU] | Freq: Once | INTRAMUSCULAR | Status: AC
  Administered 2020-06-09: 3000 [IU] via SUBCUTANEOUS
  Filled 2020-06-09: qty 1

## 2020-06-17 ENCOUNTER — Encounter: Payer: Self-pay | Admitting: Vascular Surgery

## 2020-06-17 ENCOUNTER — Ambulatory Visit (HOSPITAL_COMMUNITY)
Admission: RE | Admit: 2020-06-17 | Discharge: 2020-06-17 | Disposition: A | Discharge status: Home / Self Care | Payer: BC Managed Care – PPO | Source: Ambulatory Visit | Attending: Vascular Surgery | Admitting: Vascular Surgery

## 2020-06-17 ENCOUNTER — Ambulatory Visit (INDEPENDENT_AMBULATORY_CARE_PROVIDER_SITE_OTHER)
Admission: RE | Admit: 2020-06-17 | Discharge: 2020-06-17 | Disposition: A | Discharge status: Home / Self Care | Payer: BC Managed Care – PPO | Source: Ambulatory Visit | Attending: Vascular Surgery | Admitting: Vascular Surgery

## 2020-06-17 ENCOUNTER — Other Ambulatory Visit: Payer: Self-pay

## 2020-06-17 ENCOUNTER — Ambulatory Visit (INDEPENDENT_AMBULATORY_CARE_PROVIDER_SITE_OTHER): Payer: BC Managed Care – PPO | Admitting: Vascular Surgery

## 2020-06-17 VITALS — BP 237/94 | HR 65 | Temp 98.0°F | Resp 20 | Ht 71.0 in | Wt 181.3 lb

## 2020-06-17 DIAGNOSIS — N185 Chronic kidney disease, stage 5: Secondary | ICD-10-CM

## 2020-06-17 NOTE — H&P (View-Only) (Signed)
Referring Physician: Dr. Theador Hawthorne  Patient name: Omar Parker MRN: [id number] DOB: 09/19/1946 Sex: male  REASON FOR CONSULT: Hemodialysis access  HPI: Omar Parker is a 74 y.o. male, who is CKD 5.  He is referred for hemodialysis access placement.  His renal failure is thought to be secondary to hypertension diabetes.  Patient is right-handed.  Other medical problems include anemia, reflux, elevated cholesterol all of which have been stable.  Past Medical History:  Diagnosis Date  . Anemia    IDA  . Diabetes mellitus without complication ([place])   . GERD (gastroesophageal reflux disease)   . High cholesterol   . Hypertension   . Stage 4 chronic kidney disease [facility])    Past Surgical History:  Procedure Laterality Date  . COLONOSCOPY N/A 05/16/2017   Procedure: COLONOSCOPY;  Surgeon: Rogene Houston, MD;  [facility];  Service: Endoscopy;  Laterality: N/A;  7:30  . COLONOSCOPY N/A 04/10/2020   Procedure: COLONOSCOPY;  Surgeon: Daneil Dolin, MD;  Location: [facility];  Service: Endoscopy;  Laterality: N/A;  . ESOPHAGOGASTRODUODENOSCOPY N/A 04/10/2020   Procedure: ESOPHAGOGASTRODUODENOSCOPY (EGD);  Surgeon: Daneil Dolin, MD;  Location: [facility];  Service: Endoscopy;  Laterality: N/A;  . HERNIA REPAIR    . POLYPECTOMY  04/10/2020   Procedure: POLYPECTOMY;  Surgeon: Daneil Dolin, MD;  Location: [facility];  Service: Endoscopy;;  cecal;    Family History  Problem Relation Age of Onset  . Chronic Renal Failure Neg Hx     SOCIAL HISTORY: Social History   Socioeconomic History  . Marital status: Married    Spouse name: Not on file  . Number of children: Not on file  . Years of education: Not on file  . Highest education level: Not on file  Occupational History  . Occupation: distribution work  Tobacco Use  . Smoking status: Never Smoker  . Smokeless tobacco: Never Used  Vaping Use  . Vaping Use: Never used  Substance and Sexual Activity  .  Alcohol use: Not Currently    Comment: no h/o heavy use  . Drug use: Not Currently    Types: Marijuana    Comment: none in 20 years  . Sexual activity: Not on file  Other Topics Concern  . Not on file  Social History Narrative  . Not on file   Social Determinants of Health   Financial Resource Strain:   . Difficulty of Paying Living Expenses: Not on file  Food Insecurity:   . Worried About Charity fundraiser in the Last Year: Not on file  . Ran Out of Food in the Last Year: Not on file  Transportation Needs:   . Lack of Transportation (Medical): Not on file  . Lack of Transportation (Non-Medical): Not on file  Physical Activity:   . Days of Exercise per Week: Not on file  . Minutes of Exercise per Session: Not on file  Stress:   . Feeling of Stress : Not on file  Social Connections:   . Frequency of Communication with Friends and Family: Not on file  . Frequency of Social Gatherings with Friends and Family: Not on file  . Attends Religious Services: Not on file  . Active Member of Clubs or Organizations: Not on file  . Attends Archivist Meetings: Not on file  . Marital Status: Not on file  Intimate Partner Violence:   . Fear of Current or Ex-Partner: Not on file  .  Emotionally Abused: Not on file  . Physically Abused: Not on file  . Sexually Abused: Not on file    Allergies  Allergen Reactions  . Ace Inhibitors Swelling    Current Outpatient Medications  Medication Sig Dispense Refill  . acetaminophen (TYLENOL) 325 MG tablet Take by mouth.    . calcitRIOL (ROCALTROL) 0.25 MCG capsule Take 0.25 mcg by mouth 3 (three) times a week.    . Coenzyme Q10 (CO Q-10) 100 MG CAPS Take 1 capsule by mouth daily.    Marland Kitchen diltiazem (CARDIZEM CD) 240 MG 24 hr capsule Take 240 mg by mouth daily.    Marland Kitchen epoetin alfa (EPOGEN) 3000 UNIT/ML injection Inject 3,000 Units into the vein every 14 (fourteen) days.    . furosemide (LASIX) 20 MG tablet Take 20 mg by mouth 2 (two) times  daily as needed for fluid.     Marland Kitchen insulin glargine (LANTUS) 100 UNIT/ML injection Inject 0.1 mLs (10 Units total) into the skin at bedtime. 10 mL 11  . Magnesium Oxide 400 MG CAPS Take 1 capsule (400 mg total) by mouth daily. 30 capsule 0  . pantoprazole (PROTONIX) 40 MG tablet Take 1 tablet by mouth daily.    . potassium chloride (KLOR-CON M10) 10 MEQ tablet Take 1 tablet (10 mEq total) by mouth daily. 30 tablet 0  . pravastatin (PRAVACHOL) 80 MG tablet Take 80 mg by mouth at bedtime.     Marland Kitchen spironolactone (ALDACTONE) 25 MG tablet Take 1 tablet by mouth daily.    . carvedilol (COREG) 3.125 MG tablet Take 1 tablet (3.125 mg total) by mouth 2 (two) times daily with a meal. 60 tablet 0  . hydrALAZINE (APRESOLINE) 50 MG tablet Take 1 tablet (50 mg total) by mouth every 8 (eight) hours. 90 tablet 0   No current facility-administered medications for this visit.    ROS:   General:  No weight loss, Fever, chills  HEENT: No recent headaches, no nasal bleeding, no visual changes, no sore throat  Neurologic: No dizziness, blackouts, seizures. No recent symptoms of stroke or mini- stroke. No recent episodes of slurred speech, or temporary blindness.  Cardiac: No recent episodes of chest pain/pressure, no shortness of breath at rest.  No shortness of breath with exertion.  Denies history of atrial fibrillation or irregular heartbeat  Vascular: No history of rest pain in feet.  No history of claudication.  No history of non-healing ulcer, No history of DVT   Pulmonary: No home oxygen, no productive cough, no hemoptysis,  No asthma or wheezing  Musculoskeletal:  [ ]  Arthritis, [ ]  Low back pain,  [ ]  Joint pain  Hematologic:No history of hypercoagulable state.  No history of easy bleeding.  No history of anemia  Gastrointestinal: No hematochezia or melena,  No gastroesophageal reflux, no trouble swallowing  Urinary: [X]  chronic Kidney disease, [ ]  on HD - [ ]  MWF or [ ]  TTHS, [ ]  Burning with  urination, [ ]  Frequent urination, [ ]  Difficulty urinating;   Skin: No rashes  Psychological: No history of anxiety,  No history of depression   Physical Examination  Vitals:   06/17/20 0955 06/17/20 0957  BP: (!) 234/155 (!) 237/94  Pulse: 65   Resp: 20   Temp: 98 F (36.7 C)   SpO2: 97%   Weight: 181 lb 4.8 oz (82.2 kg)   Height: 5\' 11"  (1.803 m)     Body mass index is 25.29 kg/m.  General:  Alert and oriented, no  acute distress HEENT: Normal Neck: No JVD Cardiac: Regular Rate and Rhythm Skin: No rash Extremity Pulses:  2+ radial, brachial pulses bilaterally Musculoskeletal: No deformity or edema  Neurologic: Upper and lower extremity motor 5/5 and symmetric  DATA:  Patient had a vein mapping ultrasound today as well as an upper extremity arterial duplex exam.  The upper extremity arterial duplex showed normal anatomy.  There was no significant obstruction.  Vein mapping showed no suitable veins for creation of a fistula.  I reviewed interpreted both the studies.  ASSESSMENT: Patient with no suitable vein for creation of a fistula.  He is CKD 5.  He is right-handed.   PLAN: Left upper arm AV graft scheduled for Monday, June 28, 2020.  Risk benefits possible complications of procedure details including but not limited to bleeding infection graft thrombosis ischemic steal were all discussed with the patient today.  He understands and agrees to proceed.   Ruta Hinds, MD [facility] Office: [phone]

## 2020-06-17 NOTE — Progress Notes (Signed)
Referring Physician: Dr. Theador Hawthorne  Patient name: Omar Parker MRN: [id number] DOB: 13-Jan-1946 Sex: male  REASON FOR CONSULT: Hemodialysis access  HPI: Omar Parker is a 74 y.o. male, who is CKD 5.  He is referred for hemodialysis access placement.  His renal failure is thought to be secondary to hypertension diabetes.  Patient is right-handed.  Other medical problems include anemia, reflux, elevated cholesterol all of which have been stable.  Past Medical History:  Diagnosis Date  . Anemia    IDA  . Diabetes mellitus without complication ([place])   . GERD (gastroesophageal reflux disease)   . High cholesterol   . Hypertension   . Stage 4 chronic kidney disease [facility])    Past Surgical History:  Procedure Laterality Date  . COLONOSCOPY N/A 05/16/2017   Procedure: COLONOSCOPY;  Surgeon: Rogene Houston, MD;  [facility];  Service: Endoscopy;  Laterality: N/A;  7:30  . COLONOSCOPY N/A 04/10/2020   Procedure: COLONOSCOPY;  Surgeon: Daneil Dolin, MD;  [facility];  Service: Endoscopy;  Laterality: N/A;  . ESOPHAGOGASTRODUODENOSCOPY N/A 04/10/2020   Procedure: ESOPHAGOGASTRODUODENOSCOPY (EGD);  Surgeon: Daneil Dolin, MD;  Location[facility];  Service: Endoscopy;  Laterality: N/A;  . HERNIA REPAIR    . POLYPECTOMY  04/10/2020   Procedure: POLYPECTOMY;  Surgeon: Daneil Dolin, MD;  Location[facility];  Service: Endoscopy;;  cecal;    Family History  Problem Relation Age of Onset  . Chronic Renal Failure Neg Hx     SOCIAL HISTORY: Social History   Socioeconomic History  . Marital status: Married    Spouse name: Not on file  . Number of children: Not on file  . Years of education: Not on file  . Highest education level: Not on file  Occupational History  . Occupation: distribution work  Tobacco Use  . Smoking status: Never Smoker  . Smokeless tobacco: Never Used  Vaping Use  . Vaping Use: Never used  Substance and Sexual Activity  .  Alcohol use: Not Currently    Comment: no h/o heavy use  . Drug use: Not Currently    Types: Marijuana    Comment: none in 20 years  . Sexual activity: Not on file  Other Topics Concern  . Not on file  Social History Narrative  . Not on file   Social Determinants of Health   Financial Resource Strain:   . Difficulty of Paying Living Expenses: Not on file  Food Insecurity:   . Worried About Charity fundraiser in the Last Year: Not on file  . Ran Out of Food in the Last Year: Not on file  Transportation Needs:   . Lack of Transportation (Medical): Not on file  . Lack of Transportation (Non-Medical): Not on file  Physical Activity:   . Days of Exercise per Week: Not on file  . Minutes of Exercise per Session: Not on file  Stress:   . Feeling of Stress : Not on file  Social Connections:   . Frequency of Communication with Friends and Family: Not on file  . Frequency of Social Gatherings with Friends and Family: Not on file  . Attends Religious Services: Not on file  . Active Member of Clubs or Organizations: Not on file  . Attends Archivist Meetings: Not on file  . Marital Status: Not on file  Intimate Partner Violence:   . Fear of Current or Ex-Partner: Not on file  .  Emotionally Abused: Not on file  . Physically Abused: Not on file  . Sexually Abused: Not on file    Allergies  Allergen Reactions  . Ace Inhibitors Swelling    Current Outpatient Medications  Medication Sig Dispense Refill  . acetaminophen (TYLENOL) 325 MG tablet Take by mouth.    . calcitRIOL (ROCALTROL) 0.25 MCG capsule Take 0.25 mcg by mouth 3 (three) times a week.    . Coenzyme Q10 (CO Q-10) 100 MG CAPS Take 1 capsule by mouth daily.    Marland Kitchen diltiazem (CARDIZEM CD) 240 MG 24 hr capsule Take 240 mg by mouth daily.    Marland Kitchen epoetin alfa (EPOGEN) 3000 UNIT/ML injection Inject 3,000 Units into the vein every 14 (fourteen) days.    . furosemide (LASIX) 20 MG tablet Take 20 mg by mouth 2 (two) times  daily as needed for fluid.     Marland Kitchen insulin glargine (LANTUS) 100 UNIT/ML injection Inject 0.1 mLs (10 Units total) into the skin at bedtime. 10 mL 11  . Magnesium Oxide 400 MG CAPS Take 1 capsule (400 mg total) by mouth daily. 30 capsule 0  . pantoprazole (PROTONIX) 40 MG tablet Take 1 tablet by mouth daily.    . potassium chloride (KLOR-CON M10) 10 MEQ tablet Take 1 tablet (10 mEq total) by mouth daily. 30 tablet 0  . pravastatin (PRAVACHOL) 80 MG tablet Take 80 mg by mouth at bedtime.     Marland Kitchen spironolactone (ALDACTONE) 25 MG tablet Take 1 tablet by mouth daily.    . carvedilol (COREG) 3.125 MG tablet Take 1 tablet (3.125 mg total) by mouth 2 (two) times daily with a meal. 60 tablet 0  . hydrALAZINE (APRESOLINE) 50 MG tablet Take 1 tablet (50 mg total) by mouth every 8 (eight) hours. 90 tablet 0   No current facility-administered medications for this visit.    ROS:   General:  No weight loss, Fever, chills  HEENT: No recent headaches, no nasal bleeding, no visual changes, no sore throat  Neurologic: No dizziness, blackouts, seizures. No recent symptoms of stroke or mini- stroke. No recent episodes of slurred speech, or temporary blindness.  Cardiac: No recent episodes of chest pain/pressure, no shortness of breath at rest.  No shortness of breath with exertion.  Denies history of atrial fibrillation or irregular heartbeat  Vascular: No history of rest pain in feet.  No history of claudication.  No history of non-healing ulcer, No history of DVT   Pulmonary: No home oxygen, no productive cough, no hemoptysis,  No asthma or wheezing  Musculoskeletal:  [ ]  Arthritis, [ ]  Low back pain,  [ ]  Joint pain  Hematologic:No history of hypercoagulable state.  No history of easy bleeding.  No history of anemia  Gastrointestinal: No hematochezia or melena,  No gastroesophageal reflux, no trouble swallowing  Urinary: [X]  chronic Kidney disease, [ ]  on HD - [ ]  MWF or [ ]  TTHS, [ ]  Burning with  urination, [ ]  Frequent urination, [ ]  Difficulty urinating;   Skin: No rashes  Psychological: No history of anxiety,  No history of depression   Physical Examination  Vitals:   06/17/20 0955 06/17/20 0957  BP: (!) 234/155 (!) 237/94  Pulse: 65   Resp: 20   Temp: 98 F (36.7 C)   SpO2: 97%   Weight: 181 lb 4.8 oz (82.2 kg)   Height: 5\' 11"  (1.803 m)     Body mass index is 25.29 kg/m.  General:  Alert and oriented, no  acute distress HEENT: Normal Neck: No JVD Cardiac: Regular Rate and Rhythm Skin: No rash Extremity Pulses:  2+ radial, brachial pulses bilaterally Musculoskeletal: No deformity or edema  Neurologic: Upper and lower extremity motor 5/5 and symmetric  DATA:  Patient had a vein mapping ultrasound today as well as an upper extremity arterial duplex exam.  The upper extremity arterial duplex showed normal anatomy.  There was no significant obstruction.  Vein mapping showed no suitable veins for creation of a fistula.  I reviewed interpreted both the studies.  ASSESSMENT: Patient with no suitable vein for creation of a fistula.  He is CKD 5.  He is right-handed.   PLAN: Left upper arm AV graft scheduled for Monday, June 28, 2020.  Risk benefits possible complications of procedure details including but not limited to bleeding infection graft thrombosis ischemic steal were all discussed with the patient today.  He understands and agrees to proceed.   Ruta Hinds, MD [facility] Office: [phone]

## 2020-06-23 ENCOUNTER — Encounter (HOSPITAL_COMMUNITY)
Admission: RE | Admit: 2020-06-23 | Discharge: 2020-06-23 | Disposition: A | Discharge status: Home / Self Care | Payer: BC Managed Care – PPO | Source: Ambulatory Visit | Attending: Nephrology | Admitting: Nephrology

## 2020-06-23 ENCOUNTER — Encounter (HOSPITAL_COMMUNITY): Payer: Self-pay

## 2020-06-23 ENCOUNTER — Other Ambulatory Visit: Payer: Self-pay

## 2020-06-23 DIAGNOSIS — N184 Chronic kidney disease, stage 4 (severe): Secondary | ICD-10-CM | POA: Diagnosis not present

## 2020-06-23 LAB — POCT HEMOGLOBIN-HEMACUE: Hemoglobin: 7.3 g/dL — ABNORMAL LOW (ref 13.0–17.0)

## 2020-06-23 MED ORDER — EPOETIN ALFA 3000 UNIT/ML IJ SOLN
3000.0000 [IU] | Freq: Once | INTRAMUSCULAR | Status: DC
  Filled 2020-06-23: qty 1

## 2020-06-23 MED ORDER — EPOETIN ALFA 3000 UNIT/ML IJ SOLN
INTRAMUSCULAR | Status: AC
  Filled 2020-06-23: qty 1

## 2020-06-23 MED ORDER — EPOETIN ALFA 3000 UNIT/ML IJ SOLN
6000.0000 [IU] | Freq: Once | INTRAMUSCULAR | Status: AC
  Administered 2020-06-23: 6000 [IU] via SUBCUTANEOUS

## 2020-06-25 ENCOUNTER — Encounter (HOSPITAL_COMMUNITY): Payer: Self-pay | Admitting: Vascular Surgery

## 2020-06-25 ENCOUNTER — Emergency Department (HOSPITAL_COMMUNITY)
Admission: EM | Admit: 2020-06-25 | Discharge: 2020-06-25 | Disposition: A | Discharge status: Home / Self Care | Payer: BC Managed Care – PPO | Attending: Emergency Medicine | Admitting: Emergency Medicine

## 2020-06-25 ENCOUNTER — Other Ambulatory Visit: Payer: Self-pay

## 2020-06-25 ENCOUNTER — Encounter (HOSPITAL_COMMUNITY): Payer: Self-pay

## 2020-06-25 DIAGNOSIS — H6122 Impacted cerumen, left ear: Secondary | ICD-10-CM | POA: Diagnosis not present

## 2020-06-25 DIAGNOSIS — E1122 Type 2 diabetes mellitus with diabetic chronic kidney disease: Secondary | ICD-10-CM | POA: Insufficient documentation

## 2020-06-25 DIAGNOSIS — Z794 Long term (current) use of insulin: Secondary | ICD-10-CM | POA: Insufficient documentation

## 2020-06-25 DIAGNOSIS — N185 Chronic kidney disease, stage 5: Secondary | ICD-10-CM | POA: Diagnosis not present

## 2020-06-25 DIAGNOSIS — I120 Hypertensive chronic kidney disease with stage 5 chronic kidney disease or end stage renal disease: Secondary | ICD-10-CM | POA: Diagnosis not present

## 2020-06-25 DIAGNOSIS — Z79899 Other long term (current) drug therapy: Secondary | ICD-10-CM | POA: Diagnosis not present

## 2020-06-25 MED ORDER — CARBAMIDE PEROXIDE 6.5 % OT SOLN: 5.0000 [drp] | Freq: Two times a day (BID) | OTIC | 0 refills | Status: AC

## 2020-06-25 NOTE — ED Triage Notes (Signed)
Pt presents to ED with complaints of wax build up in left ear. Pt denies pain.

## 2020-06-25 NOTE — Discharge Instructions (Signed)
See ENT for further concerns next week if needed.

## 2020-06-25 NOTE — Anesthesia Preprocedure Evaluation (Addendum)
Anesthesia Evaluation  Patient identified by MRN, date of birth, ID band Patient awake    Reviewed: Allergy & Precautions, NPO status , Patient's Chart, lab work & pertinent test results, reviewed documented beta blocker date and time   History of Anesthesia Complications Negative for: history of anesthetic complications  Airway Mallampati: II  TM Distance: >3 FB Neck ROM: Full    Dental  (+) Dental Advisory Given, Poor Dentition, Missing, Chipped   Pulmonary neg pulmonary ROS,  06/26/2020 SARS coronavirus NEG   breath sounds clear to auscultation       Cardiovascular hypertension, Pt. on medications and Pt. 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)     Musculoskeletal   Abdominal   Peds  Hematology  (+) Blood dyscrasia (Hb 7.3), anemia ,   Anesthesia Other Findings   Reproductive/Obstetrics                            Anesthesia Physical Anesthesia Plan  ASA: III  Anesthesia Plan: General   Post-op Pain Management:    Induction: Intravenous  PONV Risk Score and Plan: 2 and Treatment may vary due to age or medical condition, Ondansetron and Dexamethasone  Airway Management Planned: LMA  Additional Equipment: None  Intra-op Plan:   Post-operative Plan:   Informed Consent: I have reviewed the patients History and Physical, chart, labs and discussed the procedure including the risks, benefits and alternatives for the proposed anesthesia with the patient or authorized representative who has indicated his/her understanding and acceptance.     Dental advisory given  Plan Discussed with: CRNA and Surgeon  Anesthesia Plan Comments: (PAT note written 06/25/2020  by Myra Gianotti, PA-C. )      Anesthesia Quick Evaluation

## 2020-06-25 NOTE — ED Provider Notes (Signed)
[facility] EMERGENCY DEPARTMENT Provider Note   CSN: [id number] Arrival date & time: 06/25/20  2563     History Chief Complaint  Patient presents with  . Foreign Body in Omar Parker is a 74 y.o. male.  Patient with diabetes, reflux, high blood pressure and kidney disease history presents with foreign body sensation and earwax buildup in the left ear.  Patient's had this in the past.  Patient denies other symptoms including fever cough or shortness of breath.        Past Medical History:  Diagnosis Date  . Anemia    IDA  . Diabetes mellitus without complication ([place])   . GERD (gastroesophageal reflux disease)   . High cholesterol   . Hypertension   . Stage 4 chronic kidney disease [facility])     Patient Active Problem List   Diagnosis Date Noted  . Lower GI bleeding 04/09/2020  . CKD (chronic kidney disease), stage V ([place]) 04/09/2020  . Acute blood loss anemia 04/09/2020  . Anemia in chronic kidney disease (CKD) 04/09/2020  . Type 2 diabetes mellitus with renal complication ([place]) 89/37/3428  . Diverticulosis 04/09/2020  . Aspirin long-term use 04/09/2020  . Uncontrolled hypertension 03/02/2020  . 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 Stage 4 chronic kidney disease ([place])   . Hypertension   . High cholesterol   . Absolute anemia 04/04/2017  . AKI (acute kidney injury) ([place]) 06/17/2016  . Dehydration 06/17/2016    Past Surgical History:  Procedure Laterality Date  . COLONOSCOPY N/A 05/16/2017   Procedure: COLONOSCOPY;  Surgeon: Rogene Houston, MD;  [facility];  Service: Endoscopy;  Laterality: N/A;  7:30  . COLONOSCOPY N/A 04/10/2020   Procedure: COLONOSCOPY;  Surgeon: Daneil Dolin, MD;  [facility];  Service: Endoscopy;  Laterality: N/A;  . ESOPHAGOGASTRODUODENOSCOPY N/A 04/10/2020   Procedure: ESOPHAGOGASTRODUODENOSCOPY (EGD);  Surgeon: Daneil Dolin, MD;  [facility];  Service: Endoscopy;  Laterality: N/A;  . HERNIA  REPAIR    . POLYPECTOMY  04/10/2020   Procedure: POLYPECTOMY;  Surgeon: Daneil Dolin, MD;  [facility];  Service: Endoscopy;;  cecal;       Family History  Problem Relation Age of Onset  . Chronic Renal Failure Neg Hx     Social History   Tobacco Use  . Smoking status: Never Smoker  . Smokeless tobacco: Never Used  Vaping Use  . Vaping Use: Never used  Substance Use Topics  . Alcohol use: Not Currently    Comment: no h/o heavy use  . Drug use: Not Currently    Types: Marijuana    Comment: none in 20 years    Home Medications Prior to Admission medications   Medication Sig Start Date End Date Taking? Authorizing Provider  acetaminophen (TYLENOL) 325 MG tablet Take 650 mg by mouth every 6 (six) hours as needed for moderate pain.     [provider]  calcitRIOL (ROCALTROL) 0.25 MCG capsule Take 0.25 mcg by mouth every Monday, Wednesday, and Friday.  05/07/20   [provider]  carbamide peroxide (DEBROX) 6.5 % OTIC solution Place 5 drops into the left ear 2 (two) times daily for 5 days. 06/25/20 06/30/20  Elnora Morrison, MD  carvedilol (COREG) 3.125 MG tablet Take 1 tablet (3.125 mg total) by mouth 2 (two) times daily with a meal. 04/11/20 06/21/20  Johnson, Clanford L, MD  Coenzyme Q10 (CO Q-10) 100 MG CAPS Take 100  mg by mouth daily.     [provider]  diltiazem (CARDIZEM CD) 240 MG 24 hr capsule Take 240 mg by mouth daily. 04/12/20   [provider]  epoetin alfa (EPOGEN) 3000 UNIT/ML injection Inject 6,000 Units into the vein every 14 (fourteen) days.     Bhutani, Manpreet S, MD  furosemide (LASIX) 20 MG tablet Take 20 mg by mouth 2 (two) times daily as needed for fluid.     [provider]  hydrALAZINE (APRESOLINE) 50 MG tablet Take 1 tablet (50 mg total) by mouth every 8 (eight) hours. 04/10/20 06/21/20  Johnson, Clanford L, MD  insulin glargine (LANTUS) 100 UNIT/ML injection Inject 0.1 mLs (10 Units total) into the skin at  bedtime. Patient taking differently: Inject 50 Units into the skin at bedtime.  04/12/20   Johnson, Clanford L, MD  Magnesium Oxide 400 MG CAPS Take 1 capsule (400 mg total) by mouth daily. 03/03/20   Geradine Girt, DO  minoxidil (LONITEN) 10 MG tablet Take 10 mg by mouth daily. 06/17/20   [provider]  pantoprazole (PROTONIX) 40 MG tablet Take 40 mg by mouth daily.     [provider]  potassium chloride (KLOR-CON M10) 10 MEQ tablet Take 1 tablet (10 mEq total) by mouth daily. 03/03/20   Geradine Girt, DO  pravastatin (PRAVACHOL) 80 MG tablet Take 80 mg by mouth at bedtime.     [provider]  spironolactone (ALDACTONE) 25 MG tablet Take 1 tablet by mouth daily. Patient not taking: Reported on 06/21/2020 04/02/20   [provider]    Allergies    Ace inhibitors  Review of Systems   Review of Systems  Constitutional: Negative for chills and fever.  HENT: Positive for ear pain. Negative for congestion and ear discharge.   Eyes: Negative for visual disturbance.  Respiratory: Negative for shortness of breath.   Cardiovascular: Negative for chest pain.  Gastrointestinal: Negative for abdominal pain and vomiting.  Genitourinary: Negative for dysuria and flank pain.  Musculoskeletal: Negative for back pain, neck pain and neck stiffness.  Skin: Negative for rash.  Neurological: Negative for light-headedness and headaches.    Physical Exam Updated Vital Signs BP (!) 193/66 (BP Location: Right Arm)   Pulse 94   Temp 98.1 F (36.7 C) (Oral)   Resp 18   Ht 5\' 11"  (1.803 m)   Wt 83.5 kg   SpO2 99%   BMI 25.66 kg/m   Physical Exam Vitals and nursing note reviewed.  Constitutional:      Appearance: He is well-developed.  HENT:     Head: Normocephalic and atraumatic.     Comments: Patient has cerumen left ear canal, tympanic membrane intact, no obvious insect or foreign body visualized, nontender to palpation. Eyes:     General:        Right eye:  No discharge.        Left eye: No discharge.     Conjunctiva/sclera: Conjunctivae normal.  Neck:     Trachea: No tracheal deviation.  Cardiovascular:     Rate and Rhythm: Normal rate.  Pulmonary:     Effort: Pulmonary effort is normal.  Abdominal:     General: There is no distension.     Palpations: Abdomen is soft.     Tenderness: There is no abdominal tenderness. There is no guarding.  Musculoskeletal:     Cervical back: Normal range of motion and neck supple.  Skin:    General: Skin is  warm.     Findings: No rash.  Neurological:     Mental Status: He is alert and oriented to person, place, and time.     ED Results / Procedures / Treatments   Labs (all labs ordered are listed, but only abnormal results are displayed) Labs Reviewed - No data to display  EKG None  Radiology No results found.  Procedures .Ear Cerumen Removal  Date/Time: 06/25/2020 12:23 PM Performed by: Elnora Morrison, MD Authorized by: Elnora Morrison, MD   Consent:    Consent obtained:  Verbal   Consent given by:  Patient   Risks discussed:  Bleeding and dizziness   Alternatives discussed:  No treatment Procedure details:    Location:  L ear   Procedure type: irrigation   Post-procedure details:    Inspection:  TM intact   Hearing quality:  Normal   Patient tolerance of procedure:  Tolerated well, no immediate complications   (including critical care time)  Medications Ordered in ED Medications - No data to display  ED Course  I have reviewed the triage vital signs and the nursing notes.  Pertinent labs & imaging results that were available during my care of the patient were reviewed by me and considered in my medical decision making (see chart for details).    MDM Rules/Calculators/A&P                          Patient presents with foreign body sensation and cerumen partial impaction.  Irrigated in the ER.  Plan for Debrox and outpatient follow-up with ENT if needed.   Final  Clinical Impression(s) / ED Diagnoses Final diagnoses:  Excessive cerumen in left ear canal    Rx / DC Orders ED Discharge Orders         Ordered    carbamide peroxide (DEBROX) 6.5 % OTIC solution  2 times daily        06/25/20 1220           Elnora Morrison, MD 06/25/20 1223

## 2020-06-25 NOTE — Progress Notes (Signed)
Spoke with pt for pre-op call. Pt denies cardiac history. Pt is treated for HTN and type 2 Diabetes. Pt states his fasting blood sugar is usually between 150-160. Instructed pt to take 1/2 of his regular dose of Lantus Insulin Sunday at bedtime, he will take 25 units. Instructed pt to check his blood sugar when he gets up Monday AM and every 2 hours until he leaves for the hospital. If blood sugar is 70 or below, treat with 1/2 cup of clear juice (apple or cranberry) and recheck blood sugar 15 minutes after drinking juice. If blood sugar continues to be 70 or below, call the Short Stay department and ask to speak to a nurse. He voiced understanding  Covid test scheduled for 06/26/20. Pt instructed to quarantine after getting the test done and to stay in quarantine until he comes to the hospital on Monday. Pt voiced understanding.

## 2020-06-25 NOTE — Progress Notes (Addendum)
Anesthesia Chart Review: SAME DAY WORK-UP   Case: [id number] Date/Time: 06/28/20 1338   Procedure: LEFT UPPER ARM ARTERIOVENOUS (AV) GORE-TEX GRAFT (Left )   Anesthesia type: Monitor Anesthesia Care   Pre-op diagnosis: CHRONIC KIDNEY DISEASE STAGE V   Location: [facility] OR ROOM 68 / [place] OR   Surgeons: Elam Dutch, MD      DISCUSSION: Patient is a 74 year old male scheduled for the above procedure. He is nearing need for hemodialysis.   History includes never smoker, DM2, HTN, hypercholesterolemia, anemia, CKD (stage V), GERD.   - ED visit 06/25/20 for left ear pain due to cerumen impaction. S/p cerumen removal and discharge home.   - Admission 04/09/20-04/10/20 for lower GI Bleed. Colonoscopy 04/10/20 showed diverticulosis, cecal polys (pathology: tubular adenoma negative for high grade dysplasia). Possible diverticulum bleed, but no stigmata. EGD 04/10/20 showed: normal esophagus, small hiatal hernia, erosive gastropathy with no stigmata of recent bleeding. Biopsy showed mild chronic gastritis, negative H. Pylori. Insulin adjusted due to hypoglycemia. Medications were adjusted for bradycardia and uncontrolled hypertension.   - Admission 03/02/20-03/03/20 for uncontrolled HTN, angioedema (likely "mild ACE-induced and lisinopril discontinued from med list), and progressive CKD with out-patient follow-up with nephrology recommended.   Historically BP has not been well controlled--another adjustment in meds at his 06/17/20 nephrology visit. Last HGB 7.3 on 06/23/20 (range 7.7-8.6 within ~ last month). He is a same day work-up, so labs, vitals, and anesthesia team evaluation on the day of surgery. He is scheduled for presurgical COVID-19 testing on 06/26/20.    VS:  BP Readings from Last 3 Encounters:  06/25/20 (!) 193/66  06/23/20 (!) 180/74  06/17/20 (!) 237/94   Pulse Readings from Last 3 Encounters:  06/25/20 94  06/23/20 90  06/17/20 65    PROVIDERS: Redmond School, MD is PCP  Ulice Bold, MBBS is nephrologist. Last visit 06/17/20. ([place]) Hildred Laser, MD is GI   LABS: For day of surgery. Currently, last results include: Lab Results  Component Value Date   WBC 7.7 04/10/2020   HGB 7.3 (L) 06/23/2020   HCT 27.6 (L) 04/10/2020   PLT 264 04/10/2020   GLUCOSE 146 (H) 04/10/2020   ALT 10 04/10/2020   AST 13 (L) 04/10/2020   NA 136 04/10/2020   K 3.7 04/10/2020   CL 105 04/10/2020   CREATININE 5.37 (H) 04/10/2020   BUN 76 (H) 04/10/2020   CO2 23 04/10/2020   TSH 2.661 04/09/2020   INR 1.1 04/09/2020   HGBA1C 6.9 (H) 03/02/2020     IMAGES: 1V PCXR 03/02/20: FINDINGS: Normal heart size and mediastinal contours. No acute infiltrate or edema. Increased density at the right apex attributed to the first costochondral junction, stable since at least 2009. No effusion or pneumothorax. No acute osseous findings. IMPRESSION: No active disease.  US Renal 05/06/20: IMPRESSION: - Mild increase in renal echogenicity, a finding indicative of medical renal disease. Renal cortical thickness normal bilaterally. No obstructing focus in either kidney. - Mildly complex cyst left kidney is stable compared to previous study in felt to have benign etiology. Simple cyst right kidney also stable.    EKG: EKG 03/02/20: Sinus rhythm Prolonged PR interval Probable left atrial enlargement Lateral T wave inversion No significant change since last tracing Confirmed by Davonna Belling [phone]) on 03/02/2020 10:14:15 AM - Lateral T wave inversion present on EKG tracings since 11/24/19.    CV:  Past Medical History:  Diagnosis Date  . Anemia  IDA  . Diabetes mellitus without complication ([place])   . GERD (gastroesophageal reflux disease)   . High cholesterol   . Hypertension   . Stage 4 chronic kidney disease [facility])     Past Surgical History:  Procedure Laterality Date  . COLONOSCOPY N/A 05/16/2017   Procedure: COLONOSCOPY;  Surgeon:  Rogene Houston, MD;  [facility];  Service: Endoscopy;  Laterality: N/A;  7:30  . COLONOSCOPY N/A 04/10/2020   Procedure: COLONOSCOPY;  Surgeon: Daneil Dolin, MD;  [facility];  Service: Endoscopy;  Laterality: N/A;  . ESOPHAGOGASTRODUODENOSCOPY N/A 04/10/2020   Procedure: ESOPHAGOGASTRODUODENOSCOPY (EGD);  Surgeon: Daneil Dolin, MD;  [facility];  Service: Endoscopy;  Laterality: N/A;  . HERNIA REPAIR    . POLYPECTOMY  04/10/2020   Procedure: POLYPECTOMY;  Surgeon: Daneil Dolin, MD;  [facility];  Service: Endoscopy;;  cecal;    MEDICATIONS: No current facility-administered medications for this encounter.   Marland Kitchen acetaminophen (TYLENOL) 325 MG tablet  . calcitRIOL (ROCALTROL) 0.25 MCG capsule  . carvedilol (COREG) 3.125 MG tablet  . Coenzyme Q10 (CO Q-10) 100 MG CAPS  . diltiazem (CARDIZEM CD) 240 MG 24 hr capsule  . epoetin alfa (EPOGEN) 3000 UNIT/ML injection  . furosemide (LASIX) 20 MG tablet  . hydrALAZINE (APRESOLINE) 50 MG tablet  . insulin glargine (LANTUS) 100 UNIT/ML injection  . Magnesium Oxide 400 MG CAPS  . minoxidil (LONITEN) 10 MG tablet  . pantoprazole (PROTONIX) 40 MG tablet  . potassium chloride (KLOR-CON M10) 10 MEQ tablet  . pravastatin (PRAVACHOL) 80 MG tablet  . carbamide peroxide (DEBROX) 6.5 % OTIC solution  . spironolactone (ALDACTONE) 25 MG tablet    Myra Gianotti, PA-C Surgical Short Stay/Anesthesiology [facility] Phone [phone] [facility] Phone [phone] 06/25/2020 2:23 PM

## 2020-06-26 ENCOUNTER — Other Ambulatory Visit (HOSPITAL_COMMUNITY)
Admission: RE | Admit: 2020-06-26 | Discharge: 2020-06-26 | Disposition: A | Discharge status: Home / Self Care | Payer: BC Managed Care – PPO | Source: Ambulatory Visit | Attending: Vascular Surgery | Admitting: Vascular Surgery

## 2020-06-26 DIAGNOSIS — Z01812 Encounter for preprocedural laboratory examination: Secondary | ICD-10-CM | POA: Insufficient documentation

## 2020-06-26 DIAGNOSIS — Z20822 Contact with and (suspected) exposure to covid-19: Secondary | ICD-10-CM | POA: Diagnosis not present

## 2020-06-26 LAB — SARS CORONAVIRUS 2 (TAT 6-24 HRS): SARS Coronavirus 2: NEGATIVE

## 2020-06-28 ENCOUNTER — Ambulatory Visit (HOSPITAL_COMMUNITY): Payer: BC Managed Care – PPO | Admitting: Vascular Surgery

## 2020-06-28 ENCOUNTER — Encounter (HOSPITAL_COMMUNITY)
Admission: RE | Disposition: A | Discharge status: Home / Self Care | Payer: Self-pay | Source: Home / Self Care | Attending: Vascular Surgery

## 2020-06-28 ENCOUNTER — Ambulatory Visit (HOSPITAL_COMMUNITY): Payer: BC Managed Care – PPO

## 2020-06-28 ENCOUNTER — Ambulatory Visit (HOSPITAL_COMMUNITY)
Admission: RE | Admit: 2020-06-28 | Discharge: 2020-06-28 | Disposition: A | Discharge status: Home / Self Care | Payer: BC Managed Care – PPO | Attending: Vascular Surgery | Admitting: Vascular Surgery

## 2020-06-28 DIAGNOSIS — I120 Hypertensive chronic kidney disease with stage 5 chronic kidney disease or end stage renal disease: Secondary | ICD-10-CM | POA: Insufficient documentation

## 2020-06-28 DIAGNOSIS — E1122 Type 2 diabetes mellitus with diabetic chronic kidney disease: Secondary | ICD-10-CM | POA: Insufficient documentation

## 2020-06-28 DIAGNOSIS — N185 Chronic kidney disease, stage 5: Secondary | ICD-10-CM | POA: Diagnosis not present

## 2020-06-28 DIAGNOSIS — K219 Gastro-esophageal reflux disease without esophagitis: Secondary | ICD-10-CM | POA: Insufficient documentation

## 2020-06-28 DIAGNOSIS — Z888 Allergy status to other drugs, medicaments and biological substances status: Secondary | ICD-10-CM | POA: Insufficient documentation

## 2020-06-28 DIAGNOSIS — D631 Anemia in chronic kidney disease: Secondary | ICD-10-CM | POA: Insufficient documentation

## 2020-06-28 DIAGNOSIS — Z992 Dependence on renal dialysis: Secondary | ICD-10-CM

## 2020-06-28 DIAGNOSIS — Z794 Long term (current) use of insulin: Secondary | ICD-10-CM | POA: Insufficient documentation

## 2020-06-28 DIAGNOSIS — E7800 Pure hypercholesterolemia, unspecified: Secondary | ICD-10-CM | POA: Insufficient documentation

## 2020-06-28 DIAGNOSIS — J811 Chronic pulmonary edema: Secondary | ICD-10-CM | POA: Insufficient documentation

## 2020-06-28 DIAGNOSIS — N186 End stage renal disease: Secondary | ICD-10-CM | POA: Insufficient documentation

## 2020-06-28 DIAGNOSIS — Z86010 Personal history of colonic polyps: Secondary | ICD-10-CM | POA: Insufficient documentation

## 2020-06-28 DIAGNOSIS — Z79899 Other long term (current) drug therapy: Secondary | ICD-10-CM | POA: Insufficient documentation

## 2020-06-28 HISTORY — PX: INSERTION OF DIALYSIS CATHETER: SHX1324

## 2020-06-28 HISTORY — PX: AV FISTULA PLACEMENT: SHX1204

## 2020-06-28 LAB — GLUCOSE, CAPILLARY
Glucose-Capillary: 151 mg/dL — ABNORMAL HIGH (ref 70–99)
Glucose-Capillary: 137 mg/dL — ABNORMAL HIGH (ref 70–99)

## 2020-06-28 LAB — POCT I-STAT, CHEM 8
BUN: 46 mg/dL — ABNORMAL HIGH (ref 8–23)
Calcium, Ion: 0.97 mmol/L — ABNORMAL LOW (ref 1.15–1.40)
Chloride: 107 mmol/L (ref 98–111)
Creatinine, Ser: 6.5 mg/dL — ABNORMAL HIGH (ref 0.61–1.24)
Glucose, Bld: 171 mg/dL — ABNORMAL HIGH (ref 70–99)
HCT: 21 % — ABNORMAL LOW (ref 39.0–52.0)
Hemoglobin: 7.1 g/dL — ABNORMAL LOW (ref 13.0–17.0)
Potassium: 3.7 mmol/L (ref 3.5–5.1)
Sodium: 141 mmol/L (ref 135–145)
TCO2: 21 mmol/L — ABNORMAL LOW (ref 22–32)

## 2020-06-28 SURGERY — INSERTION OF ARTERIOVENOUS (AV) GORE-TEX GRAFT ARM
Anesthesia: General | Laterality: Left

## 2020-06-28 MED ORDER — LIDOCAINE HCL (PF) 1 % IJ SOLN
INTRAMUSCULAR | Status: AC
  Filled 2020-06-28: qty 30

## 2020-06-28 MED ORDER — FENTANYL CITRATE (PF) 100 MCG/2ML IJ SOLN: 25.0000 ug | INTRAMUSCULAR | Status: DC | PRN

## 2020-06-28 MED ORDER — SODIUM CHLORIDE 0.9 % IV SOLN: INTRAVENOUS | Status: DC | PRN

## 2020-06-28 MED ORDER — HEPARIN SODIUM (PORCINE) 1000 UNIT/ML IJ SOLN
INTRAMUSCULAR | Status: AC
  Filled 2020-06-28: qty 1

## 2020-06-28 MED ORDER — CEFAZOLIN SODIUM-DEXTROSE 2-4 GM/100ML-% IV SOLN
2.0000 g | INTRAVENOUS | Status: AC
  Administered 2020-06-28: 2 g via INTRAVENOUS

## 2020-06-28 MED ORDER — GLYCOPYRROLATE PF 0.2 MG/ML IJ SOSY
PREFILLED_SYRINGE | INTRAMUSCULAR | Status: AC
  Filled 2020-06-28: qty 2

## 2020-06-28 MED ORDER — HYDROCODONE-ACETAMINOPHEN 5-325 MG PO TABS: 1.0000 | ORAL_TABLET | Freq: Four times a day (QID) | ORAL | 0 refills | Status: DC | PRN

## 2020-06-28 MED ORDER — SODIUM CHLORIDE 0.9 % IV SOLN
INTRAVENOUS | Status: AC
  Filled 2020-06-28: qty 1.2

## 2020-06-28 MED ORDER — OXYCODONE HCL 5 MG PO TABS: 5.0000 mg | ORAL_TABLET | Freq: Once | ORAL | Status: DC | PRN

## 2020-06-28 MED ORDER — MIDAZOLAM HCL 2 MG/2ML IJ SOLN: 0.5000 mg | Freq: Once | INTRAMUSCULAR | Status: DC | PRN

## 2020-06-28 MED ORDER — PROMETHAZINE HCL 25 MG/ML IJ SOLN: 6.2500 mg | INTRAMUSCULAR | Status: DC | PRN

## 2020-06-28 MED ORDER — CHLORHEXIDINE GLUCONATE 0.12 % MT SOLN: 15.0000 mL | Freq: Once | OROMUCOSAL | Status: AC

## 2020-06-28 MED ORDER — PROPOFOL 10 MG/ML IV BOLUS
INTRAVENOUS | Status: DC | PRN
  Administered 2020-06-28: 200 mg via INTRAVENOUS

## 2020-06-28 MED ORDER — ORAL CARE MOUTH RINSE: 15.0000 mL | Freq: Once | OROMUCOSAL | Status: AC

## 2020-06-28 MED ORDER — SODIUM CHLORIDE 0.9 % IV SOLN: INTRAVENOUS | Status: DC

## 2020-06-28 MED ORDER — PROTAMINE SULFATE 10 MG/ML IV SOLN
INTRAVENOUS | Status: DC | PRN
  Administered 2020-06-28: 20 mg via INTRAVENOUS
  Administered 2020-06-28: 30 mg via INTRAVENOUS

## 2020-06-28 MED ORDER — HEPARIN SODIUM (PORCINE) 1000 UNIT/ML IJ SOLN
INTRAMUSCULAR | Status: DC | PRN
  Administered 2020-06-28: 5000 [IU] via INTRAVENOUS

## 2020-06-28 MED ORDER — LIDOCAINE 2% (20 MG/ML) 5 ML SYRINGE
INTRAMUSCULAR | Status: DC | PRN
  Administered 2020-06-28: 40 mg via INTRAVENOUS

## 2020-06-28 MED ORDER — PHENYLEPHRINE HCL-NACL 10-0.9 MG/250ML-% IV SOLN
INTRAVENOUS | Status: DC | PRN
  Administered 2020-06-28: 80 ug/min via INTRAVENOUS

## 2020-06-28 MED ORDER — ALBUMIN HUMAN 5 % IV SOLN: INTRAVENOUS | Status: DC | PRN

## 2020-06-28 MED ORDER — CHLORHEXIDINE GLUCONATE 0.12 % MT SOLN
OROMUCOSAL | Status: AC
  Administered 2020-06-28: 15 mL via OROMUCOSAL
  Filled 2020-06-28: qty 15

## 2020-06-28 MED ORDER — FENTANYL CITRATE (PF) 100 MCG/2ML IJ SOLN
INTRAMUSCULAR | Status: DC | PRN
Start: 2020-06-28 — End: 2020-06-28
  Administered 2020-06-28 (×6): 25 ug via INTRAVENOUS

## 2020-06-28 MED ORDER — FENTANYL CITRATE (PF) 250 MCG/5ML IJ SOLN
INTRAMUSCULAR | Status: AC
  Filled 2020-06-28: qty 5

## 2020-06-28 MED ORDER — LIDOCAINE 2% (20 MG/ML) 5 ML SYRINGE
INTRAMUSCULAR | Status: AC
  Filled 2020-06-28: qty 5

## 2020-06-28 MED ORDER — PHENYLEPHRINE 40 MCG/ML (10ML) SYRINGE FOR IV PUSH (FOR BLOOD PRESSURE SUPPORT)
PREFILLED_SYRINGE | INTRAVENOUS | Status: DC | PRN
  Administered 2020-06-28 (×3): 80 ug via INTRAVENOUS

## 2020-06-28 MED ORDER — 0.9 % SODIUM CHLORIDE (POUR BTL) OPTIME
TOPICAL | Status: DC | PRN
  Administered 2020-06-28: 1000 mL

## 2020-06-28 MED ORDER — ONDANSETRON HCL 4 MG/2ML IJ SOLN
INTRAMUSCULAR | Status: DC | PRN
  Administered 2020-06-28: 4 mg via INTRAVENOUS

## 2020-06-28 MED ORDER — EPHEDRINE SULFATE-NACL 50-0.9 MG/10ML-% IV SOSY
PREFILLED_SYRINGE | INTRAVENOUS | Status: DC | PRN
  Administered 2020-06-28 (×4): 10 mg via INTRAVENOUS

## 2020-06-28 MED ORDER — CHLORHEXIDINE GLUCONATE 4 % EX LIQD: 60.0000 mL | Freq: Once | CUTANEOUS | Status: DC

## 2020-06-28 MED ORDER — CEFAZOLIN SODIUM-DEXTROSE 2-4 GM/100ML-% IV SOLN
INTRAVENOUS | Status: AC
  Filled 2020-06-28: qty 100

## 2020-06-28 MED ORDER — PROPOFOL 10 MG/ML IV BOLUS
INTRAVENOUS | Status: AC
  Filled 2020-06-28: qty 20

## 2020-06-28 MED ORDER — OXYCODONE HCL 5 MG/5ML PO SOLN: 5.0000 mg | Freq: Once | ORAL | Status: DC | PRN

## 2020-06-28 MED ORDER — HEPARIN SODIUM (PORCINE) 1000 UNIT/ML IJ SOLN
INTRAMUSCULAR | Status: DC | PRN
  Administered 2020-06-28: 3200 [IU] via INTRAVENOUS

## 2020-06-28 MED ORDER — MEPERIDINE HCL 25 MG/ML IJ SOLN: 6.2500 mg | INTRAMUSCULAR | Status: DC | PRN

## 2020-06-28 SURGICAL SUPPLY — 56 items
ADH SKN CLS APL DERMABOND .7 (GAUZE/BANDAGES/DRESSINGS) ×4
AGENT HMST SPONGE THK3/8 (HEMOSTASIS)
APL PRP STRL LF DISP 70% ISPRP (MISCELLANEOUS) ×2
ARMBAND PINK RESTRICT EXTREMIT (MISCELLANEOUS) ×3 IMPLANT
BAG DECANTER FOR FLEXI CONT (MISCELLANEOUS) ×3 IMPLANT
BIOPATCH RED 1 DISK 7.0 (GAUZE/BANDAGES/DRESSINGS) ×3 IMPLANT
CANISTER SUCT 3000ML PPV (MISCELLANEOUS) ×3 IMPLANT
CANNULA VESSEL 3MM 2 BLNT TIP (CANNULA) ×3 IMPLANT
CATH PALINDROME-P 19CM W/VT (CATHETERS) ×3 IMPLANT
CATH PALINDROME-P 23CM W/VT (CATHETERS) IMPLANT
CATH PALINDROME-P 28CM W/VT (CATHETERS) IMPLANT
CATH STRAIGHT 5FR 65CM (CATHETERS) IMPLANT
CHLORAPREP W/TINT 26 (MISCELLANEOUS) ×3 IMPLANT
CLIP VESOCCLUDE MED 6/CT (CLIP) ×3 IMPLANT
CLIP VESOCCLUDE SM WIDE 6/CT (CLIP) ×3 IMPLANT
COVER PROBE W GEL 5X96 (DRAPES) ×3 IMPLANT
COVER SURGICAL LIGHT HANDLE (MISCELLANEOUS) ×3 IMPLANT
COVER WAND RF STERILE (DRAPES) IMPLANT
DECANTER SPIKE VIAL GLASS SM (MISCELLANEOUS) ×3 IMPLANT
DERMABOND ADVANCED (GAUZE/BANDAGES/DRESSINGS) ×2
DERMABOND ADVANCED .7 DNX12 (GAUZE/BANDAGES/DRESSINGS) ×4 IMPLANT
DRAPE C-ARM 42X72 X-RAY (DRAPES) IMPLANT
DRAPE CHEST BREAST 15X10 FENES (DRAPES) ×3 IMPLANT
ELECT REM PT RETURN 9FT ADLT (ELECTROSURGICAL) ×3
ELECTRODE REM PT RTRN 9FT ADLT (ELECTROSURGICAL) ×2 IMPLANT
GAUZE 4X4 16PLY RFD (DISPOSABLE) ×3 IMPLANT
GLOVE BIO SURGEON STRL SZ7.5 (GLOVE) ×6 IMPLANT
GOWN STRL REUS W/ TWL LRG LVL3 (GOWN DISPOSABLE) ×6 IMPLANT
GOWN STRL REUS W/TWL LRG LVL3 (GOWN DISPOSABLE) ×9
GRAFT GORETEX STRT 4-7X45 (Vascular Products) ×3 IMPLANT
HEMOSTAT SPONGE AVITENE ULTRA (HEMOSTASIS) IMPLANT
KIT BASIN OR (CUSTOM PROCEDURE TRAY) ×6 IMPLANT
KIT PALINDROME-P 55CM (CATHETERS) IMPLANT
KIT TURNOVER KIT B (KITS) ×3 IMPLANT
NEEDLE 18GX1X1/2 (RX/OR ONLY) (NEEDLE) ×3 IMPLANT
NEEDLE HYPO 25GX1X1/2 BEV (NEEDLE) IMPLANT
NS IRRIG 1000ML POUR BTL (IV SOLUTION) ×6 IMPLANT
PACK CV ACCESS (CUSTOM PROCEDURE TRAY) ×3 IMPLANT
PACK SURGICAL SETUP 50X90 (CUSTOM PROCEDURE TRAY) IMPLANT
PAD ARMBOARD 7.5X6 YLW CONV (MISCELLANEOUS) ×12 IMPLANT
SET MICROPUNCTURE 5F STIFF (MISCELLANEOUS) IMPLANT
SUT ETHILON 3 0 PS 1 (SUTURE) ×3 IMPLANT
SUT PROLENE 6 0 CC (SUTURE) ×12 IMPLANT
SUT VIC AB 3-0 SH 27 (SUTURE) ×6
SUT VIC AB 3-0 SH 27X BRD (SUTURE) ×4 IMPLANT
SUT VICRYL 4-0 PS2 18IN ABS (SUTURE) ×9 IMPLANT
SYR 10ML LL (SYRINGE) ×3 IMPLANT
SYR 20ML LL LF (SYRINGE) ×6 IMPLANT
SYR 5ML LL (SYRINGE) ×3 IMPLANT
SYR CONTROL 10ML LL (SYRINGE) ×3 IMPLANT
SYR TOOMEY 50ML (SYRINGE) IMPLANT
TOWEL GREEN STERILE (TOWEL DISPOSABLE) ×9 IMPLANT
TOWEL GREEN STERILE FF (TOWEL DISPOSABLE) ×3 IMPLANT
UNDERPAD 30X36 HEAVY ABSORB (UNDERPADS AND DIAPERS) ×3 IMPLANT
WATER STERILE IRR 1000ML POUR (IV SOLUTION) ×6 IMPLANT
WIRE AMPLATZ SS-J .035X180CM (WIRE) IMPLANT

## 2020-06-28 NOTE — Transfer of Care (Signed)
Immediate Anesthesia Transfer of Care Note  Patient: Omar Parker  Procedure(s) Performed: LEFT UPPER ARM ARTERIOVENOUS (AV) GORE-TEX GRAFT (Left ) INSERTION OF DIALYSIS CATHETER  Patient Location: PACU  Anesthesia Type:General  Level of Consciousness: drowsy  Airway & Oxygen Therapy: Patient Spontanous Breathing and Patient connected to nasal cannula oxygen  Post-op Assessment: Report given to RN, Post -op Vital signs reviewed and stable and Patient moving all extremities  Post vital signs: Reviewed and stable  Last Vitals:  Vitals Value Taken Time  BP 132/65 06/28/20 1459  Temp 36.7 C 06/28/20 1459  Pulse 65 06/28/20 1504  Resp 18 06/28/20 1504  SpO2 94 % 06/28/20 1504  Vitals shown include unvalidated device data.  Last Pain:  Vitals:   06/28/20 1159  TempSrc:   PainSc: 0-No pain         Complications: No complications documented.

## 2020-06-28 NOTE — Progress Notes (Signed)
PDMP site reviewed prior to prescribing post op narcotics.  Leontine Locket, [facility] 06/28/2020 2:50 PM

## 2020-06-28 NOTE — Interval H&P Note (Signed)
History and Physical Interval Note:  06/28/2020 12:11 PM  Omar Parker  has presented today for surgery, with the diagnosis of CHRONIC KIDNEY DISEASE STAGE V.  The various methods of treatment have been discussed with the patient and family. After consideration of risks, benefits and other options for treatment, the patient has consented to  Procedure(s): LEFT UPPER ARM ARTERIOVENOUS (AV) GORE-TEX GRAFT (Left) as a surgical intervention.  The patient's history has been reviewed, patient examined, no change in status, stable for surgery.  I have reviewed the patient's chart and labs.  Questions were answered to the patient's satisfaction.     Ruta Hinds

## 2020-06-28 NOTE — Op Note (Signed)
Procedure: Ultrasound right neck, insertion of 19 cm palindrome catheter, placement left upper arm AV graft (4 to 7 mm taper)  Preoperative diagnosis: End-stage renal disease  Postoperative diagnosis: Same  Assistant: Leontine Locket, PA-C for assistance in exposure expediting procedure and creation of anastomosis  Anesthesia: General  Operative findings: 1.  High brachial bifurcation each paired artery was about 3 mm in diameter  2.  5 mm axillary vein end to side  Operative details: After obtaining informed consent, the patient is taken the operating.  The patient was placed in supine position operating table.  After induction of general anesthesia placement of a laryngeal mask patient's entire neck and chest was prepped and draped in usual sterile fashion.  Local anesthesia was infiltrated at the base of the patient's right neck.  Ultrasound was used to identify the right internal jugular vein.  This had normal compressibility and respiratory variation.  An introducer needle was used to cannulate the right internal jugular vein and a 3 5 J-tip guidewire threaded into the inferior vena cava under fluoroscopic guidance.  Next a small incision was made in the infraclavicular position out laterally on the right side.  The palindrome catheter was then tunneled through this.  It was 19 cm in length.  The tract over the wire was then dilated with serial dilators and the catheter placed through the peel-away sheath into the right atrium.  The peel-away sheath was then removed.  The neck insertion site was closed with a Vicryl stitch.  Catheter was sutured the skin with nylon sutures.  The catheter was noted to flush and draw easily.  It was inspected under fluoroscopy, with its tip to be in the right atrium looking throughout its course.  At this point the patient's left upper extremities prepped and draped usual sterile fashion.  Longitudinal incision was made adjacent to the antecubital crease in the  left arm.  Incision was carried on through the subcutaneous tissues down the level of what was thought initially to be the brachial artery.  However this was fairly small in caliber and suggestive of a high brachial bifurcation.  There is an additional branch which was slightly deeper than this and slightly larger in diameter.  Both branches were about 3 mm in diameter.  Next a longitudinal incision was made in the left axilla carried on through the subtenons tissues down the level axillary vein.  Small but side branches were ligated and divided tween silk ties.  The vein was of good quality about 5 mm in diameter.  A subcutaneous tunnel was then created connecting the upper arm to the lower arm incision.  A 47 mm tapered PTFE graft was brought through this.  Patient was given 5000 intravenous heparin.  The artery was controlled proximally and distally with Vesseloops.  A longitudinal opening was made in the artery.  The deeper branch was selected.  Graft was beveled and sewn endograft to side of artery using a running 6-0 Prolene suture.  Just prior to completion of the anastomosis it was for blood backbled and thoroughly flushed.  Anastomosis was secured clamps released and there is palpable flow in the graft immediately.  Graft was then cut to length and the axillary vein controlled proximally and distally with viable blood clamps.  Longitudinal opening was made in the vein and the graft sewn end of graft to side of vein using a running 6-0 Prolene suture.  Despite completion of anastomosis it was for blood backbled and thoroughly flushed.  Anastomosis was secured clamps released there is palpable thrill in the graft immediately.  There was good Doppler flow at the level of the hand.  Hemostasis was obtained with direct pressure and 50 mg of protamine.  Patient tolerated procedure well and there were no complications.  The incisions were closed in multiple layers with running 3-0 Vicryl suture for Vicryl  subcuticular stitch in the skin.  Dermabond was applied.  Instrument sponge and needle count was correct end of the case.  Ruta Hinds, MD [facility] [place] Office: [phone]

## 2020-06-28 NOTE — Anesthesia Postprocedure Evaluation (Signed)
Anesthesia Post Note  Patient: ESIQUIO BOESEN  Procedure(s) Performed: LEFT UPPER ARM ARTERIOVENOUS (AV) GORE-TEX GRAFT (Left ) INSERTION OF DIALYSIS CATHETER     Patient location during evaluation: PACU Anesthesia Type: General Level of consciousness: awake and alert, patient cooperative and oriented Pain management: pain level controlled Vital Signs Assessment: post-procedure vital signs reviewed and stable Respiratory status: spontaneous breathing, nonlabored ventilation and respiratory function stable Cardiovascular status: blood pressure returned to baseline and stable Postop Assessment: no apparent nausea or vomiting Anesthetic complications: no   No complications documented.  Last Vitals:  Vitals:   06/28/20 1612 06/28/20 1615  BP: 140/67 140/67  Pulse: 74 75  Resp: 20 20  Temp:    SpO2: 94% 93%    Last Pain:  Vitals:   06/28/20 1615  TempSrc:   PainSc: 0-No pain                 Keegan Ducey,E. Chadwick Reiswig

## 2020-06-28 NOTE — Discharge Instructions (Signed)
[facility]  Discharge Instructions  AV Fistula or Graft Surgery for Dialysis Access  Please refer to the following instructions for your post-procedure care. Your surgeon or physician assistant will discuss any changes with you.  Activity  You may drive the day following your surgery, if you are comfortable and no longer taking prescription pain medication. Resume full activity as the soreness in your incision resolves.  Bathing/Showering  You may shower after you go home. Keep your incision dry for 48 hours. Do not soak in a bathtub, hot tub, or swim until the incision heals completely. You may not shower if you have a hemodialysis catheter.  Incision Care  Clean your incision with mild soap and water after 48 hours. Pat the area dry with a clean towel. You do not need a bandage unless otherwise instructed. Do not apply any ointments or creams to your incision. You may have skin glue on your incision. Do not peel it off. It will come off on its own in about one week. Your arm may swell a bit after surgery. To reduce swelling use pillows to elevate your arm so it is above your heart. Your doctor will tell you if you need to lightly wrap your arm with an ACE bandage.  Diet  Resume your normal diet. There are not special food restrictions following this procedure. In order to heal from your surgery, it is CRITICAL to get adequate nutrition. Your body requires vitamins, minerals, and protein. Vegetables are the best source of vitamins and minerals. Vegetables also provide the perfect balance of protein. Processed food has little nutritional value, so try to avoid this.  Medications  Resume taking all of your medications. If your incision is causing pain, you may take over-the counter pain relievers such as acetaminophen (Tylenol). If you were prescribed a stronger pain medication, please be aware these medications can cause nausea and constipation. Prevent  nausea by taking the medication with a snack or meal. Avoid constipation by drinking plenty of fluids and eating foods with high amount of fiber, such as fruits, vegetables, and grains.  Do not take Tylenol if you are taking prescription pain medications.  Follow up Your surgeon may want to see you in the office following your access surgery. If so, this will be arranged at the time of your surgery.  Please call us immediately for any of the following conditions:   Increased pain, redness, drainage (pus) from your incision site  Fever of 101 degrees or higher  Severe or worsening pain at your incision site  Hand pain or numbness.   Reduce your risk of vascular disease:   Stop smoking. If you would like help, call [facility] at 1-800-QUIT-NOW 7205653370) or [place] at [place] your cholesterol  Maintain a desired weight  Control your diabetes  Keep your blood pressure down  Dialysis  It will take several weeks to several months for your new dialysis access to be ready for use. Your surgeon will determine when it is okay to use it. Your nephrologist will continue to direct your dialysis. You can continue to use your Permcath until your new access is ready for use.   06/28/2020 Omar Parker [id number] 1946/08/29  Surgeon(s): Fields, Jessy Oto, MD  Procedure(s): LEFT UPPER ARM ARTERIOVENOUS (AV) GORE-TEX GRAFT INSERTION OF DIALYSIS CATHETER  x Do not stick graft for 4 weeks    If you have any questions, please call the office at [phone].

## 2020-06-28 NOTE — Anesthesia Procedure Notes (Signed)
Procedure Name: LMA Insertion Date/Time: 06/28/2020 12:46 PM Performed by: Leith Szafranski T, CRNA Pre-anesthesia Checklist: Patient identified, Emergency Drugs available, Suction available and Patient being monitored Patient Re-evaluated:Patient Re-evaluated prior to induction Oxygen Delivery Method: Circle system utilized Preoxygenation: Pre-oxygenation with 100% oxygen Induction Type: Combination inhalational/ intravenous induction Ventilation: Mask ventilation without difficulty LMA: LMA inserted LMA Size: 5.0 Number of attempts: 1 Placement Confirmation: positive ETCO2 and breath sounds checked- equal and bilateral Tube secured with: Tape Dental Injury: Teeth and Oropharynx as per pre-operative assessment

## 2020-06-29 ENCOUNTER — Other Ambulatory Visit: Payer: Self-pay | Admitting: Nephrology

## 2020-06-29 ENCOUNTER — Encounter (HOSPITAL_COMMUNITY): Payer: Self-pay | Admitting: Vascular Surgery

## 2020-06-29 ENCOUNTER — Encounter: Payer: Self-pay | Admitting: Nephrology

## 2020-06-29 DIAGNOSIS — N185 Chronic kidney disease, stage 5: Secondary | ICD-10-CM

## 2020-06-29 NOTE — Progress Notes (Signed)
Will place quantiferon orders

## 2020-06-29 NOTE — Progress Notes (Signed)
Will place Orders for hepatitis profile

## 2020-06-30 ENCOUNTER — Encounter (HOSPITAL_COMMUNITY)
Admission: RE | Admit: 2020-06-30 | Discharge: 2020-06-30 | Disposition: A | Discharge status: Home / Self Care | Payer: BC Managed Care – PPO | Source: Ambulatory Visit | Attending: Nephrology | Admitting: Nephrology

## 2020-06-30 ENCOUNTER — Other Ambulatory Visit: Payer: Self-pay

## 2020-06-30 ENCOUNTER — Other Ambulatory Visit (HOSPITAL_COMMUNITY)
Admission: RE | Admit: 2020-06-30 | Discharge: 2020-06-30 | Disposition: A | Discharge status: Home / Self Care | Payer: BC Managed Care – PPO | Source: Ambulatory Visit | Attending: Nephrology | Admitting: Nephrology

## 2020-06-30 DIAGNOSIS — N185 Chronic kidney disease, stage 5: Secondary | ICD-10-CM | POA: Insufficient documentation

## 2020-06-30 DIAGNOSIS — D631 Anemia in chronic kidney disease: Secondary | ICD-10-CM | POA: Insufficient documentation

## 2020-06-30 LAB — PREPARE RBC (CROSSMATCH)

## 2020-06-30 LAB — HEPATITIS B SURFACE ANTIGEN: Hepatitis B Surface Ag: NONREACTIVE

## 2020-06-30 LAB — HEPATITIS C ANTIBODY: HCV Ab: NONREACTIVE

## 2020-06-30 LAB — HEMOGLOBIN AND HEMATOCRIT, BLOOD
HCT: 25 % — ABNORMAL LOW (ref 39.0–52.0)
Hemoglobin: 7.9 g/dL — ABNORMAL LOW (ref 13.0–17.0)

## 2020-06-30 LAB — HEPATITIS B SURFACE ANTIBODY,QUALITATIVE: Hep B S Ab: NONREACTIVE

## 2020-06-30 LAB — HEPATITIS B CORE ANTIBODY, TOTAL: Hep B Core Total Ab: NONREACTIVE

## 2020-06-30 NOTE — Progress Notes (Signed)
Here for blood draw. Blood drawn for Hgb/Hct and type/crossmatch and sent to lab for results. Consent signed. Instructed to arrive in AM at 0900 for transfusion. Voiced understanding.

## 2020-07-01 ENCOUNTER — Encounter (HOSPITAL_COMMUNITY): Admission: RE | Admit: 2020-07-01 | Payer: BC Managed Care – PPO | Source: Ambulatory Visit

## 2020-07-01 LAB — TYPE AND SCREEN
ABO/RH(D): A POS
Antibody Screen: NEGATIVE
Unit division: 0
Unit division: 0

## 2020-07-01 LAB — BPAM RBC
Blood Product Expiration Date: 202109112359
Blood Product Expiration Date: 202109112359
Unit Type and Rh: 6200
Unit Type and Rh: 6200

## 2020-07-02 LAB — QUANTIFERON-TB GOLD PLUS (RQFGPL)
QuantiFERON Mitogen Value: 4.1 IU/mL
QuantiFERON Nil Value: 0.04 IU/mL
QuantiFERON TB1 Ag Value: 0.33 IU/mL
QuantiFERON TB2 Ag Value: 0.43 IU/mL

## 2020-07-02 LAB — QUANTIFERON-TB GOLD PLUS: QuantiFERON-TB Gold Plus: POSITIVE — AB

## 2020-07-07 ENCOUNTER — Encounter (HOSPITAL_COMMUNITY): Payer: Self-pay | Admitting: Emergency Medicine

## 2020-07-07 ENCOUNTER — Encounter (HOSPITAL_COMMUNITY)
Admission: RE | Admit: 2020-07-07 | Discharge: 2020-07-07 | Disposition: A | Discharge status: Home / Self Care | Payer: BC Managed Care – PPO | Source: Ambulatory Visit | Attending: Nephrology | Admitting: Nephrology

## 2020-07-07 ENCOUNTER — Emergency Department (HOSPITAL_COMMUNITY)
Admission: EM | Admit: 2020-07-07 | Discharge: 2020-07-07 | Disposition: A | Discharge status: Home / Self Care | Payer: BC Managed Care – PPO | Attending: Emergency Medicine | Admitting: Emergency Medicine

## 2020-07-07 ENCOUNTER — Other Ambulatory Visit: Payer: Self-pay

## 2020-07-07 ENCOUNTER — Encounter (HOSPITAL_COMMUNITY): Admission: RE | Admit: 2020-07-07 | Payer: BC Managed Care – PPO | Source: Ambulatory Visit

## 2020-07-07 DIAGNOSIS — N184 Chronic kidney disease, stage 4 (severe): Secondary | ICD-10-CM | POA: Insufficient documentation

## 2020-07-07 DIAGNOSIS — H9202 Otalgia, left ear: Secondary | ICD-10-CM | POA: Insufficient documentation

## 2020-07-07 DIAGNOSIS — E1122 Type 2 diabetes mellitus with diabetic chronic kidney disease: Secondary | ICD-10-CM | POA: Insufficient documentation

## 2020-07-07 DIAGNOSIS — I129 Hypertensive chronic kidney disease with stage 1 through stage 4 chronic kidney disease, or unspecified chronic kidney disease: Secondary | ICD-10-CM | POA: Insufficient documentation

## 2020-07-07 NOTE — ED Triage Notes (Signed)
Pt c/o left ear pulsating pain. Pt recently seen for same with no improvement.

## 2020-07-07 NOTE — ED Provider Notes (Signed)
Emergency Department Provider Note   I have reviewed the triage vital signs and the nursing notes.   HISTORY  Chief Complaint Otalgia   HPI Omar Parker is a 74 y.o. male with PMH reviewed below presents to the ED with pain and throbbing type sensation in the left ear. Patient was seen in the ED on 08/27 with similar symptoms. He was found to have a cerumen impaction and started on Debrox with no real or lasting relief in symptoms. He denies any fever, ear drainage, sudden/severe HA, or neck pain. He has since had to go on HD that thought that starting HD may help but when it did not he decided to return to the ED.   Past Medical History:  Diagnosis Date  . Anemia    IDA  . Diabetes mellitus without complication ([place])   . GERD (gastroesophageal reflux disease)   . High cholesterol   . Hypertension   . Stage 4 chronic kidney disease [facility])     Patient Active Problem List   Diagnosis Date Noted  . Lower GI bleeding 04/09/2020  . CKD (chronic kidney disease), stage V ([place]) 04/09/2020  . Acute blood loss anemia 04/09/2020  . Anemia in chronic kidney disease (CKD) 04/09/2020  . 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 Diverticulosis 04/09/2020  . Aspirin Jakeel Starliper-term use 04/09/2020  . Uncontrolled hypertension 03/02/2020  . 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 Stage 4 chronic kidney disease ([place])   . Hypertension   . High cholesterol   . Absolute anemia 04/04/2017  . AKI (acute kidney injury) ([place]) 06/17/2016  . Dehydration 06/17/2016    Past Surgical History:  Procedure Laterality Date  . AV FISTULA PLACEMENT Left 06/28/2020   Procedure: LEFT UPPER ARM ARTERIOVENOUS (AV) GORE-TEX GRAFT;  Surgeon: Elam Dutch, MD;  Location: [place];  Service: Vascular;  Laterality: Left;  . COLONOSCOPY N/A 05/16/2017   Procedure: COLONOSCOPY;  Surgeon: Rogene Houston, MD;  [facility];  Service: Endoscopy;  Laterality: N/A;  7:30  . COLONOSCOPY  N/A 04/10/2020   Procedure: COLONOSCOPY;  Surgeon: Daneil Dolin, MD;  [facility];  Service: Endoscopy;  Laterality: N/A;  . ESOPHAGOGASTRODUODENOSCOPY N/A 04/10/2020   Procedure: ESOPHAGOGASTRODUODENOSCOPY (EGD);  Surgeon: Daneil Dolin, MD;  [facility];  Service: Endoscopy;  Laterality: N/A;  . HERNIA REPAIR    . INSERTION OF DIALYSIS CATHETER  06/28/2020   Procedure: INSERTION OF DIALYSIS CATHETER;  Surgeon: Elam Dutch, MD;  [facility];  Service: Vascular;;  . POLYPECTOMY  04/10/2020   Procedure: POLYPECTOMY;  Surgeon: Daneil Dolin, MD;  [facility];  Service: Endoscopy;;  cecal;    Allergies Ace inhibitors  Family History  Problem Relation Age of Onset  . Chronic Renal Failure Neg Hx     Social History Social History   Tobacco Use  . Smoking status: Never Smoker  . Smokeless tobacco: Never Used  Vaping Use  . Vaping Use: Never used  Substance Use Topics  . Alcohol use: Not Currently    Comment: no h/o heavy use  . Drug use: Not Currently    Types: Marijuana    Comment: none in 20 years    Review of Systems  Constitutional: No fever/chills Eyes: No visual changes. ENT: No sore throat. Throbbing sensation in the left ear. Normal hearing.  Neurological: Negative for headaches, focal weakness or numbness.  10-point ROS otherwise negative.  ____________________________________________  PHYSICAL EXAM:  VITAL SIGNS: ED Triage Vitals [07/07/20 0259]  Enc Vitals Group     BP (!) 197/79     Pulse Rate 72     Resp 16     Temp 99.1 F (37.3 C)     Temp Source Oral     SpO2 96 %     Weight 184 lb (83.5 kg)     Height 5\' 11"  (1.803 m)   Constitutional: Alert and oriented. Well appearing and in no acute distress. Eyes: Conjunctivae are normal. Head: Atraumatic. Ears:  Healthy appearing ear canals. Cerumen buildup bilaterally but worse on the asymptomatic side. TM visualized on the left without bulging or  erythema.  Nose: No congestion/rhinnorhea. Mouth/Throat: Mucous membranes are moist. Neck: No stridor. Cardiovascular: Normal rate, regular rhythm. Respiratory: Normal respiratory effort.  Gastrointestinal: No distention.  Musculoskeletal: No gross deformities of extremities. Neurologic:  Normal speech and language. Skin:  Skin is warm, dry and intact. No rash noted.  ____________________________________________   PROCEDURES  Procedure(s) performed:   Procedures  None  ____________________________________________   INITIAL IMPRESSION / ASSESSMENT AND PLAN / ED COURSE  Pertinent labs & imaging results that were available during my care of the patient were reviewed by me and considered in my medical decision making (see chart for details).   Patient with throbbing quality in the left ear. TM visible but cerumen buildup remains. Will refer to ENT and have the patient continue Debrox at home. Considered vascular pathology but no HA or neuro symptoms. Patient newly on HD so not a contrast candidate. No indication for vascular studies on an emergent basis. Gave contact info for ENT on call with instructions to call in the AM for ASAP appointment. Discussed ED return precautions.    ____________________________________________  FINAL CLINICAL IMPRESSION(S) / ED DIAGNOSES  Final diagnoses:  Left ear pain     Note:  This document was prepared using Dragon voice recognition software and may include unintentional dictation errors.  Nanda Quinton, MD, [facility]    Sadee Osland, Wonda Olds, MD 07/07/20 [phone]

## 2020-07-07 NOTE — Discharge Instructions (Signed)
Please call the ENT doctor listed for the next available appointment. You may take Tylenol as needed for any pain and can continue the eardrops prescribed during your last ED visit. Return with any severe headache, numbness, weakness, vision change, or other sudden/severe symptoms.

## 2020-07-22 ENCOUNTER — Ambulatory Visit (INDEPENDENT_AMBULATORY_CARE_PROVIDER_SITE_OTHER): Payer: Self-pay | Admitting: Physician Assistant

## 2020-07-22 ENCOUNTER — Other Ambulatory Visit: Payer: Self-pay

## 2020-07-22 ENCOUNTER — Encounter: Payer: Self-pay | Admitting: Physician Assistant

## 2020-07-22 VITALS — BP 195/80 | HR 71 | Temp 98.7°F | Resp 20 | Ht 71.0 in | Wt 178.0 lb

## 2020-07-22 DIAGNOSIS — Z992 Dependence on renal dialysis: Secondary | ICD-10-CM

## 2020-07-22 DIAGNOSIS — N186 End stage renal disease: Secondary | ICD-10-CM

## 2020-07-22 NOTE — Progress Notes (Signed)
  POST OPERATIVE OFFICE NOTE    CC:  F/u for surgery  HPI:  This is a 74 y.o. male who is s/p Placement of TDC right and left upper arm AV graft on 06/28/2020 by Dr. Oneida Alar.       Pt returns today for follow up.  He denise pain, loss of motor or sensation in the left UE.       Allergies  Allergen Reactions  . Ace Inhibitors Swelling    Current Outpatient Medications  Medication Sig Dispense Refill  . acetaminophen (TYLENOL) 325 MG tablet Take 650 mg by mouth every 6 (six) hours as needed for moderate pain.     . calcitRIOL (ROCALTROL) 0.25 MCG capsule Take 0.25 mcg by mouth every Monday, Wednesday, and Friday.     . Coenzyme Q10 (CO Q-10) 100 MG CAPS Take 100 mg by mouth daily.     Marland Kitchen diltiazem (CARDIZEM CD) 240 MG 24 hr capsule Take 240 mg by mouth daily.    Marland Kitchen epoetin alfa (EPOGEN) 3000 UNIT/ML injection Inject 6,000 Units into the vein every 14 (fourteen) days.     . furosemide (LASIX) 20 MG tablet Take 20 mg by mouth 2 (two) times daily as needed for fluid.     Marland Kitchen HYDROcodone-acetaminophen (NORCO/VICODIN) 5-325 MG tablet Take 1 tablet by mouth every 6 (six) hours as needed for moderate pain. 16 tablet 0  . insulin glargine (LANTUS) 100 UNIT/ML injection Inject 0.1 mLs (10 Units total) into the skin at bedtime. (Patient taking differently: Inject 50 Units into the skin at bedtime. ) 10 mL 11  . Magnesium Oxide 400 MG CAPS Take 1 capsule (400 mg total) by mouth daily. 30 capsule 0  . minoxidil (LONITEN) 10 MG tablet Take 10 mg by mouth daily.    . pantoprazole (PROTONIX) 40 MG tablet Take 40 mg by mouth daily.     . potassium chloride (KLOR-CON M10) 10 MEQ tablet Take 1 tablet (10 mEq total) by mouth daily. 30 tablet 0  . pravastatin (PRAVACHOL) 80 MG tablet Take 80 mg by mouth at bedtime.     Marland Kitchen spironolactone (ALDACTONE) 25 MG tablet Take 1 tablet by mouth daily.     . carvedilol (COREG) 3.125 MG tablet Take 1 tablet (3.125 mg total) by mouth 2 (two) times daily with a meal. 60 tablet  0  . hydrALAZINE (APRESOLINE) 50 MG tablet Take 1 tablet (50 mg total) by mouth every 8 (eight) hours. 90 tablet 0   No current facility-administered medications for this visit.     ROS:  See HPI  Physical Exam:    Incision:  Well healed incisions Extremities:  Grip 5/5, palpable thrill in graft and radial pulse in the left  lung: non labored breathing   Assessment/Plan:  This is a 74 y.o. male who is s/p:right TDC and left UE AV graft placement.    Asymptomatic for steal.  The graft may be accessed as of 07/29/2020.  Once it has been used successfully he will be scheduled for [facility] removal.  F/U PRN.  Roxy Horseman PA-C [facility] [phone]  Clinic MD:  Oneida Alar

## 2020-09-15 ENCOUNTER — Other Ambulatory Visit (HOSPITAL_COMMUNITY): Payer: Self-pay | Admitting: Nephrology

## 2020-09-15 DIAGNOSIS — N186 End stage renal disease: Secondary | ICD-10-CM

## 2020-09-21 ENCOUNTER — Ambulatory Visit (HOSPITAL_COMMUNITY)
Admission: RE | Admit: 2020-09-21 | Discharge: 2020-09-21 | Disposition: A | Discharge status: Home / Self Care | Payer: Medicare Other | Source: Ambulatory Visit | Attending: Nephrology | Admitting: Nephrology

## 2020-09-21 ENCOUNTER — Other Ambulatory Visit: Payer: Self-pay

## 2020-09-21 DIAGNOSIS — N186 End stage renal disease: Secondary | ICD-10-CM | POA: Diagnosis not present

## 2020-09-21 DIAGNOSIS — Z4901 Encounter for fitting and adjustment of extracorporeal dialysis catheter: Secondary | ICD-10-CM | POA: Insufficient documentation

## 2020-09-21 HISTORY — PX: IR REMOVAL TUN CV CATH W/O FL: IMG2289

## 2020-09-21 MED ORDER — LIDOCAINE HCL 1 % IJ SOLN
INTRAMUSCULAR | Status: DC | PRN
  Administered 2020-09-21: 10 mL

## 2020-09-21 MED ORDER — LIDOCAINE HCL 1 % IJ SOLN
INTRAMUSCULAR | Status: AC
  Filled 2020-09-21: qty 20

## 2020-09-21 NOTE — Procedures (Signed)
Pre procedural Dx: ESRD Post procedural Dx: Same  Successful removal of tunneled HD catheter. Catheter removed intact   EBL: None No immediate complications.  Please see imaging section of Epic for full dictation.  Jacqualine Mau NP 09/21/2020 10:04 AM

## 2021-05-02 IMAGING — DX DG CHEST 1V PORT
1 series · 1 of 1 positions shown · non-contrast
Comparison: March 23, 2008

CLINICAL DATA: Chest pain

EXAM:
PORTABLE CHEST 1 VIEW

[chest ap]
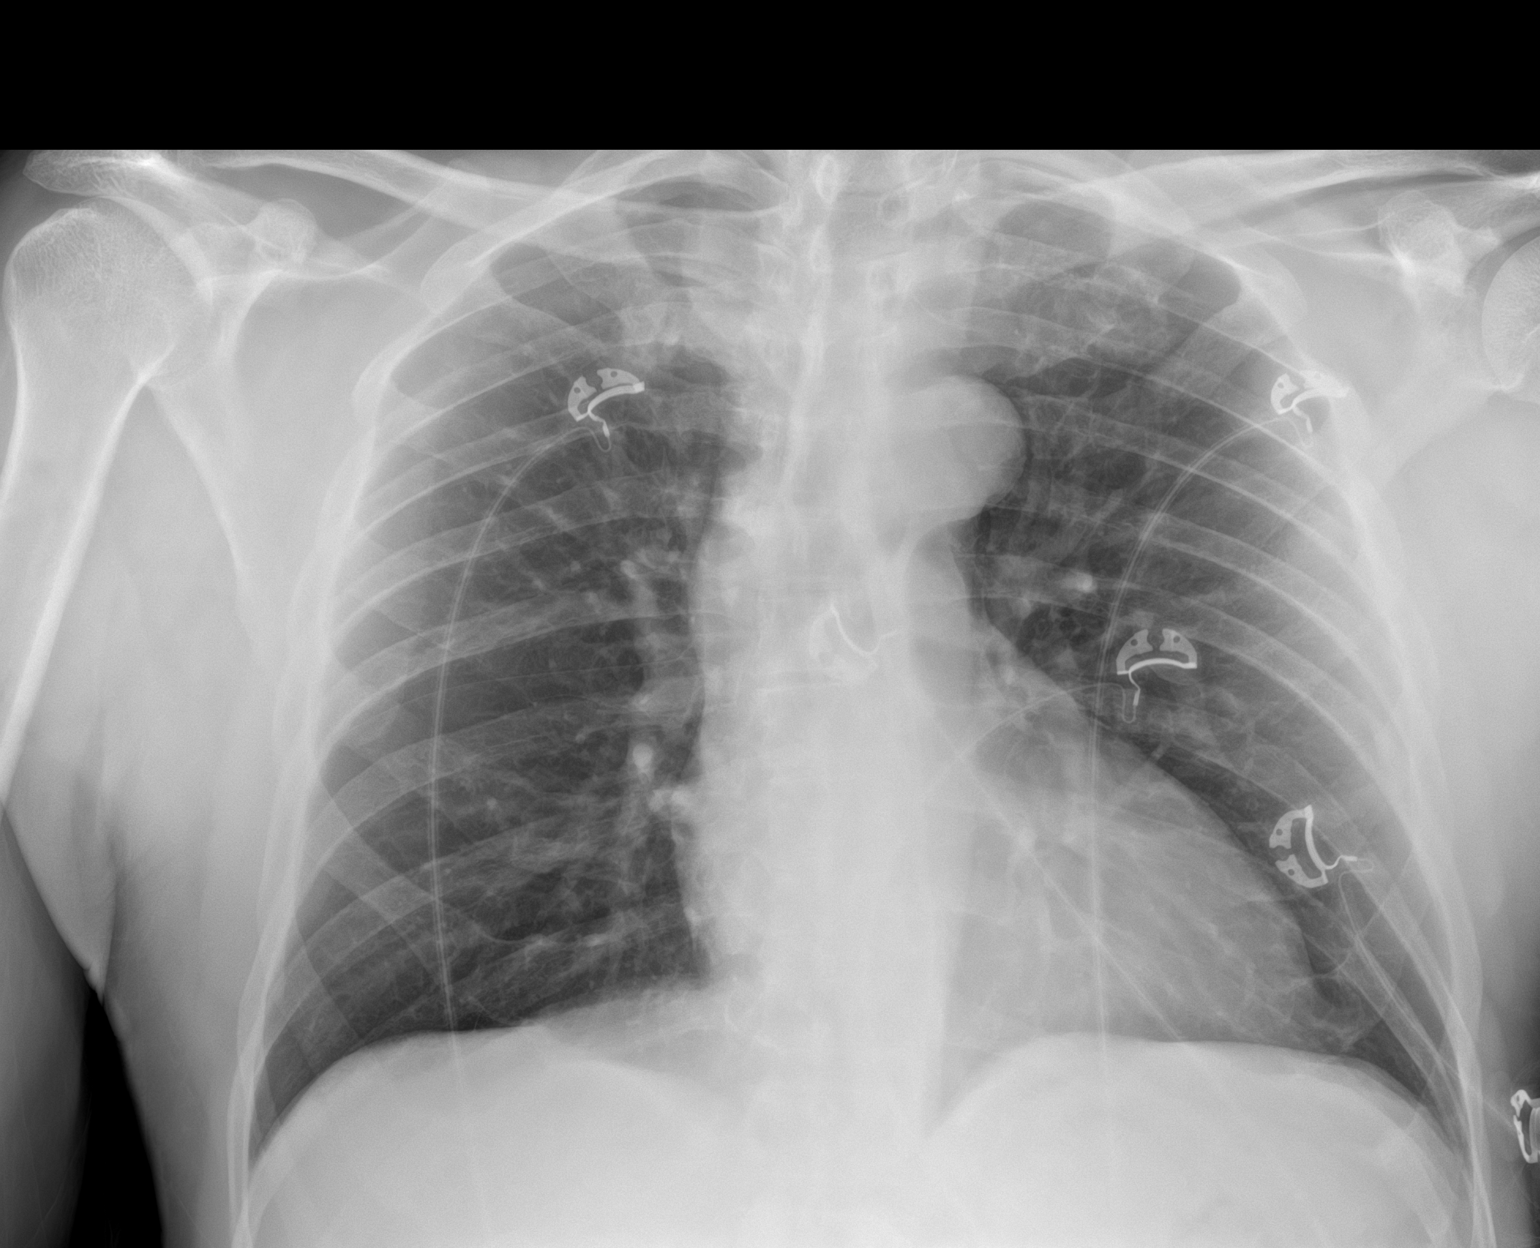

[1 of 1 positions shown; findings below may reference images not displayed]

FINDINGS: Lungs are clear. Heart is upper normal in size with pulmonary
vascularity normal. No adenopathy. Mild fullness in the right
paratracheal region is felt to be due to great vessel prominence. No
pneumothorax. No bone lesions.
IMPRESSION: Lungs clear.  Heart upper normal in size.  No adenopathy.

## 2022-04-06 ENCOUNTER — Emergency Department (HOSPITAL_COMMUNITY): Payer: Medicare PPO

## 2022-04-06 ENCOUNTER — Encounter (HOSPITAL_COMMUNITY): Payer: Self-pay

## 2022-04-06 ENCOUNTER — Other Ambulatory Visit: Payer: Self-pay

## 2022-04-06 ENCOUNTER — Observation Stay (HOSPITAL_COMMUNITY)
Admission: EM | Admit: 2022-04-06 | Discharge: 2022-04-07 | Disposition: A | Discharge status: Home / Self Care | Payer: Medicare PPO | Attending: Family Medicine | Admitting: Family Medicine

## 2022-04-06 DIAGNOSIS — Z79899 Other long term (current) drug therapy: Secondary | ICD-10-CM | POA: Diagnosis not present

## 2022-04-06 DIAGNOSIS — Z794 Long term (current) use of insulin: Secondary | ICD-10-CM | POA: Insufficient documentation

## 2022-04-06 DIAGNOSIS — E785 Hyperlipidemia, unspecified: Secondary | ICD-10-CM | POA: Insufficient documentation

## 2022-04-06 DIAGNOSIS — I10 Essential (primary) hypertension: Secondary | ICD-10-CM | POA: Diagnosis present

## 2022-04-06 DIAGNOSIS — N186 End stage renal disease: Secondary | ICD-10-CM

## 2022-04-06 DIAGNOSIS — J320 Chronic maxillary sinusitis: Secondary | ICD-10-CM | POA: Diagnosis not present

## 2022-04-06 DIAGNOSIS — I639 Cerebral infarction, unspecified: Secondary | ICD-10-CM | POA: Diagnosis present

## 2022-04-06 DIAGNOSIS — K5790 Diverticulosis of intestine, part unspecified, without perforation or abscess without bleeding: Secondary | ICD-10-CM | POA: Diagnosis present

## 2022-04-06 DIAGNOSIS — R42 Dizziness and giddiness: Secondary | ICD-10-CM | POA: Diagnosis not present

## 2022-04-06 DIAGNOSIS — N185 Chronic kidney disease, stage 5: Secondary | ICD-10-CM | POA: Diagnosis not present

## 2022-04-06 DIAGNOSIS — E1122 Type 2 diabetes mellitus with diabetic chronic kidney disease: Secondary | ICD-10-CM | POA: Diagnosis not present

## 2022-04-06 DIAGNOSIS — I63233 Cerebral infarction due to unspecified occlusion or stenosis of bilateral carotid arteries: Secondary | ICD-10-CM | POA: Diagnosis not present

## 2022-04-06 DIAGNOSIS — R29818 Other symptoms and signs involving the nervous system: Secondary | ICD-10-CM

## 2022-04-06 DIAGNOSIS — E7800 Pure hypercholesterolemia, unspecified: Secondary | ICD-10-CM | POA: Diagnosis not present

## 2022-04-06 DIAGNOSIS — I120 Hypertensive chronic kidney disease with stage 5 chronic kidney disease or end stage renal disease: Secondary | ICD-10-CM | POA: Diagnosis not present

## 2022-04-06 DIAGNOSIS — R569 Unspecified convulsions: Secondary | ICD-10-CM

## 2022-04-06 DIAGNOSIS — Z992 Dependence on renal dialysis: Secondary | ICD-10-CM | POA: Insufficient documentation

## 2022-04-06 DIAGNOSIS — G459 Transient cerebral ischemic attack, unspecified: Secondary | ICD-10-CM | POA: Diagnosis not present

## 2022-04-06 DIAGNOSIS — E1129 Type 2 diabetes mellitus with other diabetic kidney complication: Secondary | ICD-10-CM | POA: Diagnosis present

## 2022-04-06 LAB — CBC
HCT: 41.7 % (ref 39.0–52.0)
Hemoglobin: 12.9 g/dL — ABNORMAL LOW (ref 13.0–17.0)
MCH: 26 pg (ref 26.0–34.0)
MCHC: 30.9 g/dL (ref 30.0–36.0)
MCV: 84.1 fL (ref 80.0–100.0)
Platelets: 290 10*3/uL (ref 150–400)
RBC: 4.96 MIL/uL (ref 4.22–5.81)
RDW: 15.8 % — ABNORMAL HIGH (ref 11.5–15.5)
WBC: 8.1 10*3/uL (ref 4.0–10.5)
nRBC: 0 % (ref 0.0–0.2)

## 2022-04-06 LAB — BASIC METABOLIC PANEL
Anion gap: 13 (ref 5–15)
BUN: 22 mg/dL (ref 8–23)
CO2: 27 mmol/L (ref 22–32)
Calcium: 8.7 mg/dL — ABNORMAL LOW (ref 8.9–10.3)
Chloride: 97 mmol/L — ABNORMAL LOW (ref 98–111)
Creatinine, Ser: 3.02 mg/dL — ABNORMAL HIGH (ref 0.61–1.24)
GFR, Estimated: 21 mL/min — ABNORMAL LOW (ref >60)
Glucose, Bld: 185 mg/dL — ABNORMAL HIGH (ref 70–99)
Potassium: 3.7 mmol/L (ref 3.5–5.1)
Sodium: 137 mmol/L (ref 135–145)

## 2022-04-06 LAB — HEMOGLOBIN A1C
Hgb A1c MFr Bld: 7 % — ABNORMAL HIGH (ref 4.8–5.6)
Mean Plasma Glucose: 154.2 mg/dL

## 2022-04-06 LAB — GLUCOSE, CAPILLARY: Glucose-Capillary: 167 mg/dL — ABNORMAL HIGH (ref 70–99)

## 2022-04-06 LAB — CBG MONITORING, ED: Glucose-Capillary: 189 mg/dL — ABNORMAL HIGH (ref 70–99)

## 2022-04-06 MED ORDER — INSULIN GLARGINE-YFGN 100 UNIT/ML ~~LOC~~ SOLN
6.0000 [IU] | Freq: Two times a day (BID) | SUBCUTANEOUS | Status: DC
  Administered 2022-04-06 – 2022-04-07 (×2): 6 [IU] via SUBCUTANEOUS
  Filled 2022-04-06 (×4): qty 0.06

## 2022-04-06 MED ORDER — ACETAMINOPHEN 325 MG PO TABS: 650.0000 mg | ORAL_TABLET | Freq: Four times a day (QID) | ORAL | Status: DC | PRN

## 2022-04-06 MED ORDER — PANTOPRAZOLE SODIUM 40 MG PO TBEC
40.0000 mg | DELAYED_RELEASE_TABLET | Freq: Two times a day (BID) | ORAL | Status: DC
Start: 2022-04-06 — End: 2022-04-06

## 2022-04-06 MED ORDER — HEPARIN SODIUM (PORCINE) 5000 UNIT/ML IJ SOLN
5000.0000 [IU] | Freq: Three times a day (TID) | INTRAMUSCULAR | Status: DC
  Administered 2022-04-06 – 2022-04-07 (×2): 5000 [IU] via SUBCUTANEOUS
  Filled 2022-04-06 (×3): qty 1

## 2022-04-06 MED ORDER — STROKE: EARLY STAGES OF RECOVERY BOOK
Freq: Once | Status: AC
Start: 2022-04-06 — End: 2022-04-06
  Filled 2022-04-06: qty 1

## 2022-04-06 MED ORDER — INSULIN ASPART 100 UNIT/ML IJ SOLN
0.0000 [IU] | Freq: Three times a day (TID) | INTRAMUSCULAR | Status: DC
  Administered 2022-04-07 (×2): 1 [IU] via SUBCUTANEOUS

## 2022-04-06 MED ORDER — PANTOPRAZOLE SODIUM 40 MG PO TBEC
40.0000 mg | DELAYED_RELEASE_TABLET | Freq: Two times a day (BID) | ORAL | Status: DC
  Administered 2022-04-06 – 2022-04-07 (×2): 40 mg via ORAL
  Filled 2022-04-06 (×2): qty 1

## 2022-04-06 MED ORDER — CLOPIDOGREL BISULFATE 75 MG PO TABS
75.0000 mg | ORAL_TABLET | Freq: Every day | ORAL | Status: DC
  Administered 2022-04-06 – 2022-04-07 (×2): 75 mg via ORAL
  Filled 2022-04-06 (×2): qty 1

## 2022-04-06 MED ORDER — INSULIN ASPART 100 UNIT/ML IJ SOLN: 0.0000 [IU] | Freq: Every day | INTRAMUSCULAR | Status: DC

## 2022-04-06 MED ORDER — ATORVASTATIN CALCIUM 40 MG PO TABS
80.0000 mg | ORAL_TABLET | Freq: Every day | ORAL | Status: DC
  Administered 2022-04-06: 80 mg via ORAL
  Filled 2022-04-06: qty 2

## 2022-04-06 MED ORDER — ASPIRIN 81 MG PO TBEC
81.0000 mg | DELAYED_RELEASE_TABLET | Freq: Every day | ORAL | Status: DC
  Administered 2022-04-07: 81 mg via ORAL
  Filled 2022-04-06: qty 1

## 2022-04-06 MED ORDER — SENNOSIDES-DOCUSATE SODIUM 8.6-50 MG PO TABS: 1.0000 | ORAL_TABLET | Freq: Every evening | ORAL | Status: DC | PRN

## 2022-04-06 MED ORDER — ASPIRIN 325 MG PO TABS
325.0000 mg | ORAL_TABLET | Freq: Once | ORAL | Status: AC
  Administered 2022-04-06: 325 mg via ORAL
  Filled 2022-04-06: qty 1

## 2022-04-06 NOTE — H&P (Signed)
TRH H&P   Patient Demographics:    Omar Parker, is a 76 y.o. male  MRN: [id number]   DOB - January 31, 1946  Admit Date - 04/06/2022  Outpatient Primary MD for the patient is Redmond School, MD  Referring MD/NP/PA: Dr Alvino Chapel  Patient coming from: home  Chief Complaint  Patient presents with   Dizziness      HPI:    Omar Parker  is a 76 y.o. male, past medical history of ESRD, on HD, diabetes mellitus, insulin-dependent, with renal complication, anemia of chronic kidney disease, diverticulosis, history of GI bleed and 2021 endoscopy significant for erosive gastropathy, colonoscopy significant for reticulosis in the sigmoid and descending colon, and polyps which were removed. -Yesterday secondary to complaints of dizziness, reports symptoms started yesterday around noontime, report he was watching TV, he felt unsteadiness, and he felt vertigo with things were moving around him, denies any loss of consciousness, altered mental status, fever, chills, reports tingling and numbness in the right arm, he denies any history of vertigo in the past, is known ESRD patient, on TTS schedule, was dialyzed today with no issues.. - in ED blood pressure uncontrolled, 193/85, second for creatinine of 3.02, potassium 3.7, hemoglobin stable at 12.9, CT head with no acute findings, reading of brain MRI was called to ED physician of acute stroke, full dose aspirin has been ordered, [facility] hospitalist consulted to admit.   Review of systems:     A full 10 point Review of Systems was done, except as stated above, all other Review of Systems were negative.   With Past History of the following :    Past Medical History:  Diagnosis Date   Anemia    IDA   Diabetes mellitus without complication (HCC)    GERD (gastroesophageal reflux disease)    High cholesterol    Hypertension    Stage 4 chronic kidney  disease (HCC)       Past Surgical History:  Procedure Laterality Date   AV FISTULA PLACEMENT Left 06/28/2020   Procedure: LEFT UPPER ARM ARTERIOVENOUS (AV) GORE-TEX GRAFT;  Surgeon: Elam Dutch, MD;  Location: [place];  Service: Vascular;  Laterality: Left;   COLONOSCOPY N/A 05/16/2017   Procedure: COLONOSCOPY;  Surgeon: Rogene Houston, MD;  [facility];  Service: Endoscopy;  Laterality: N/A;  7:30   COLONOSCOPY N/A 04/10/2020   Procedure: COLONOSCOPY;  Surgeon: Daneil Dolin, MD;  [facility];  Service: Endoscopy;  Laterality: N/A;   ESOPHAGOGASTRODUODENOSCOPY N/A 04/10/2020   Procedure: ESOPHAGOGASTRODUODENOSCOPY (EGD);  Surgeon: Daneil Dolin, MD;  [facility];  Service: Endoscopy;  Laterality: N/A;   HERNIA REPAIR     INSERTION OF DIALYSIS CATHETER  06/28/2020   Procedure: INSERTION OF DIALYSIS CATHETER;  Surgeon: Elam Dutch, MD;  Location[facility];  Service: Vascular;;   IR REMOVAL TUN CV CATH W/O FL  09/21/2020   POLYPECTOMY  04/10/2020   Procedure: POLYPECTOMY;  Surgeon: Daneil Dolin, MD;  [facility];  Service: Endoscopy;;  cecal;      Social History:     Social History   Tobacco Use   Smoking status: Never   Smokeless tobacco: Never  Substance Use Topics   Alcohol use: Not Currently    Comment: no h/o heavy use       Family History :     Family History  Problem Relation Age of Onset   Chronic Renal Failure Neg Hx       Home Medications:   Prior to Admission medications   Medication Sig Start Date End Date Taking? Authorizing Provider  acetaminophen (TYLENOL) 325 MG tablet Take 650 mg by mouth every 6 (six) hours as needed for moderate pain.    Yes [provider]  carvedilol (COREG) 3.125 MG tablet Take 1 tablet (3.125 mg total) by mouth 2 (two) times daily with a meal. Patient taking differently: Take 6.25 mg by mouth 2 (two) times daily with a meal. 04/11/20 04/06/22 Yes Johnson, Clanford  L, MD  diltiazem (CARDIZEM CD) 240 MG 24 hr capsule Take 240 mg by mouth 2 (two) times daily. 04/12/20  Yes [provider]  furosemide (LASIX) 20 MG tablet Take 20 mg by mouth daily.   Yes [provider]  hydrALAZINE (APRESOLINE) 50 MG tablet Take 1 tablet (50 mg total) by mouth every 8 (eight) hours. Patient taking differently: Take 150 mg by mouth 2 (two) times daily. 04/10/20 04/06/22 Yes Johnson, Clanford L, MD  insulin glargine (LANTUS) 100 UNIT/ML injection Inject 0.1 mLs (10 Units total) into the skin at bedtime. Patient taking differently: Inject 6 Units into the skin 2 (two) times daily. 04/12/20  Yes Johnson, Clanford L, MD  pantoprazole (PROTONIX) 40 MG tablet Take 40 mg by mouth daily.    Yes [provider]  HYDROcodone-acetaminophen (NORCO/VICODIN) 5-325 MG tablet Take 1 tablet by mouth every 6 (six) hours as needed for moderate pain. Patient not taking: Reported on 04/06/2022 06/28/20   Gabriel Earing, PA-C  Magnesium Oxide 400 MG CAPS Take 1 capsule (400 mg total) by mouth daily. Patient not taking: Reported on 04/06/2022 03/03/20   Geradine Girt, DO  potassium chloride (KLOR-CON M10) 10 MEQ tablet Take 1 tablet (10 mEq total) by mouth daily. Patient not taking: Reported on 04/06/2022 03/03/20   Geradine Girt, DO     Allergies:     Allergies  Allergen Reactions   Ace Inhibitors Swelling     Physical Exam:   Vitals  Blood pressure (!) 199/85, pulse 65, temperature 97.6 F (36.4 C), temperature source Oral, resp. rate 18, height 5\' 11"  (1.803 m), weight 79.4 kg, SpO2 99 %.   1. General *male, laying in bed, no apparent distress  2. Normal affect and insight, Not Suicidal or Homicidal, Awake Alert, Oriented X 3.  3.  Slightly abnormal finger-to-nose test on the right, heel-to-shin intact bilaterally, Romberg negative, sensation intact bilaterally, having was diagnosed with history of the right  4. Ears and Eyes appear Normal, Conjunctivae clear.  Moist Oral Mucosa.  5. Supple Neck, No JVD, No cervical lymphadenopathy appriciated, No Carotid Bruits.  6. Symmetrical Chest wall movement, Good air movement bilaterally, CTAB.  7. RRR, No Gallops, Rubs or Murmurs, No Parasternal Heave.  8. Positive Bowel Sounds, Abdomen Soft, No tenderness, No organomegaly appriciated,No rebound -guarding or rigidity.  9.  No Cyanosis, Normal  Skin Turgor, No Skin Rash or Bruise.  10. Good muscle tone,  joints appear normal , no effusions, Normal ROM.  11. No Palpable Lymph Nodes in Neck or Axillae     Data Review:    CBC Recent Labs  Lab 04/06/22 1111  WBC 8.1  HGB 12.9*  HCT 41.7  PLT 290  MCV 84.1  MCH 26.0  MCHC 30.9  RDW 15.8*   ------------------------------------------------------------------------------------------------------------------  Chemistries  Recent Labs  Lab 04/06/22 1111  NA 137  K 3.7  CL 97*  CO2 27  GLUCOSE 185*  BUN 22  CREATININE 3.02*  CALCIUM 8.7*   ------------------------------------------------------------------------------------------------------------------ estimated creatinine clearance is 22.9 mL/min (A) (by C-G formula based on SCr of 3.02 mg/dL (H)). ------------------------------------------------------------------------------------------------------------------ No results for input(s): "TSH", "T4TOTAL", "T3FREE", "THYROIDAB" in the last 72 hours.  Invalid input(s): "FREET3"  Coagulation profile No results for input(s): "INR", "PROTIME" in the last 168 hours. ------------------------------------------------------------------------------------------------------------------- No results for input(s): "DDIMER" in the last 72 hours. -------------------------------------------------------------------------------------------------------------------  Cardiac Enzymes No results for input(s): "CKMB", "TROPONINI", "MYOGLOBIN" in the last 168 hours.  Invalid input(s):  "CK" ------------------------------------------------------------------------------------------------------------------ No results found for: "BNP"   ---------------------------------------------------------------------------------------------------------------  Urinalysis    Component Value Date/Time   COLORURINE YELLOW 01/03/2020 1846   APPEARANCEUR CLEAR 01/03/2020 1846   LABSPEC 1.017 01/03/2020 1846   PHURINE 6.0 01/03/2020 1846   GLUCOSEU 50 (A) 01/03/2020 1846   HGBUR NEGATIVE 01/03/2020 1846   BILIRUBINUR NEGATIVE 01/03/2020 1846   KETONESUR NEGATIVE 01/03/2020 1846   PROTEINUR >=300 (A) 01/03/2020 1846   NITRITE NEGATIVE 01/03/2020 1846   LEUKOCYTESUR NEGATIVE 01/03/2020 1846    ----------------------------------------------------------------------------------------------------------------   Imaging Results:    CT Head Wo Contrast  Result Date: 04/06/2022 CLINICAL DATA:  Vertigo, central Neuro deficit, acute, stroke suspected EXAM: CT HEAD WITHOUT CONTRAST TECHNIQUE: Contiguous axial images were obtained from the base of the skull through the vertex without intravenous contrast. RADIATION DOSE REDUCTION: This exam was performed according to the departmental dose-optimization program which includes automated exposure control, adjustment of the mA and/or kV according to patient size and/or use of iterative reconstruction technique. COMPARISON:  None Available. FINDINGS: Brain: There is no acute intracranial hemorrhage, mass effect, or edema. Gray-white differentiation is preserved. There is no extra-axial fluid collection. Ventricles and sulci are within normal limits in size and configuration. Patchy low-density in the supratentorial white matter is nonspecific but may reflect minor chronic microvascular ischemic changes. Vascular: There is atherosclerotic calcification at the skull base. Skull: Calvarium is unremarkable. Sinuses/Orbits: Partially imaged chronic left maxillary  sinusitis. Orbits are unremarkable. Other: None. IMPRESSION: No acute intracranial abnormality. Electronically Signed   By: Macy Mis M.D.   On: 04/06/2022 12:52    My personal review of EKG: Rhythm NSR, Rate  64 /min, QTc 483   Assessment & Plan:    Principal Problem:   TIA (transient ischemic attack) Active Problems:   Focal neurological deficit   Hypertension   High cholesterol   Type 2 diabetes mellitus with renal complication (HCC)   Diverticulosis   ESRD on dialysis ([place])   Vertigo   Acute CVA -Patient presents with symptoms lasting foR more than 24 hours. -Initial reading by radiologist for acute CVA on MRI, await final readings. -Given he is recent ESRD, will avoid CTA head and neck, will obtain head/neck WITHOUT contrast -Will give full dose aspirin, then continue aspirin 81 mg oral daily, will await further recommendation by neurology. -Admitted under CVA pathway, will consult PT/OT/SLP. -Obtain 2D echo. -Allow  for permissive hypertension -Check A1c and lipid panel.  Addendum: MRI final reading came for left pontine small CVA, with right vertebral artery occlusion(likely unrelated to this acute CVA), I have discussed this findings with neurology on-call Dr. Livia Snellen, initial recommendation for aspirin and Plavix, likely will need 90 days, and allow for permissive hypertension, and to place routine consult for neurology tomorrow.    ESRD -He is on TTS schedule, was dialyzed today, no indication for immediate dialysis, will have rounding team health renal fetus anticipated to remain till Saturday.  Diabetes mellitus -We will continue Semglee 6 units twice daily, will add insulin sliding scale as well, will check A1c  Hypertension -Allow for permissive hypertension and hold Coreg and Cardizem  GERD -Continue with PPI, which will increase to twice daily as started aspirin(3 GI bleed in the past).  Hyperlipidemia -Patient carries a history of hyperlipidemia, but  does not appear to be on any statin, will obtain lipid panel and start on lipitor 80 mg daily  Cerumen impaction -Was irrigated in the ED with some cerumen removed, he can follow-up with ENT as an outpatient  DVT Prophylaxis Heparin   AM Labs Ordered, also please review Full Orders  Family Communication: Admission, patients condition and plan of care including tests being ordered have been discussed with the patient who indicate understanding and agree with the plan and Code Status.  Code Status Full  Likely DC to  home  Condition GUARDED    Consults called: neurology requested in EPIC    Admission status: inpatient    Time spent in minutes : 70 minutes   Phillips Climes M.D on 04/06/2022 at 3:38 PM   [facility] - Office  [phone]

## 2022-04-06 NOTE — ED Provider Notes (Signed)
[facility] EMERGENCY DEPARTMENT Provider Note   CSN: [id number] Arrival date & time: 04/06/22  1033     History  Chief Complaint  Patient presents with   Dizziness    DICK HARK is a 76 y.o. male.   Dizziness Patient presents with dizziness.  States began yesterday at around noon.  States that he was watching TV got up and felt like his off balance.  States he felt things were moving around.  No headache.  No confusion.  States the right arm also feels a little numb.  No chest pain.  No trouble breathing.  No headache.  Has not had issues like this before.  History of end-stage renal disease on dialysis and was dialyzed today without difficulty.  No trauma.  Has not had episodes like this before.     Home Medications Prior to Admission medications   Medication Sig Start Date End Date Taking? Authorizing Provider  acetaminophen (TYLENOL) 325 MG tablet Take 650 mg by mouth every 6 (six) hours as needed for moderate pain.    Yes [provider]  carvedilol (COREG) 3.125 MG tablet Take 1 tablet (3.125 mg total) by mouth 2 (two) times daily with a meal. Patient taking differently: Take 6.25 mg by mouth 2 (two) times daily with a meal. 04/11/20 04/06/22 Yes Johnson, Clanford L, MD  diltiazem (CARDIZEM CD) 240 MG 24 hr capsule Take 240 mg by mouth 2 (two) times daily. 04/12/20  Yes [provider]  furosemide (LASIX) 20 MG tablet Take 20 mg by mouth daily.   Yes [provider]  hydrALAZINE (APRESOLINE) 50 MG tablet Take 1 tablet (50 mg total) by mouth every 8 (eight) hours. Patient taking differently: Take 150 mg by mouth 2 (two) times daily. 04/10/20 04/06/22 Yes Johnson, Clanford L, MD  insulin glargine (LANTUS) 100 UNIT/ML injection Inject 0.1 mLs (10 Units total) into the skin at bedtime. Patient taking differently: Inject 6 Units into the skin 2 (two) times daily. 04/12/20  Yes Johnson, Clanford L, MD  pantoprazole (PROTONIX) 40 MG tablet Take 40 mg by mouth  daily.    Yes [provider]  HYDROcodone-acetaminophen (NORCO/VICODIN) 5-325 MG tablet Take 1 tablet by mouth every 6 (six) hours as needed for moderate pain. Patient not taking: Reported on 04/06/2022 06/28/20   Gabriel Earing, PA-C  Magnesium Oxide 400 MG CAPS Take 1 capsule (400 mg total) by mouth daily. Patient not taking: Reported on 04/06/2022 03/03/20   Geradine Girt, DO  potassium chloride (KLOR-CON M10) 10 MEQ tablet Take 1 tablet (10 mEq total) by mouth daily. Patient not taking: Reported on 04/06/2022 03/03/20   Geradine Girt, DO      Allergies    Ace inhibitors    Review of Systems   Review of Systems  Neurological:  Positive for dizziness.    Physical Exam Updated Vital Signs BP (!) 189/81   Pulse 63   Temp 97.6 F (36.4 C) (Oral)   Resp 12   Ht 5\' 11"  (1.803 m)   Wt 79.4 kg   SpO2 100%   BMI 24.41 kg/m  Physical Exam Vitals and nursing note reviewed.  HENT:     Head: Normocephalic.     Left Ear: Tympanic membrane normal.     Ears:     Comments: Right TM obscured by cerumen. Eyes:     Pupils: Pupils are equal, round, and reactive to light.  Cardiovascular:     Rate and Rhythm: Regular rhythm.  Pulmonary:     Breath sounds: Normal breath sounds.  Abdominal:     Tenderness: There is no abdominal tenderness.  Musculoskeletal:     Comments: Dressing over dialysis access point on left upper arm.  Neurological:     Mental Status: He is alert.     Comments: Eye movements intact but does have nystagmus with gaze to the right.  Conjugate gaze.  Finger-nose may be slightly off on the right but good grip strength.  Heel shin intact bilaterally.  No Romberg. Sensation grossly intact in upper extremities although states it feels a little funny such as tingling on the right upper extremity.     ED Results / Procedures / Treatments   Labs (all labs ordered are listed, but only abnormal results are displayed) Labs Reviewed  BASIC METABOLIC PANEL - Abnormal;  Notable for the following components:      Result Value   Chloride 97 (*)    Glucose, Bld 185 (*)    Creatinine, Ser 3.02 (*)    Calcium 8.7 (*)    GFR, Estimated 21 (*)    All other components within normal limits  CBC - Abnormal; Notable for the following components:   Hemoglobin 12.9 (*)    RDW 15.8 (*)    All other components within normal limits  CBG MONITORING, ED - Abnormal; Notable for the following components:   Glucose-Capillary 189 (*)    All other components within normal limits  URINALYSIS, ROUTINE W REFLEX MICROSCOPIC  CBG MONITORING, ED    EKG EKG Interpretation  Date/Time:  Thursday April 06 2022 12:14:09 EDT Ventricular Rate:  64 PR Interval:  198 QRS Duration: 105 QT Interval:  468 QTC Calculation: 483 R Axis:   40 Text Interpretation: Sinus rhythm Probable left atrial enlargement Anteroseptal infarct, old Confirmed by Davonna Belling [phone]) on 04/06/2022 2:58:24 PM  Radiology CT Head Wo Contrast  Result Date: 04/06/2022 CLINICAL DATA:  Vertigo, central Neuro deficit, acute, stroke suspected EXAM: CT HEAD WITHOUT CONTRAST TECHNIQUE: Contiguous axial images were obtained from the base of the skull through the vertex without intravenous contrast. RADIATION DOSE REDUCTION: This exam was performed according to the departmental dose-optimization program which includes automated exposure control, adjustment of the mA and/or kV according to patient size and/or use of iterative reconstruction technique. COMPARISON:  None Available. FINDINGS: Brain: There is no acute intracranial hemorrhage, mass effect, or edema. Gray-white differentiation is preserved. There is no extra-axial fluid collection. Ventricles and sulci are within normal limits in size and configuration. Patchy low-density in the supratentorial white matter is nonspecific but may reflect minor chronic microvascular ischemic changes. Vascular: There is atherosclerotic calcification at the skull base. Skull:  Calvarium is unremarkable. Sinuses/Orbits: Partially imaged chronic left maxillary sinusitis. Orbits are unremarkable. Other: None. IMPRESSION: No acute intracranial abnormality. Electronically Signed   By: Macy Mis M.D.   On: 04/06/2022 12:52    Procedures Procedures    Medications Ordered in ED Medications - No data to display  ED Course/ Medical Decision Making/ A&P                           Medical Decision Making Amount and/or Complexity of Data Reviewed Labs: ordered. Radiology: ordered.  Patient presented with dizziness and numbness/tingling on right arm.  Began yesterday.  Not a tPA candidate due to time of onset.  Does have potentially focal findings and that there is nystagmus with gaze to the right and finger-nose  may be a little off on the right.  However right TM is obscured.  Will remove the cerumen and see if this helps with the symptoms but does need somewhat of a stroke rule out particular since there are focal tingling on the right upper extremity also.  Head CT independently interpreted and reassuring.  However with continued dizziness and particularly also right arm findings will require admission to the hospital for stroke work-up.  MRI has been ordered but will discuss with hospitalist.  Has had irrigation of the ear with some removal of cerumen but still feeling dizzy         Final Clinical Impression(s) / ED Diagnoses Final diagnoses:  Vertigo  End stage renal disease on dialysis [facility])    Rx / [place] Orders ED Discharge Orders     None         Davonna Belling, MD 04/06/22 1506

## 2022-04-06 NOTE — ED Notes (Signed)
Impacted cerum from right ear irrigated at this time.

## 2022-04-06 NOTE — ED Notes (Signed)
Pt attempted a  urine sample at this time with no success.

## 2022-04-06 NOTE — ED Triage Notes (Signed)
Pt presents to ED with complaints of dizziness and left arm numbness. LKW 6/7 1200. Pt states he was watching TV, got up to use the BR and felt like he was off balance.

## 2022-04-07 ENCOUNTER — Observation Stay (HOSPITAL_BASED_OUTPATIENT_CLINIC_OR_DEPARTMENT_OTHER): Payer: Medicare PPO

## 2022-04-07 DIAGNOSIS — I120 Hypertensive chronic kidney disease with stage 5 chronic kidney disease or end stage renal disease: Secondary | ICD-10-CM | POA: Diagnosis not present

## 2022-04-07 DIAGNOSIS — Z79899 Other long term (current) drug therapy: Secondary | ICD-10-CM | POA: Diagnosis not present

## 2022-04-07 DIAGNOSIS — E7800 Pure hypercholesterolemia, unspecified: Secondary | ICD-10-CM | POA: Diagnosis not present

## 2022-04-07 DIAGNOSIS — I6389 Other cerebral infarction: Secondary | ICD-10-CM

## 2022-04-07 DIAGNOSIS — N250 Renal osteodystrophy: Secondary | ICD-10-CM | POA: Diagnosis not present

## 2022-04-07 DIAGNOSIS — D631 Anemia in chronic kidney disease: Secondary | ICD-10-CM | POA: Diagnosis not present

## 2022-04-07 DIAGNOSIS — K5790 Diverticulosis of intestine, part unspecified, without perforation or abscess without bleeding: Secondary | ICD-10-CM

## 2022-04-07 DIAGNOSIS — G459 Transient cerebral ischemic attack, unspecified: Secondary | ICD-10-CM | POA: Diagnosis not present

## 2022-04-07 DIAGNOSIS — E785 Hyperlipidemia, unspecified: Secondary | ICD-10-CM | POA: Diagnosis not present

## 2022-04-07 DIAGNOSIS — R29818 Other symptoms and signs involving the nervous system: Secondary | ICD-10-CM

## 2022-04-07 DIAGNOSIS — I639 Cerebral infarction, unspecified: Secondary | ICD-10-CM | POA: Diagnosis not present

## 2022-04-07 DIAGNOSIS — Z992 Dependence on renal dialysis: Secondary | ICD-10-CM | POA: Diagnosis not present

## 2022-04-07 DIAGNOSIS — E1122 Type 2 diabetes mellitus with diabetic chronic kidney disease: Secondary | ICD-10-CM | POA: Diagnosis not present

## 2022-04-07 DIAGNOSIS — Z794 Long term (current) use of insulin: Secondary | ICD-10-CM | POA: Diagnosis not present

## 2022-04-07 DIAGNOSIS — N186 End stage renal disease: Secondary | ICD-10-CM | POA: Diagnosis not present

## 2022-04-07 DIAGNOSIS — R42 Dizziness and giddiness: Secondary | ICD-10-CM | POA: Diagnosis not present

## 2022-04-07 LAB — ECHOCARDIOGRAM COMPLETE
AR max vel: 2.13 cm2
AV Area VTI: 2.22 cm2
AV Area mean vel: 2.01 cm2
AV Mean grad: 4 mmHg
AV Peak grad: 7.4 mmHg
Ao pk vel: 1.36 m/s
Area-P 1/2: 2.19 cm2
Calc EF: 66.4 %
Height: 71 in
MV VTI: 2.54 cm2
S' Lateral: 3 cm
Single Plane A2C EF: 70.2 %
Single Plane A4C EF: 62.3 %
Weight: 2723.12 oz

## 2022-04-07 LAB — LIPID PANEL
Cholesterol: 197 mg/dL (ref 0–200)
HDL: 29 mg/dL — ABNORMAL LOW (ref >40)
LDL Cholesterol: 137 mg/dL — ABNORMAL HIGH (ref 0–99)
Total CHOL/HDL Ratio: 6.8 RATIO
Triglycerides: 154 mg/dL — ABNORMAL HIGH (ref <150)
VLDL: 31 mg/dL (ref 0–40)

## 2022-04-07 LAB — GLUCOSE, CAPILLARY
Glucose-Capillary: 139 mg/dL — ABNORMAL HIGH (ref 70–99)
Glucose-Capillary: 147 mg/dL — ABNORMAL HIGH (ref 70–99)

## 2022-04-07 LAB — URINALYSIS, ROUTINE W REFLEX MICROSCOPIC
Bacteria, UA: NONE SEEN
Bilirubin Urine: NEGATIVE
Glucose, UA: 50 mg/dL — AB
Hgb urine dipstick: NEGATIVE
Ketones, ur: NEGATIVE mg/dL
Leukocytes,Ua: NEGATIVE
Nitrite: NEGATIVE
Protein, ur: >=300 mg/dL — AB
Specific Gravity, Urine: 1.017 (ref 1.005–1.030)
pH: 7 (ref 5.0–8.0)

## 2022-04-07 LAB — HEPATITIS B SURFACE ANTIBODY,QUALITATIVE: Hep B S Ab: REACTIVE — AB

## 2022-04-07 LAB — HEPATITIS B SURFACE ANTIGEN: Hepatitis B Surface Ag: NONREACTIVE

## 2022-04-07 LAB — HEPATITIS C ANTIBODY: HCV Ab: NONREACTIVE

## 2022-04-07 LAB — HEPATITIS B CORE ANTIBODY, TOTAL: Hep B Core Total Ab: NONREACTIVE

## 2022-04-07 MED ORDER — DILTIAZEM HCL ER COATED BEADS 240 MG PO CP24: 240.0000 mg | ORAL_CAPSULE | Freq: Two times a day (BID) | ORAL | Status: DC

## 2022-04-07 MED ORDER — CARVEDILOL 3.125 MG PO TABS: 6.2500 mg | ORAL_TABLET | Freq: Two times a day (BID) | ORAL | 0 refills | Status: DC

## 2022-04-07 MED ORDER — INSULIN GLARGINE 100 UNIT/ML ~~LOC~~ SOLN: 6.0000 [IU] | Freq: Two times a day (BID) | SUBCUTANEOUS | Status: DC

## 2022-04-07 MED ORDER — CLOPIDOGREL BISULFATE 75 MG PO TABS
75.0000 mg | ORAL_TABLET | Freq: Every day | ORAL | 0 refills | Status: AC
Start: 2022-04-08 — End: 2022-07-07

## 2022-04-07 MED ORDER — HYDRALAZINE HCL 50 MG PO TABS: 150.0000 mg | ORAL_TABLET | Freq: Two times a day (BID) | ORAL | 0 refills | Status: DC

## 2022-04-07 MED ORDER — ASPIRIN 81 MG PO TBEC: 81.0000 mg | DELAYED_RELEASE_TABLET | Freq: Every day | ORAL | 12 refills | Status: AC

## 2022-04-07 MED ORDER — ATORVASTATIN CALCIUM 80 MG PO TABS: 80.0000 mg | ORAL_TABLET | Freq: Every day | ORAL | 1 refills | Status: DC

## 2022-04-07 NOTE — Consult Note (Signed)
Omar Parker Admit Date: 04/06/2022 04/07/2022 Rexene Agent Requesting Physician:  Wynetta Emery MD  Reason for Consult: ESRD, management HPI:  74M ESRD THS at [facility] in [place] using left upper arm AV graft admitted yesterday after presenting with dizziness and trouble with ambulation.  Work-up in the ED identified acute ischemic CVA on brain MRI at the posterior left medial pons.  Patient admitted for stroke evaluation.  Last dialysis was yesterday 6/8, uneventful.  He is hopeful to leave the hospital today.  Labs here notable for potassium 3.7, bicarbonate 27, BUN of 22.  His AV graft is with bruit and thrill.  This morning he feels well.  His only complaint is a little bit of tingling in his right arm.  Outpatient dialysis orders are from care everywhere: 4 hours, BFR 400, 15-gauge needles, 3K bath.  PMH Incudes: Hypertension DM2 GERD Hyperlipidemia  ROS Balance of 12 systems is negative w/ exceptions as above  PMH  Past Medical History:  Diagnosis Date   Anemia    IDA   Diabetes mellitus without complication (HCC)    GERD (gastroesophageal reflux disease)    High cholesterol    Hypertension    Stage 4 chronic kidney disease (HCC)    PSH  Past Surgical History:  Procedure Laterality Date   AV FISTULA PLACEMENT Left 06/28/2020   Procedure: LEFT UPPER ARM ARTERIOVENOUS (AV) GORE-TEX GRAFT;  Surgeon: Elam Dutch, MD;  Location: [facility] OR;  Service: Vascular;  Laterality: Left;   COLONOSCOPY N/A 05/16/2017   Procedure: COLONOSCOPY;  Surgeon: Rogene Houston, MD;  Location: [facility];  Service: Endoscopy;  Laterality: N/A;  7:30   COLONOSCOPY N/A 04/10/2020   Procedure: COLONOSCOPY;  Surgeon: Daneil Dolin, MD;  Location: [facility];  Service: Endoscopy;  Laterality: N/A;   ESOPHAGOGASTRODUODENOSCOPY N/A 04/10/2020   Procedure: ESOPHAGOGASTRODUODENOSCOPY (EGD);  Surgeon: Daneil Dolin, MD;  Location: [facility];  Service: Endoscopy;  Laterality: N/A;   HERNIA  REPAIR     INSERTION OF DIALYSIS CATHETER  06/28/2020   Procedure: INSERTION OF DIALYSIS CATHETER;  Surgeon: Elam Dutch, MD;  Location: [facility] OR;  Service: Vascular;;   IR REMOVAL TUN CV CATH W/O FL  09/21/2020   POLYPECTOMY  04/10/2020   Procedure: POLYPECTOMY;  Surgeon: Daneil Dolin, MD;  Location: [facility];  Service: Endoscopy;;  cecal;   FH  Family History  Problem Relation Age of Onset   Chronic Renal Failure Neg Hx    SH  reports that he has never smoked. He has never used smokeless tobacco. He reports that he does not currently use alcohol. He reports that he does not currently use drugs after having used the following drugs: Marijuana. Allergies  Allergies  Allergen Reactions   Ace Inhibitors Swelling   Home medications Prior to Admission medications   Medication Sig Start Date End Date Taking? Authorizing Provider  acetaminophen (TYLENOL) 325 MG tablet Take 650 mg by mouth every 6 (six) hours as needed for moderate pain.    Yes [provider]  carvedilol (COREG) 3.125 MG tablet Take 1 tablet (3.125 mg total) by mouth 2 (two) times daily with a meal. Patient taking differently: Take 6.25 mg by mouth 2 (two) times daily with a meal. 04/11/20 04/06/22 Yes Johnson, Clanford L, MD  diltiazem (CARDIZEM CD) 240 MG 24 hr capsule Take 240 mg by mouth 2 (two) times daily. 04/12/20  Yes [provider]  furosemide (LASIX) 20 MG tablet Take 20  mg by mouth daily.   Yes [provider]  hydrALAZINE (APRESOLINE) 50 MG tablet Take 1 tablet (50 mg total) by mouth every 8 (eight) hours. Patient taking differently: Take 150 mg by mouth 2 (two) times daily. 04/10/20 04/06/22 Yes Johnson, Clanford L, MD  insulin glargine (LANTUS) 100 UNIT/ML injection Inject 0.1 mLs (10 Units total) into the skin at bedtime. Patient taking differently: Inject 6 Units into the skin 2 (two) times daily. 04/12/20  Yes Johnson, Clanford L, MD  pantoprazole (PROTONIX) 40 MG tablet Take 40  mg by mouth daily.    Yes [provider]  HYDROcodone-acetaminophen (NORCO/VICODIN) 5-325 MG tablet Take 1 tablet by mouth every 6 (six) hours as needed for moderate pain. Patient not taking: Reported on 04/06/2022 06/28/20   Gabriel Earing, PA-C  Magnesium Oxide 400 MG CAPS Take 1 capsule (400 mg total) by mouth daily. Patient not taking: Reported on 04/06/2022 03/03/20   Geradine Girt, DO  potassium chloride (KLOR-CON M10) 10 MEQ tablet Take 1 tablet (10 mEq total) by mouth daily. Patient not taking: Reported on 04/06/2022 03/03/20   Geradine Girt, DO    Current Medications Scheduled Meds:  aspirin EC  81 mg Oral Daily   atorvastatin  80 mg Oral q1800   clopidogrel  75 mg Oral Daily   heparin  5,000 Units Subcutaneous Q8H   insulin aspart  0-5 Units Subcutaneous QHS   insulin aspart  0-9 Units Subcutaneous TID WC   insulin glargine-yfgn  6 Units Subcutaneous BID   pantoprazole  40 mg Oral BID   Continuous Infusions: PRN Meds:.acetaminophen, senna-docusate  CBC Recent Labs  Lab 04/06/22 1111  WBC 8.1  HGB 12.9*  HCT 41.7  MCV 84.1  PLT 502   Basic Metabolic Panel Recent Labs  Lab 04/06/22 1111  NA 137  K 3.7  CL 97*  CO2 27  GLUCOSE 185*  BUN 22  CREATININE 3.02*  CALCIUM 8.7*    Physical Exam   Blood pressure (!) 144/96, pulse 73, temperature 98.1 F (36.7 C), resp. rate 18, height 5\' 11"  (1.803 m), weight 77.2 kg, SpO2 100 %. GEN: NAD, conversant, ENT: NCAT EYES: EOMI CV: Regular, normal S1 and S2 PULM: Clear bilaterally ABD: Soft, nontender SKIN: No rashes or lesions EXT: No edema VASCULAR: LUE AVG with bruit and thrill  Assessment 64M ESRD THS [facility]  via LUE AVG with acute CVA.  ESRD THS [facility] on schedule Acute ischemic CVA, stable today, minimal neuro Sx HTN, BPs midly elevated, permissive at this time Anemia, hemoglobin 12.9 CKD-BMM, calcium stable.  Continue outpatient therapies DM2  Plan HD tomorrow if inpatient, if  otherwise stable for discharge she can receive as an outpatient. 3K 2 LUF Max, no Hep, AVG 15g, We will go ahead and write orders Daily weights, Daily Renal Panel, Strict I/Os, Avoid nephrotoxins (NSAIDs, judicious IV Contrast)    Rexene Agent  04/07/2022, 10:56 AM

## 2022-04-07 NOTE — Care Management Obs Status (Signed)
[facility] NOTIFICATION   Patient Details  Name: [name] MRN: [id number] Date of Birth: [birth year]   Medicare Observation Status Notification Given:  Yes    Boneta Lucks, RN 04/07/2022, 11:32 AM

## 2022-04-07 NOTE — Discharge Instructions (Addendum)
PLEASE HAVE YOUR PRIMARY CARE PROVIDER OR NEUROLOGIST ARRANGE FOR A 30 DAY CARDIAC MONITOR AS PART OF STROKE WORK UP   PLEASE TAKE PLAVIX WITH ASPIRIN FOR 3 MONTHS ONLY AND THEN STOP.  CONTINUE TAKING THE ASPIRIN 81 MG DAILY AFTER THAT.     IMPORTANT INFORMATION: PAY CLOSE ATTENTION   PHYSICIAN DISCHARGE INSTRUCTIONS  Follow with Primary care provider  Redmond School, MD  and other consultants as instructed by your Hospitalist Physician  [place] IF SYMPTOMS COME BACK, WORSEN OR NEW PROBLEM DEVELOPS   Please note: You were cared for by a hospitalist during your hospital stay. Every effort will be made to forward records to your primary care provider.  You can request that your primary care provider send for your hospital records if they have not received them.  Once you are discharged, your primary care physician will handle any further medical issues. Please note that NO REFILLS for any discharge medications will be authorized once you are discharged, as it is imperative that you return to your primary care physician (or establish a relationship with a primary care physician if you do not have one) for your post hospital discharge needs so that they can reassess your need for medications and monitor your lab values.  Please get a complete blood count and chemistry panel checked by your Primary MD at your next visit, and again as instructed by your Primary MD.  Get Medicines reviewed and adjusted: Please take all your medications with you for your next visit with your Primary MD  Laboratory/radiological data: Please request your Primary MD to go over all hospital tests and procedure/radiological results at the follow up, please ask your primary care provider to get all Hospital records sent to his/her office.  In some cases, they will be blood work, cultures and biopsy results pending at the time of your discharge. Please request that your primary care  provider follow up on these results.  If you are diabetic, please bring your blood sugar readings with you to your follow up appointment with primary care.    Please call and make your follow up appointments as soon as possible.    Also Note the following: If you experience worsening of your admission symptoms, develop shortness of breath, life threatening emergency, suicidal or homicidal thoughts you must seek medical attention immediately by calling 911 or calling your MD immediately  if symptoms less severe.  You must read complete instructions/literature along with all the possible adverse reactions/side effects for all the Medicines you take and that have been prescribed to you. Take any new Medicines after you have completely understood and accpet all the possible adverse reactions/side effects.   Do not drive when taking Pain medications or sleeping medications (Benzodiazepines)  Do not take more than prescribed Pain, Sleep and Anxiety Medications. It is not advisable to combine anxiety,sleep and pain medications without talking with your primary care practitioner  Special Instructions: If you have smoked or chewed Tobacco  in the last 2 yrs please stop smoking, stop any regular Alcohol  and or any Recreational drug use.  Wear Seat belts while driving.  Do not drive if taking any narcotic, mind altering or controlled substances or recreational drugs or alcohol.

## 2022-04-07 NOTE — Care Management CC44 (Signed)
Condition Code 44 Documentation Completed  Patient Details  Name: Omar Parker MRN: [id number] Date of Birth: [birth year]   Condition Code 44 given:  Yes Patient signature on Condition Code 44 notice:  Yes Documentation of 2 MD's agreement:  Yes Code 44 added to claim:  Yes    Boneta Lucks, RN 04/07/2022, 11:33 AM

## 2022-04-07 NOTE — Evaluation (Signed)
Occupational Therapy Evaluation Patient Details Name: Omar Parker MRN: [id number] DOB: February 11, 1946 Today's Date: 04/07/2022   History of Present Illness Omar Parker  is a 76 y.o. male, past medical history of ESRD, on HD, diabetes mellitus, insulin-dependent, with renal complication, anemia of chronic kidney disease, diverticulosis, history of GI bleed and 2021 endoscopy significant for erosive gastropathy, colonoscopy significant for reticulosis in the sigmoid and descending colon, and polyps which were removed.  -Yesterday secondary to complaints of dizziness, reports symptoms started yesterday around noontime, report he was watching TV, he felt unsteadiness, and he felt vertigo with things were moving around him, denies any loss of consciousness, altered mental status, fever, chills, reports tingling and numbness in the right arm, he denies any history of vertigo in the past, is known ESRD patient, on TTS schedule, was dialyzed today with no issues..  - in ED blood pressure uncontrolled, 193/85, second for creatinine of 3.02, potassium 3.7, hemoglobin stable at 12.9, CT head with no acute findings, reading of brain MRI was called to ED physician of acute stroke, full dose aspirin has been ordered, [facility] hospitalist consulted to admit. (Per MD)   Clinical Impression   Pt. Agreeable to OT evaluation. He lives at home with his wife, who is able to assist him if needed. Pt. Reports that he is feeling back to baseline with functional mobility and completing ADLs. He completed bed mobility independently going from supine to sitting EOB. Pt. Independently donned socks and shoes while seated EOB. Completed functional mobility around room and to bathroom with independence. Pt. Demonstrated ability to complete all transfers, including toilet transfer with independence. Completed ROM and MMT to UE, no deficits identified. Pt. Does not require and further OT services.      Recommendations for follow up therapy are  one component of a multi-disciplinary discharge planning process, led by the attending physician.  Recommendations may be updated based on patient status, additional functional criteria and insurance authorization.   Follow Up Recommendations  No OT follow up    Assistance Recommended at Discharge None        Functional Status Assessment  Patient has had a recent decline in their functional status and demonstrates the ability to make significant improvements in function in a reasonable and predictable amount of time.  Equipment Recommendations  None recommended by OT    Recommendations for Other Services       Precautions / Restrictions Precautions Precautions: Fall Restrictions Weight Bearing Restrictions: No      Mobility Bed Mobility Overal bed mobility: Independent             General bed mobility comments: Idependently completed bed mobility- supin eto sitting EOB    Transfers Overall transfer level: Independent Equipment used: None               General transfer comment: Independent sit to stand t/f and toilet t/f      Balance Overall balance assessment: Independent                                         ADL either performed or assessed with clinical judgement   ADL Overall ADL's : Independent  General ADL Comments: Pt. independently donnes socks and shoes while seated at bed side     Vision Baseline Vision/History: 0 No visual deficits (Pt. reports that he wears glasses for reading) Ability to See in Adequate Light: 0 Adequate Patient Visual Report: No change from baseline Vision Assessment?: No apparent visual deficits            Pertinent Vitals/Pain Pain Assessment Pain Assessment: No/denies pain     Hand Dominance Right   Extremity/Trunk Assessment Upper Extremity Assessment Upper Extremity Assessment: Overall WFL for tasks assessed (Pt. reports tingling in  RUE, able to detect light touch.)   Lower Extremity Assessment Lower Extremity Assessment: Defer to PT evaluation   Cervical / Trunk Assessment Cervical / Trunk Assessment: Normal   Communication Communication Communication: No difficulties   Cognition Arousal/Alertness: Awake/alert Behavior During Therapy: WFL for tasks assessed/performed Overall Cognitive Status: Within Functional Limits for tasks assessed                                                  Home Living Family/patient expects to be discharged to:: Private residence Living Arrangements: Spouse/significant other Available Help at Discharge: Family Type of Home: House Home Access: Other (comment) (No STE, level enterance)     Home Layout: One level     Bathroom Shower/Tub: Teacher, early years/pre: Standard Bathroom Accessibility: Yes How Accessible: Accessible via walker Home Equipment: None          Prior Functioning/Environment Prior Level of Function : Independent/Modified Independent             Mobility Comments: Independent with mobility. [employer], drives. ADLs Comments: Independent with all ADLs and IADLs.                OT Treatment/Interventions:      OT Goals(Current goals can be found in the care plan section) Acute Rehab OT Goals Patient Stated Goal: to go home OT Goal Formulation: With patient  OT Frequency:      AM-PAC OT "6 Clicks" Daily Activity     Outcome Measure Help from another person eating meals?: None Help from another person taking care of personal grooming?: None Help from another person toileting, which includes using toliet, bedpan, or urinal?: None Help from another person bathing (including washing, rinsing, drying)?: None Help from another person to put on and taking off regular upper body clothing?: None Help from another person to put on and taking off regular lower body clothing?: None 6 Click Score: 24   End  of Session    Activity Tolerance: Patient tolerated treatment well Patient left: in bed;with call bell/phone within reach                   Time: 0823-0835 OT Time Calculation (min): 12 min Charges:  OT General Charges $OT Visit: 1 Visit OT Evaluation $OT Eval Low Complexity: 1 Low Rationale for Evaluation and Treatment Rehabilitation  Frederic Jericho OTR/L 04/07/2022, 8:46 AM

## 2022-04-07 NOTE — Progress Notes (Signed)
SLP Cancellation Note  Patient Details Name: Omar Parker MRN: [id number] DOB: 30-Oct-1946   Cancelled treatment:       Reason Eval/Treat Not Completed: SLP screened, no needs identified, will sign off. Speech, language and cognition are functioning at baseline.  Thank you,  Chenise Mulvihill H. Roddie Mc, CCC-SLP Speech Language Pathologist    Wende Bushy 04/07/2022, 1:52 PM

## 2022-04-07 NOTE — Progress Notes (Signed)
*  PRELIMINARY RESULTS* Echocardiogram 2D Echocardiogram has been performed.  Omar Parker 04/07/2022, 1:51 PM

## 2022-04-07 NOTE — TOC Transition Note (Signed)
Transition of Care [facility]) - CM/SW Discharge Note   Patient Details  Name: Omar Parker MRN: [id number] Date of Birth: 06/10/1946  Transition of [facility] CM/SW Contact:  Boneta Lucks, RN Phone Number: 04/07/2022, 1:10 PM   Clinical Narrative:   TOC at the bedside, Code 44 completed. TOC offering home health. Patient has no needs at this time, ready to discharge home  Final next level of care: Home/Self Care Barriers to Discharge: No Barriers Identified   Patient Goals and CMS Choice Patient states their goals for this hospitalization and ongoing recovery are:: to go home. CMS Medicare.gov Compare Post Acute Care list provided to:: Patient

## 2022-04-07 NOTE — Progress Notes (Signed)
PT Cancellation Note  Patient Details Name: Omar Parker MRN: [id number] DOB: October 05, 1946   Cancelled Treatment:    Reason Eval/Treat Not Completed: PT screened, no needs identified, will sign off; Patient ambulating in hall with OT. Patient able to ambulate without AD without apparent balance or strength deficits. Spoke with patient who states his legs are feeling almost back to baseline. Patient does not require PT services at this time.  9:21 AM, 04/07/22 Mearl Latin PT, DPT Physical Therapist at [facility]

## 2022-04-07 NOTE — Progress Notes (Signed)
Nsg Discharge Note  Admit Date:  04/06/2022 Discharge date: 04/07/2022   [name] to be D/C'd Home per MD order.  AVS completed.   Patient/caregiver able to verbalize understanding.  Discharge Medication: Allergies as of 04/07/2022       Reactions   Ace Inhibitors Swelling        Medication List     STOP taking these medications    HYDROcodone-acetaminophen 5-325 MG tablet Commonly known as: NORCO/VICODIN   Magnesium Oxide 400 MG Caps   potassium chloride 10 MEQ tablet Commonly known as: [place]-Con M10       TAKE these medications    acetaminophen 325 MG tablet Commonly known as: TYLENOL Take 650 mg by mouth every 6 (six) hours as needed for moderate pain.   aspirin EC 81 MG tablet Take 1 tablet (81 mg total) by mouth daily. Swallow whole. Start taking on: April 08, 2022   atorvastatin 80 MG tablet Commonly known as: LIPITOR Take 1 tablet (80 mg total) by mouth daily at 6 PM.   carvedilol 3.125 MG tablet Commonly known as: COREG Take 2 tablets (6.25 mg total) by mouth 2 (two) times daily with a meal. Start taking on: April 08, 2022   clopidogrel 75 MG tablet Commonly known as: PLAVIX Take 1 tablet (75 mg total) by mouth daily. Start taking on: April 08, 2022   diltiazem 240 MG 24 hr capsule Commonly known as: CARDIZEM CD Take 1 capsule (240 mg total) by mouth 2 (two) times daily. Start taking on: April 08, 2022   furosemide 20 MG tablet Commonly known as: LASIX Take 20 mg by mouth daily.   hydrALAZINE 50 MG tablet Commonly known as: APRESOLINE Take 3 tablets (150 mg total) by mouth 2 (two) times daily.   insulin glargine 100 UNIT/ML injection Commonly known as: LANTUS Inject 0.06 mLs (6 Units total) into the skin 2 (two) times daily.   pantoprazole 40 MG tablet Commonly known as: PROTONIX Take 40 mg by mouth daily.        Discharge Assessment: Vitals:   04/07/22 0558 04/07/22 1044  BP: (!) 171/80 (!) 144/96  Pulse: 72 73  Resp: 18 18   Temp: 98 F (36.7 C) 98.1 F (36.7 C)  SpO2: 100% 100%   Skin clean, dry and intact without evidence of skin break down, no evidence of skin tears noted. IV catheter discontinued intact. Site without signs and symptoms of complications - no redness or edema noted at insertion site, patient denies c/o pain - only slight tenderness at site.  Dressing with slight pressure applied.  D/c Instructions-Education: Discharge instructions given to patient/family with verbalized understanding. D/c education completed with patient/family including follow up instructions, medication list, d/c activities limitations if indicated, with other d/c instructions as indicated by MD - patient able to verbalize understanding, all questions fully answered. Patient instructed to return to ED, call 911, or call MD for any changes in condition.  Patient escorted via [place], and D/C home via private auto.  Kathie Rhodes, RN 04/07/2022 3:13 PM

## 2022-04-07 NOTE — Progress Notes (Signed)
04/07/2022 2:43 PM  I sent message to Karen Kays [employer] at [facility] [place] office to request patient be sent a 30 day cardiac monitor to his home.  I had called the office but was on hold for 30 mins and could not hold any longer.    Murvin Natal, MD How to contact the [facility] Attending or Consulting provider [place] or covering provider during after hours [place], for this patient?  Check the care team in [facility] and look for a) attending/consulting TR[facility] provider listed and b) the [facility] team listed Log into [email or web address] and use [place]'s universal password to access. If you do not have the password, please contact the hospital operator. Locate the [facility] provider you are looking for under [facility] and page to a number that you can be directly reached. If you still have difficulty reaching the provider, please page the [facility] (Director on Call) for the Hospitalists listed on amion for assistance.

## 2022-04-07 NOTE — Discharge Summary (Addendum)
Physician Discharge Summary  Omar Parker AVW:[id number] DOB: 1946/09/03 DOA: 04/06/2022  PCP: Redmond School, MD  Admit date: 04/06/2022 Discharge date: 04/07/2022  Admitted From:  Home  Disposition: Home   Recommendations for Outpatient Follow-up:  Follow up with PCP in 1 weeks Follow up with neurologist in 1 month Please arrange 30 day cardiac monitor as part of stroke work up  Discharge Condition: Stable   CODE STATUS: Full   Diet: renal   Brief Hospitalization Summary: Please see all hospital notes, images, labs for full details of the hospitalization.  76 y.o. male, past medical history of ESRD, on HD, diabetes mellitus, insulin-dependent, with renal complication, anemia of chronic kidney disease, diverticulosis, history of GI bleed and 2021 endoscopy significant for erosive gastropathy, colonoscopy significant for reticulosis in the sigmoid and descending colon, and polyps which were removed. -Yesterday secondary to complaints of dizziness, reports symptoms started yesterday around noontime, report he was watching TV, he felt unsteadiness, and he felt vertigo with things were moving around him, denies any loss of consciousness, altered mental status, fever, chills, reports tingling and numbness in the right arm, he denies any history of vertigo in the past, is known ESRD patient, on TTS schedule, was dialyzed today with no issues.. - in ED blood pressure uncontrolled, 193/85, second for creatinine of 3.02, potassium 3.7, hemoglobin stable at 12.9, CT head with no acute findings, reading of brain MRI was called to ED physician of acute stroke, full dose aspirin has been ordered, [facility] hospitalist consulted to admit.   Hospital Course:  Pt was admitted under observation for stroke is full stroke work-up.  Fortunately his symptoms have improved and he feels like he is back to his baseline.  In addition he was seen by the neurologist and recommendations were made for aspirin and Plavix for  90 days followed by aspirin alone.  His TTE was reassuring.  He will need a 30-day cardiac monitor.  I called the heart care office and was not able to get through to someone to have it set up but I sent a message in epic to [facility] and requested that a 30-day monitor be sent to his home.  I also left instructions for patient to speak to his neurologist and PCP on outpatient follow-up regarding the monitor.  He was started on atorvastatin 80 mg daily and permissive hypertension was allowed for the first couple of days.  He is stable and can discharge home.  He does not require home health services at this time.  Discharge Diagnoses:  Principal Problem:   TIA (transient ischemic attack) Active Problems:   Hypertension   High cholesterol   Type 2 diabetes mellitus with renal complication (HCC)   Diverticulosis   ESRD on dialysis [facility])   Vertigo   Focal neurological deficit   CVA (cerebral vascular accident) ([place])   Acute CVA (cerebrovascular accident) [facility])   Discharge Instructions: Discharge Instructions     Ambulatory referral to Neurology   Complete by: As directed    An appointment is requested in approximately: 4 weeks      Allergies as of 04/07/2022       Reactions   Ace Inhibitors Swelling        Medication List     STOP taking these medications    HYDROcodone-acetaminophen 5-325 MG tablet Commonly known as: NORCO/VICODIN   Magnesium Oxide 400 MG Caps   potassium chloride 10 MEQ tablet Commonly known as: Klor-Con M10  TAKE these medications    acetaminophen 325 MG tablet Commonly known as: TYLENOL Take 650 mg by mouth every 6 (six) hours as needed for moderate pain.   aspirin EC 81 MG tablet Take 1 tablet (81 mg total) by mouth daily. Swallow whole. Start taking on: April 08, 2022   atorvastatin 80 MG tablet Commonly known as: LIPITOR Take 1 tablet (80 mg total) by mouth daily at 6 PM.   carvedilol 3.125 MG tablet Commonly known as:  COREG Take 2 tablets (6.25 mg total) by mouth 2 (two) times daily with a meal. Start taking on: April 08, 2022   clopidogrel 75 MG tablet Commonly known as: PLAVIX Take 1 tablet (75 mg total) by mouth daily. Start taking on: April 08, 2022   diltiazem 240 MG 24 hr capsule Commonly known as: CARDIZEM CD Take 1 capsule (240 mg total) by mouth 2 (two) times daily. Start taking on: April 08, 2022   furosemide 20 MG tablet Commonly known as: LASIX Take 20 mg by mouth daily.   hydrALAZINE 50 MG tablet Commonly known as: APRESOLINE Take 3 tablets (150 mg total) by mouth 2 (two) times daily.   insulin glargine 100 UNIT/ML injection Commonly known as: LANTUS Inject 0.06 mLs (6 Units total) into the skin 2 (two) times daily.   pantoprazole 40 MG tablet Commonly known as: PROTONIX Take 40 mg by mouth daily.        Follow-up Information     Redmond School, MD. Schedule an appointment as soon as possible for a visit in 1 week(s).   Specialty: Internal Medicine Why: Hospital Follow Up Contact information: [address] [place] [address] [phone]         Jane. Schedule an appointment as soon as possible for a visit in 1 month(s).   Why: Hospital Follow Up Contact information: [place], [address] [address] [phone]               Allergies  Allergen Reactions   Ace Inhibitors Swelling   Allergies as of 04/07/2022       Reactions   Ace Inhibitors Swelling        Medication List     STOP taking these medications    HYDROcodone-acetaminophen 5-325 MG tablet Commonly known as: NORCO/VICODIN   Magnesium Oxide 400 MG Caps   potassium chloride 10 MEQ tablet Commonly known as: Klor-Con M10       TAKE these medications    acetaminophen 325 MG tablet Commonly known as: TYLENOL Take 650 mg by mouth every 6 (six) hours as needed for moderate pain.   aspirin EC 81 MG tablet Take 1 tablet  (81 mg total) by mouth daily. Swallow whole. Start taking on: April 08, 2022   atorvastatin 80 MG tablet Commonly known as: LIPITOR Take 1 tablet (80 mg total) by mouth daily at 6 PM.   carvedilol 3.125 MG tablet Commonly known as: COREG Take 2 tablets (6.25 mg total) by mouth 2 (two) times daily with a meal. Start taking on: April 08, 2022   clopidogrel 75 MG tablet Commonly known as: PLAVIX Take 1 tablet (75 mg total) by mouth daily. Start taking on: April 08, 2022   diltiazem 240 MG 24 hr capsule Commonly known as: CARDIZEM CD Take 1 capsule (240 mg total) by mouth 2 (two) times daily. Start taking on: April 08, 2022   furosemide 20 MG tablet Commonly known as: LASIX Take 20 mg by mouth  daily.   hydrALAZINE 50 MG tablet Commonly known as: APRESOLINE Take 3 tablets (150 mg total) by mouth 2 (two) times daily.   insulin glargine 100 UNIT/ML injection Commonly known as: LANTUS Inject 0.06 mLs (6 Units total) into the skin 2 (two) times daily.   pantoprazole 40 MG tablet Commonly known as: PROTONIX Take 40 mg by mouth daily.        Procedures/Studies: ECHOCARDIOGRAM COMPLETE  Result Date: 04/07/2022    ECHOCARDIOGRAM REPORT   Patient Name:   Omar Parker Date of Exam: 04/07/2022 Medical Rec #:  [id number]    Height:       71.0 in Accession #:    [id number]   Weight:       170.2 lb Date of Birth:  12/27/1945    BSA:          1.969 m Patient Age:    67 years     BP:           171/80 mmHg Patient Gender: M            HR:           73 bpm. Exam Location:  [facility] Procedure: 2D Echo, Cardiac Doppler and Color Doppler Indications:    TIA  History:        Patient has no prior history of Echocardiogram examinations.                 TIA; Risk Factors:Hypertension, Diabetes and Dyslipidemia.  Sonographer:    Wenda Low Referring Phys: Sherrill  1. Left ventricular ejection fraction, by estimation, is 60 to 65%. The left ventricle has normal function. The  left ventricle has no regional wall motion abnormalities. There is moderate asymmetric left ventricular hypertrophy of the basal segment. Left ventricular diastolic parameters are consistent with Grade I diastolic dysfunction (impaired relaxation).  2. Right ventricular systolic function is normal. The right ventricular size is normal. There is normal pulmonary artery systolic pressure. The estimated right ventricular systolic pressure is 86.7 mmHg.  3. The mitral valve is grossly normal. No evidence of mitral valve regurgitation.  4. The aortic valve is tricuspid. There is mild calcification of the aortic valve. Aortic valve regurgitation is mild. Aortic valve sclerosis is present, with no evidence of aortic valve stenosis. Aortic valve mean gradient measures 4.0 mmHg.  5. The inferior vena cava is normal in size with greater than 50% respiratory variability, suggesting right atrial pressure of 3 mmHg. Comparison(s): No prior Echocardiogram. FINDINGS  Left Ventricle: Left ventricular ejection fraction, by estimation, is 60 to 65%. The left ventricle has normal function. The left ventricle has no regional wall motion abnormalities. The left ventricular internal cavity size was normal in size. There is  moderate asymmetric left ventricular hypertrophy of the basal segment. Left ventricular diastolic parameters are consistent with Grade I diastolic dysfunction (impaired relaxation). Right Ventricle: The right ventricular size is normal. No increase in right ventricular wall thickness. Right ventricular systolic function is normal. There is normal pulmonary artery systolic pressure. The tricuspid regurgitant velocity is 2.57 m/s, and  with an assumed right atrial pressure of 3 mmHg, the estimated right ventricular systolic pressure is 61.9 mmHg. Left Atrium: Left atrial size was normal in size. Right Atrium: Right atrial size was normal in size. Pericardium: There is no evidence of pericardial effusion. Mitral Valve:  The mitral valve is grossly normal. No evidence of mitral valve regurgitation. MV peak gradient, 5.7 mmHg. The mean  mitral valve gradient is 2.0 mmHg. Tricuspid Valve: The tricuspid valve is grossly normal. Tricuspid valve regurgitation is trivial. Aortic Valve: The aortic valve is tricuspid. There is mild calcification of the aortic valve. There is mild aortic valve annular calcification. Aortic valve regurgitation is mild. Aortic valve sclerosis is present, with no evidence of aortic valve stenosis. Aortic valve mean gradient measures 4.0 mmHg. Aortic valve peak gradient measures 7.4 mmHg. Aortic valve area, by VTI measures 2.22 cm. Pulmonic Valve: The pulmonic valve was grossly normal. Pulmonic valve regurgitation is trivial. Aorta: The aortic root is normal in size and structure. Venous: The inferior vena cava is normal in size with greater than 50% respiratory variability, suggesting right atrial pressure of 3 mmHg. IAS/Shunts: No atrial level shunt detected by color flow Doppler.  LEFT VENTRICLE PLAX 2D LVIDd:         4.40 cm     Diastology LVIDs:         3.00 cm     LV e' medial:    3.92 cm/s LV PW:         1.10 cm     LV E/e' medial:  14.9 LV IVS:        1.40 cm     LV e' lateral:   7.83 cm/s LVOT diam:     2.00 cm     LV E/e' lateral: 7.4 LV SV:         68 LV SV Index:   35 LVOT Area:     3.14 cm  LV Volumes (MOD) LV vol d, MOD A2C: 80.8 ml LV vol d, MOD A4C: 81.6 ml LV vol s, MOD A2C: 24.1 ml LV vol s, MOD A4C: 30.8 ml LV SV MOD A2C:     56.7 ml LV SV MOD A4C:     81.6 ml LV SV MOD BP:      54.7 ml RIGHT VENTRICLE RV Basal diam:  3.60 cm RV Mid diam:    3.00 cm RV S prime:     11.60 cm/s TAPSE (M-mode): 3.1 cm LEFT ATRIUM             Index        RIGHT ATRIUM           Index LA diam:        3.60 cm 1.83 cm/m   RA Area:     16.60 cm LA Vol (A2C):   54.8 ml 27.83 ml/m  RA Volume:   41.70 ml  21.18 ml/m LA Vol (A4C):   39.6 ml 20.11 ml/m LA Biplane Vol: 46.4 ml 23.57 ml/m  AORTIC VALVE                     PULMONIC VALVE AV Area (Vmax):    2.13 cm     PV Vmax:       0.89 m/s AV Area (Vmean):   2.01 cm     PV Peak grad:  3.1 mmHg AV Area (VTI):     2.22 cm AV Vmax:           136.00 cm/s AV Vmean:          95.200 cm/s AV VTI:            0.308 m AV Peak Grad:      7.4 mmHg AV Mean Grad:      4.0 mmHg LVOT Vmax:         92.00 cm/s LVOT Vmean:  61.000 cm/s LVOT VTI:          0.218 m LVOT/AV VTI ratio: 0.71  AORTA Ao Root diam: 3.30 cm Ao Asc diam:  3.50 cm MITRAL VALVE                TRICUSPID VALVE MV Area (PHT): 2.19 cm     TR Peak grad:   26.4 mmHg MV Area VTI:   2.54 cm     TR Vmax:        257.00 cm/s MV Peak grad:  5.7 mmHg MV Mean grad:  2.0 mmHg     SHUNTS MV Vmax:       1.19 m/s     Systemic VTI:  0.22 m MV Vmean:      61.4 cm/s    Systemic Diam: 2.00 cm MV Decel Time: 347 msec MV E velocity: 58.30 cm/s MV A velocity: 108.00 cm/s MV E/A ratio:  0.54 Rozann Lesches MD Electronically signed by Rozann Lesches MD Signature Date/Time: 04/07/2022/2:06:28 PM    Final    MR BRAIN WO CONTRAST  Result Date: 04/06/2022 CLINICAL DATA:  Vertigo, stroke suspected EXAM: MRI HEAD WITHOUT CONTRAST MRA HEAD WITHOUT CONTRAST MRA NECK WITHOUT CONTRAST TECHNIQUE: Multiplanar, multi-echo pulse sequences of the brain and surrounding structures were acquired without intravenous contrast. Angiographic images of the Circle of Willis were acquired using MRA technique without intravenous contrast. Angiographic images of the neck were acquired using MRA technique without intravenous contrast. Carotid stenosis measurements (when applicable) are obtained utilizing NASCET criteria, using the distal internal carotid diameter as the denominator. COMPARISON:  No prior MRI, correlation is made with CT head 04/06/2022 FINDINGS: MRI HEAD FINDINGS Brain: Restricted diffusion with ADC correlate in the posterior left medial pons (series 5, image 9 and series 7, image 12), which measures up to 8 mm in greatest dimension. This area is  associated with mildly increased T2 hyperintense signal. No acute hemorrhage, mass, mass effect, or midline shift. No hydrocephalus or extra-axial collection. Scattered T2 hyperintense signal in the periventricular white matter and pons, likely the sequela of mild moderate chronic small vessel ischemic disease. Cerebral volume is within normal limits for age. Vascular: Please see MRA findings below. Skull and upper cervical spine: Normal marrow signal. Sinuses/Orbits: Chronic left maxillary sinusitis. The orbits are unremarkable. Other: Fluid throughout the left mastoid air cells. MRA HEAD FINDINGS Anterior circulation: Both internal carotid arteries are patent to the termini, with moderate narrowing in the right cavernous and proximal supraclinoid ICA. Mild narrowing in the left proximal supraclinoid ICA. Patent left A1. The right A1 is not visualized and may be aplastic or stenotic. Normal anterior communicating artery. Anterior cerebral arteries are patent to their distal aspects. No M1 stenosis or occlusion. Normal MCA bifurcations. Possible moderate narrowing in the proximal right inferior M2 branch (series 100, image 90 and 93). Distal MCA branches otherwise well perfused. Posterior circulation: No signal in the right V4. The left vertebral artery is patent to the vertebrobasilar junction without significant stenosis. The left PICA is patent. A right AICA is noted. The right PICA is not visualized. Vertebral arteries patent to the vertebrobasilar junction without stenosis. Posterior inferior cerebral arteries patent bilaterally. Basilar patent to its distal aspect. Superior cerebellar arteries patent bilaterally. Patent P1 segments. PCAs perfused to their distal aspects without stenosis. The right posterior communicating artery is patent. The left posterior communicating artery is not visualized. Anatomic variants: None significant MRA NECK FINDINGS Evaluation is somewhat limited by motion artifact. Right  carotid  system: No evidence of dissection, occlusion, or hemodynamically significant stenosis (greater than 50%). Left carotid system: No evidence of dissection, occlusion, or hemodynamically significant stenosis (greater than 50%). Vertebral arteries: The imaged portions of the left vertebral artery are patent, without significant stenosis, dissection, or occlusion. No signal in the right vertebral artery throughout the imaged neck. Other: None IMPRESSION: 1. 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 2. No signal is seen in the right vertebral artery throughout the imaged portions of the neck, to the vertebrobasilar junction. No additional hemodynamically significant stenosis in the neck. 3. No intracranial large vessel occlusion. Moderate narrowing in the right cavernous and supraclinoid ICA and mild narrowing in the left supraclinoid ICA. Possible moderate narrowing in a right M2 branch, although this may be artifactual. These results were called by telephone at the time of interpretation on 04/06/2022 at 4:15 pm to provider GOLDSTON , who verbally acknowledged these results. Electronically Signed   By: Merilyn Baba M.D.   On: 04/06/2022 16:16   MR ANGIO HEAD WO CONTRAST  Result Date: 04/06/2022 CLINICAL DATA:  Vertigo, stroke suspected EXAM: MRI HEAD WITHOUT CONTRAST MRA HEAD WITHOUT CONTRAST MRA NECK WITHOUT CONTRAST TECHNIQUE: Multiplanar, multi-echo pulse sequences of the brain and surrounding structures were acquired without intravenous contrast. Angiographic images of the Circle of Willis were acquired using MRA technique without intravenous contrast. Angiographic images of the neck were acquired using MRA technique without intravenous contrast. Carotid stenosis measurements (when applicable) are obtained utilizing NASCET criteria, using the distal internal carotid diameter as the denominator. COMPARISON:  No prior MRI, correlation is made with CT head 04/06/2022 FINDINGS: MRI HEAD FINDINGS  Brain: Restricted diffusion with ADC correlate in the posterior left medial pons (series 5, image 9 and series 7, image 12), which measures up to 8 mm in greatest dimension. This area is associated with mildly increased T2 hyperintense signal. No acute hemorrhage, mass, mass effect, or midline shift. No hydrocephalus or extra-axial collection. Scattered T2 hyperintense signal in the periventricular white matter and pons, likely the sequela of mild moderate chronic small vessel ischemic disease. Cerebral volume is within normal limits for age. Vascular: Please see MRA findings below. Skull and upper cervical spine: Normal marrow signal. Sinuses/Orbits: Chronic left maxillary sinusitis. The orbits are unremarkable. Other: Fluid throughout the left mastoid air cells. MRA HEAD FINDINGS Anterior circulation: Both internal carotid arteries are patent to the termini, with moderate narrowing in the right cavernous and proximal supraclinoid ICA. Mild narrowing in the left proximal supraclinoid ICA. Patent left A1. The right A1 is not visualized and may be aplastic or stenotic. Normal anterior communicating artery. Anterior cerebral arteries are patent to their distal aspects. No M1 stenosis or occlusion. Normal MCA bifurcations. Possible moderate narrowing in the proximal right inferior M2 branch (series 100, image 90 and 93). Distal MCA branches otherwise well perfused. Posterior circulation: No signal in the right V4. The left vertebral artery is patent to the vertebrobasilar junction without significant stenosis. The left PICA is patent. A right AICA is noted. The right PICA is not visualized. Vertebral arteries patent to the vertebrobasilar junction without stenosis. Posterior inferior cerebral arteries patent bilaterally. Basilar patent to its distal aspect. Superior cerebellar arteries patent bilaterally. Patent P1 segments. PCAs perfused to their distal aspects without stenosis. The right posterior communicating  artery is patent. The left posterior communicating artery is not visualized. Anatomic variants: None significant MRA NECK FINDINGS Evaluation is somewhat limited by motion artifact. Right carotid system: No evidence of  dissection, occlusion, or hemodynamically significant stenosis (greater than 50%). Left carotid system: No evidence of dissection, occlusion, or hemodynamically significant stenosis (greater than 50%). Vertebral arteries: The imaged portions of the left vertebral artery are patent, without significant stenosis, dissection, or occlusion. No signal in the right vertebral artery throughout the imaged neck. Other: None IMPRESSION: 1. 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 2. No signal is seen in the right vertebral artery throughout the imaged portions of the neck, to the vertebrobasilar junction. No additional hemodynamically significant stenosis in the neck. 3. No intracranial large vessel occlusion. Moderate narrowing in the right cavernous and supraclinoid ICA and mild narrowing in the left supraclinoid ICA. Possible moderate narrowing in a right M2 branch, although this may be artifactual. These results were called by telephone at the time of interpretation on 04/06/2022 at 4:15 pm to provider GOLDSTON , who verbally acknowledged these results. Electronically Signed   By: Merilyn Baba M.D.   On: 04/06/2022 16:16   MR ANGIO NECK WO CONTRAST  Result Date: 04/06/2022 CLINICAL DATA:  Vertigo, stroke suspected EXAM: MRI HEAD WITHOUT CONTRAST MRA HEAD WITHOUT CONTRAST MRA NECK WITHOUT CONTRAST TECHNIQUE: Multiplanar, multi-echo pulse sequences of the brain and surrounding structures were acquired without intravenous contrast. Angiographic images of the Circle of Willis were acquired using MRA technique without intravenous contrast. Angiographic images of the neck were acquired using MRA technique without intravenous contrast. Carotid stenosis measurements (when applicable) are obtained  utilizing NASCET criteria, using the distal internal carotid diameter as the denominator. COMPARISON:  No prior MRI, correlation is made with CT head 04/06/2022 FINDINGS: MRI HEAD FINDINGS Brain: Restricted diffusion with ADC correlate in the posterior left medial pons (series 5, image 9 and series 7, image 12), which measures up to 8 mm in greatest dimension. This area is associated with mildly increased T2 hyperintense signal. No acute hemorrhage, mass, mass effect, or midline shift. No hydrocephalus or extra-axial collection. Scattered T2 hyperintense signal in the periventricular white matter and pons, likely the sequela of mild moderate chronic small vessel ischemic disease. Cerebral volume is within normal limits for age. Vascular: Please see MRA findings below. Skull and upper cervical spine: Normal marrow signal. Sinuses/Orbits: Chronic left maxillary sinusitis. The orbits are unremarkable. Other: Fluid throughout the left mastoid air cells. MRA HEAD FINDINGS Anterior circulation: Both internal carotid arteries are patent to the termini, with moderate narrowing in the right cavernous and proximal supraclinoid ICA. Mild narrowing in the left proximal supraclinoid ICA. Patent left A1. The right A1 is not visualized and may be aplastic or stenotic. Normal anterior communicating artery. Anterior cerebral arteries are patent to their distal aspects. No M1 stenosis or occlusion. Normal MCA bifurcations. Possible moderate narrowing in the proximal right inferior M2 branch (series 100, image 90 and 93). Distal MCA branches otherwise well perfused. Posterior circulation: No signal in the right V4. The left vertebral artery is patent to the vertebrobasilar junction without significant stenosis. The left PICA is patent. A right AICA is noted. The right PICA is not visualized. Vertebral arteries patent to the vertebrobasilar junction without stenosis. Posterior inferior cerebral arteries patent bilaterally. Basilar  patent to its distal aspect. Superior cerebellar arteries patent bilaterally. Patent P1 segments. PCAs perfused to their distal aspects without stenosis. The right posterior communicating artery is patent. The left posterior communicating artery is not visualized. Anatomic variants: None significant MRA NECK FINDINGS Evaluation is somewhat limited by motion artifact. Right carotid system: No evidence of dissection, occlusion, or hemodynamically  significant stenosis (greater than 50%). Left carotid system: No evidence of dissection, occlusion, or hemodynamically significant stenosis (greater than 50%). Vertebral arteries: The imaged portions of the left vertebral artery are patent, without significant stenosis, dissection, or occlusion. No signal in the right vertebral artery throughout the imaged neck. Other: None IMPRESSION: 1. 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 2. No signal is seen in the right vertebral artery throughout the imaged portions of the neck, to the vertebrobasilar junction. No additional hemodynamically significant stenosis in the neck. 3. No intracranial large vessel occlusion. Moderate narrowing in the right cavernous and supraclinoid ICA and mild narrowing in the left supraclinoid ICA. Possible moderate narrowing in a right M2 branch, although this may be artifactual. These results were called by telephone at the time of interpretation on 04/06/2022 at 4:15 pm to provider [place] , who verbally acknowledged these results. Electronically Signed   By: Merilyn Baba M.D.   On: 04/06/2022 16:16   CT Head Wo Contrast  Result Date: 04/06/2022 CLINICAL DATA:  Vertigo, central Neuro deficit, acute, stroke suspected EXAM: CT HEAD WITHOUT CONTRAST TECHNIQUE: Contiguous axial images were obtained from the base of the skull through the vertex without intravenous contrast. RADIATION DOSE REDUCTION: This exam was performed according to the departmental dose-optimization program which  includes automated exposure control, adjustment of the mA and/or kV according to patient size and/or use of iterative reconstruction technique. COMPARISON:  None Available. FINDINGS: Brain: There is no acute intracranial hemorrhage, mass effect, or edema. Gray-white differentiation is preserved. There is no extra-axial fluid collection. Ventricles and sulci are within normal limits in size and configuration. Patchy low-density in the supratentorial white matter is nonspecific but may reflect minor chronic microvascular ischemic changes. Vascular: There is atherosclerotic calcification at the skull base. Skull: Calvarium is unremarkable. Sinuses/Orbits: Partially imaged chronic left maxillary sinusitis. Orbits are unremarkable. Other: None. IMPRESSION: No acute intracranial abnormality. Electronically Signed   By: Macy Mis M.D.   On: 04/06/2022 12:52     Subjective: Pt reports that he is feeling like he is himself again.  No complaints, really wants to get home.    Discharge Exam: Vitals:   04/07/22 0558 04/07/22 1044  BP: (!) 171/80 (!) 144/96  Pulse: 72 73  Resp: 18 18  Temp: 98 F (36.7 C) 98.1 F (36.7 C)  SpO2: 100% 100%   Vitals:   04/07/22 0039 04/07/22 0236 04/07/22 0558 04/07/22 1044  BP: (!) 191/83 (!) 166/86 (!) 171/80 (!) 144/96  Pulse: 69 72 72 73  Resp: 18 18 18 18   Temp: 98.3 F (36.8 C) 98.1 F (36.7 C) 98 F (36.7 C) 98.1 F (36.7 C)  TempSrc:      SpO2: 99% 100% 100% 100%  Weight:      Height:       General: Pt is alert, awake, not in acute distress Cardiovascular: RRR, S1/S2 +, no rubs, no gallops Respiratory: CTA bilaterally, no wheezing, no rhonchi Abdominal: Soft, NT, ND, bowel sounds + Extremities: no edema, no cyanosis Neurological: nonfocal exam.    The results of significant diagnostics from this hospitalization (including imaging, microbiology, ancillary and laboratory) are listed below for reference.     Microbiology: No results found for  this or any previous visit (from the past 240 hour(s)).   Labs: BNP (last 3 results) No results for input(s): "BNP" in the last 8760 hours. Basic Metabolic Panel: Recent Labs  Lab 04/06/22 1111  NA 137  K 3.7  CL  97*  CO2 27  GLUCOSE 185*  BUN 22  CREATININE 3.02*  CALCIUM 8.7*   Liver Function Tests: No results for input(s): "AST", "ALT", "ALKPHOS", "BILITOT", "PROT", "ALBUMIN" in the last 168 hours. No results for input(s): "LIPASE", "AMYLASE" in the last 168 hours. No results for input(s): "AMMONIA" in the last 168 hours. CBC: Recent Labs  Lab 04/06/22 1111  WBC 8.1  HGB 12.9*  HCT 41.7  MCV 84.1  PLT 290   Cardiac Enzymes: No results for input(s): "CKTOTAL", "CKMB", "CKMBINDEX", "TROPONINI" in the last 168 hours. BNP: Invalid input(s): "POCBNP" CBG: Recent Labs  Lab 04/06/22 1052 04/06/22 2147 04/07/22 0742 04/07/22 1108  GLUCAP 189* 167* 139* 147*   D-Dimer No results for input(s): "DDIMER" in the last 72 hours. Hgb A1c Recent Labs    04/06/22 1111  HGBA1C 7.0*   Lipid Profile Recent Labs    04/07/22 0428  CHOL 197  HDL 29*  LDLCALC 137*  TRIG 154*  CHOLHDL 6.8   Thyroid function studies No results for input(s): "TSH", "T4TOTAL", "T3FREE", "THYROIDAB" in the last 72 hours.  Invalid input(s): "FREET3" Anemia work up No results for input(s): "VITAMINB12", "FOLATE", "FERRITIN", "TIBC", "IRON", "RETICCTPCT" in the last 72 hours. Urinalysis    Component Value Date/Time   COLORURINE YELLOW 04/07/2022 [place] 04/07/2022 0455   LABSPEC 1.017 04/07/2022 0455   PHURINE 7.0 04/07/2022 0455   GLUCOSEU 50 (A) 04/07/2022 0455   HGBUR NEGATIVE 04/07/2022 0455   BILIRUBINUR NEGATIVE 04/07/2022 0455   KETONESUR NEGATIVE 04/07/2022 0455   PROTEINUR >=300 (A) 04/07/2022 0455   NITRITE NEGATIVE 04/07/2022 0455   LEUKOCYTESUR NEGATIVE 04/07/2022 0455   Sepsis Labs Recent Labs  Lab 04/06/22 1111  WBC 8.1   Microbiology No  results found for this or any previous visit (from the past 240 hour(s)).  Time coordinating discharge:  38 mins   SIGNED:  Irwin Brakeman, MD  [facility] 04/07/2022, 3:19 PM How to contact the [facility] Attending or Consulting provider [place] or covering provider during after hours [place], for this patient?  Check the care team in [facility] and look for a) attending/consulting [facility] provider listed and b) the [facility] team listed Log into [email or web address] and use [place]'s universal password to access. If you do not have the password, please contact the hospital operator. Locate the [facility] provider you are looking for under [facility] and page to a number that you can be directly reached. If you still have difficulty reaching the provider, please page the [facility] (Director on Call) for the Hospitalists listed on amion for assistance.

## 2022-04-07 NOTE — Consult Note (Signed)
I connected with  Omar Parker on 04/07/22 by a video enabled telemedicine application and verified that I am speaking with the correct person using two identifiers.   I discussed the limitations of evaluation and management by telemedicine. The patient expressed understanding and agreed to proceed.  Location of patient: [facility] Location of physician: [facility]  Neurology Consultation Reason for Consult: stroke Referring Physician: Dr. Irwin Brakeman  CC: Dizziness, right arm numbness  History is obtained from: Patient, chart review  HPI: Omar Parker is a 76 y.o. male with past medical history of hypertension, hyperlipidemia, diabetes, end-stage renal disease on hemodialysis who presented with sudden onset dizziness and right arm numbness.    Patient states he was sitting in his house at around noon on 04/05/2022.  When he got up he suddenly felt off balance and tingling in his right arm.  When his symptoms did not improve yesterday morning he eventually came to the emergency room.  States his symptoms have since significantly improved.  Last known normal 04/05/2022 ~ noon Event happened at home No tPA as outside window No thrombectomy as no large vessel occlusion mRS 0  ROS: All other systems reviewed and negative except as noted in the HPI.   Past Medical History:  Diagnosis Date   Anemia    IDA   Diabetes mellitus without complication (HCC)    GERD (gastroesophageal reflux disease)    High cholesterol    Hypertension    Stage 4 chronic kidney disease (HCC)     Family History  Problem Relation Age of Onset   Chronic Renal Failure Neg Hx    Social History:  reports that he has never smoked. He has never used smokeless tobacco. He reports that he does not currently use alcohol. He reports that he does not currently use drugs after having used the following drugs: Marijuana.  Medications Prior to Admission  Medication Sig Dispense Refill Last Dose    acetaminophen (TYLENOL) 325 MG tablet Take 650 mg by mouth every 6 (six) hours as needed for moderate pain.       carvedilol (COREG) 3.125 MG tablet Take 1 tablet (3.125 mg total) by mouth 2 (two) times daily with a meal. (Patient taking differently: Take 6.25 mg by mouth 2 (two) times daily with a meal.) 60 tablet 0 04/06/2022 at 0800   diltiazem (CARDIZEM CD) 240 MG 24 hr capsule Take 240 mg by mouth 2 (two) times daily.   04/06/2022   furosemide (LASIX) 20 MG tablet Take 20 mg by mouth daily.   04/06/2022   hydrALAZINE (APRESOLINE) 50 MG tablet Take 1 tablet (50 mg total) by mouth every 8 (eight) hours. (Patient taking differently: Take 150 mg by mouth 2 (two) times daily.) 90 tablet 0 04/06/2022   insulin glargine (LANTUS) 100 UNIT/ML injection Inject 0.1 mLs (10 Units total) into the skin at bedtime. (Patient taking differently: Inject 6 Units into the skin 2 (two) times daily.) 10 mL 11 04/06/2022   pantoprazole (PROTONIX) 40 MG tablet Take 40 mg by mouth daily.    04/06/2022   HYDROcodone-acetaminophen (NORCO/VICODIN) 5-325 MG tablet Take 1 tablet by mouth every 6 (six) hours as needed for moderate pain. (Patient not taking: Reported on 04/06/2022) 16 tablet 0 Not Taking   Magnesium Oxide 400 MG CAPS Take 1 capsule (400 mg total) by mouth daily. (Patient not taking: Reported on 04/06/2022) 30 capsule 0 Not Taking   potassium chloride (KLOR-CON M10) 10 MEQ tablet Take  1 tablet (10 mEq total) by mouth daily. (Patient not taking: Reported on 04/06/2022) 30 tablet 0 Not Taking      Exam: Current vital signs: BP (!) 171/80 (BP Location: Right Arm)   Pulse 72   Temp 98 F (36.7 C)   Resp 18   Ht 5\' 11"  (1.803 m)   Wt 77.2 kg   SpO2 100%   BMI 23.74 kg/m  Vital signs in last 24 hours: Temp:  [97.6 F (36.4 C)-98.4 F (36.9 C)] 98 F (36.7 C) (06/09 0558) Pulse Rate:  [62-73] 72 (06/09 0558) Resp:  [10-18] 18 (06/09 0558) BP: (155-199)/(75-93) 171/80 (06/09 0558) SpO2:  [98 %-100 %] 100 % (06/09  0558) Weight:  [77.2 kg-79.4 kg] 77.2 kg (06/08 1834)   Physical Exam  Constitutional: Appears well-developed and well-nourished.  Psych: Affect appropriate to situation Eyes: No scleral injection Neuro: AOx3, no aphasia, cranial nerves 2-12 grossly intact, antigravity strength without drift in all extremities, FTN intact bilaterally, sensation intact to light touch   I have reviewed labs in epic and the results pertinent to this consultation are: CBC:  Recent Labs  Lab 04/06/22 1111  WBC 8.1  HGB 12.9*  HCT 41.7  MCV 84.1  PLT 295    Basic Metabolic Panel:  Lab Results  Component Value Date   NA 137 04/06/2022   K 3.7 04/06/2022   CO2 27 04/06/2022   GLUCOSE 185 (H) 04/06/2022   BUN 22 04/06/2022   CREATININE 3.02 (H) 04/06/2022   CALCIUM 8.7 (L) 04/06/2022   GFRNONAA 21 (L) 04/06/2022   GFRAA 11 (L) 04/10/2020   Lipid Panel:  Lab Results  Component Value Date   LDLCALC 137 (H) 04/07/2022   HgbA1c:  Lab Results  Component Value Date   HGBA1C 7.0 (H) 04/06/2022   Urine Drug Screen: No results found for: "LABOPIA", "COCAINSCRNUR", "LABBENZ", "AMPHETMU", "THCU", "LABBARB"  Alcohol Level No results found for: "ETH"   I have reviewed the images obtained:  CT head without contrast 04/06/2022: No acute intracranial abnormality  MRI brain without contrast 04/06/2022: Small acute infarct in the posterior left medial pons.  MRA head and neck without contrast 04/06/2022:No signal is seen in the right vertebral artery throughout the imaged portions of the neck, to the vertebrobasilar junction. No additional hemodynamically significant stenosis in the neck. No intracranial large vessel occlusion. Moderate narrowing in the right cavernous and supraclinoid ICA and mild narrowing in the left supraclinoid ICA. Possible moderate narrowing in a right M2 branch, although this may be artifactual.      ASSESSMENT/PLAN: 76 year old male with multiple medical comorbidities presented  with dizziness and showed acute left pontine stroke.  Acute ischemic stroke, left pons Diabetes End-stage renal disease on hemodialysis Hyperlipidemia Hypertension -Etiology: Suspected atheroembolic versus cardioembolic  Recommendations: -Aspirin 81 mg as well as Plavix and 5 mg daily for 3 months followed by aspirin 81 mg daily -Atorvastatin 80 mg daily -TTE ordered and pending -If TTE negative, recommend 30-day event monitor at the time of discharge -Risk factor modification, stroke education including BEFAST -Goal blood pressure: Permissive hypertension today followed by normotension from tomorrow -PT/OT/SLP -Neuro follow-up in 3 months -Discussed plan with Dr. Wynetta Emery   Thank you for allowing [place] to participate in the care of this patient. If you have any further questions, please contact  me or neurohospitalist.   Zeb Comfort Epilepsy [facility]

## 2022-04-08 LAB — HEPATITIS B SURFACE ANTIBODY, QUANTITATIVE: Hep B S AB Quant (Post): 37.6 m[IU]/mL (ref >9.9)

## 2022-04-11 ENCOUNTER — Telehealth: Payer: Self-pay | Admitting: *Deleted

## 2022-04-11 DIAGNOSIS — I639 Cerebral infarction, unspecified: Secondary | ICD-10-CM

## 2022-04-11 NOTE — Telephone Encounter (Signed)
Msg received from Dr. Wynetta Emery requesting 30 day event monitor for stroke. Pt enrolled in [employer] monitoring.

## 2022-04-12 DIAGNOSIS — G459 Transient cerebral ischemic attack, unspecified: Secondary | ICD-10-CM | POA: Diagnosis not present

## 2022-04-12 DIAGNOSIS — I10 Essential (primary) hypertension: Secondary | ICD-10-CM | POA: Diagnosis not present

## 2022-04-12 DIAGNOSIS — Z6823 Body mass index (BMI) 23.0-23.9, adult: Secondary | ICD-10-CM | POA: Diagnosis not present

## 2022-04-12 DIAGNOSIS — E1165 Type 2 diabetes mellitus with hyperglycemia: Secondary | ICD-10-CM | POA: Diagnosis not present

## 2022-08-24 ENCOUNTER — Telehealth: Payer: Self-pay | Admitting: *Deleted

## 2022-08-24 NOTE — Patient Outreach (Signed)
  Care Coordination   08/24/2022 Name: Omar Parker MRN: [id number] DOB: Dec 12, 1945   Care Coordination Outreach Attempts:  An unsuccessful telephone outreach was attempted today to offer the patient information about available care coordination services as a benefit of their health plan.   Follow Up Plan:  No further outreach attempts will be made at this time. We have been unable to contact the patient to offer or enroll patient in care coordination services  Encounter Outcome:  Pt. Refused  Care Coordination Interventions Activated:  No   Care Coordination Interventions:  No, not indicated    Chong Sicilian, BSN, RN-BC RN Care Coordinator [facility]: [phone] Main #: [phone]

## 2022-12-21 ENCOUNTER — Telehealth: Payer: Self-pay | Admitting: *Deleted

## 2022-12-21 NOTE — Progress Notes (Signed)
  Care Coordination   Note   12/21/2022 Name: Omar Parker MRN: [id number] DOB: Mar 11, 1946  Omar Parker is a 77 y.o. year old male who sees Redmond School, MD for primary care. I reached out to Omar Parker by phone today to offer care coordination services.  [name] was given information about Care Coordination services today including:   The Care Coordination services include support from the care team which includes your Nurse Coordinator, Clinical Social Worker, or Pharmacist.  The Care Coordination team is here to help remove barriers to the health concerns and goals most important to you. Care Coordination services are voluntary, and the patient may decline or stop services at any time by request to their care team member.   Care Coordination Consent Status: Patient agreed to services and verbal consent obtained.   Follow up plan:  Telephone appointment with care coordination team member scheduled for:  01/08/23  Encounter Outcome:  Pt. Scheduled  [facility]  Direct Dial: [phone]

## 2022-12-31 ENCOUNTER — Other Ambulatory Visit: Payer: Self-pay

## 2022-12-31 ENCOUNTER — Encounter (HOSPITAL_COMMUNITY): Payer: Self-pay | Admitting: *Deleted

## 2022-12-31 ENCOUNTER — Emergency Department (HOSPITAL_COMMUNITY): Payer: Medicare PPO

## 2022-12-31 ENCOUNTER — Observation Stay (HOSPITAL_COMMUNITY)
Admission: EM | Admit: 2022-12-31 | Discharge: 2023-01-02 | Disposition: A | Discharge status: Home / Self Care | Payer: Medicare PPO | Attending: Internal Medicine | Admitting: Internal Medicine

## 2022-12-31 DIAGNOSIS — R4182 Altered mental status, unspecified: Secondary | ICD-10-CM | POA: Insufficient documentation

## 2022-12-31 DIAGNOSIS — E1122 Type 2 diabetes mellitus with diabetic chronic kidney disease: Secondary | ICD-10-CM | POA: Insufficient documentation

## 2022-12-31 DIAGNOSIS — G9341 Metabolic encephalopathy: Principal | ICD-10-CM | POA: Diagnosis present

## 2022-12-31 DIAGNOSIS — I6501 Occlusion and stenosis of right vertebral artery: Secondary | ICD-10-CM | POA: Diagnosis not present

## 2022-12-31 DIAGNOSIS — I672 Cerebral atherosclerosis: Secondary | ICD-10-CM | POA: Diagnosis not present

## 2022-12-31 DIAGNOSIS — R569 Unspecified convulsions: Secondary | ICD-10-CM | POA: Diagnosis not present

## 2022-12-31 DIAGNOSIS — E1129 Type 2 diabetes mellitus with other diabetic kidney complication: Secondary | ICD-10-CM | POA: Diagnosis present

## 2022-12-31 DIAGNOSIS — E7800 Pure hypercholesterolemia, unspecified: Secondary | ICD-10-CM | POA: Diagnosis not present

## 2022-12-31 DIAGNOSIS — I169 Hypertensive crisis, unspecified: Secondary | ICD-10-CM | POA: Diagnosis not present

## 2022-12-31 DIAGNOSIS — I651 Occlusion and stenosis of basilar artery: Secondary | ICD-10-CM | POA: Insufficient documentation

## 2022-12-31 DIAGNOSIS — N186 End stage renal disease: Secondary | ICD-10-CM | POA: Diagnosis not present

## 2022-12-31 DIAGNOSIS — R410 Disorientation, unspecified: Secondary | ICD-10-CM | POA: Diagnosis not present

## 2022-12-31 DIAGNOSIS — Z79899 Other long term (current) drug therapy: Secondary | ICD-10-CM | POA: Diagnosis not present

## 2022-12-31 DIAGNOSIS — Z794 Long term (current) use of insulin: Secondary | ICD-10-CM | POA: Insufficient documentation

## 2022-12-31 DIAGNOSIS — Z992 Dependence on renal dialysis: Secondary | ICD-10-CM | POA: Insufficient documentation

## 2022-12-31 DIAGNOSIS — R4701 Aphasia: Secondary | ICD-10-CM | POA: Insufficient documentation

## 2022-12-31 DIAGNOSIS — Z7982 Long term (current) use of aspirin: Secondary | ICD-10-CM | POA: Diagnosis not present

## 2022-12-31 DIAGNOSIS — D631 Anemia in chronic kidney disease: Secondary | ICD-10-CM | POA: Diagnosis not present

## 2022-12-31 DIAGNOSIS — Z8673 Personal history of transient ischemic attack (TIA), and cerebral infarction without residual deficits: Secondary | ICD-10-CM | POA: Diagnosis not present

## 2022-12-31 DIAGNOSIS — R739 Hyperglycemia, unspecified: Secondary | ICD-10-CM | POA: Diagnosis not present

## 2022-12-31 DIAGNOSIS — K219 Gastro-esophageal reflux disease without esophagitis: Secondary | ICD-10-CM | POA: Insufficient documentation

## 2022-12-31 DIAGNOSIS — J320 Chronic maxillary sinusitis: Secondary | ICD-10-CM | POA: Insufficient documentation

## 2022-12-31 DIAGNOSIS — I10 Essential (primary) hypertension: Secondary | ICD-10-CM | POA: Diagnosis not present

## 2022-12-31 DIAGNOSIS — I120 Hypertensive chronic kidney disease with stage 5 chronic kidney disease or end stage renal disease: Secondary | ICD-10-CM | POA: Insufficient documentation

## 2022-12-31 LAB — CBC WITH DIFFERENTIAL/PLATELET
Abs Immature Granulocytes: 0.02 10*3/uL (ref 0.00–0.07)
Basophils Absolute: 0.1 10*3/uL (ref 0.0–0.1)
Basophils Relative: 1 %
Eosinophils Absolute: 0.2 10*3/uL (ref 0.0–0.5)
Eosinophils Relative: 3 %
HCT: 31.3 % — ABNORMAL LOW (ref 39.0–52.0)
Hemoglobin: 10.2 g/dL — ABNORMAL LOW (ref 13.0–17.0)
Immature Granulocytes: 0 %
Lymphocytes Relative: 25 %
Lymphs Abs: 1.9 10*3/uL (ref 0.7–4.0)
MCH: 26.6 pg (ref 26.0–34.0)
MCHC: 32.6 g/dL (ref 30.0–36.0)
MCV: 81.7 fL (ref 80.0–100.0)
Monocytes Absolute: 0.8 10*3/uL (ref 0.1–1.0)
Monocytes Relative: 11 %
Neutro Abs: 4.6 10*3/uL (ref 1.7–7.7)
Neutrophils Relative %: 60 %
Platelets: 261 10*3/uL (ref 150–400)
RBC: 3.83 MIL/uL — ABNORMAL LOW (ref 4.22–5.81)
RDW: 15.9 % — ABNORMAL HIGH (ref 11.5–15.5)
WBC: 7.6 10*3/uL (ref 4.0–10.5)
nRBC: 0 % (ref 0.0–0.2)

## 2022-12-31 LAB — URINALYSIS, ROUTINE W REFLEX MICROSCOPIC
Bacteria, UA: NONE SEEN
Bilirubin Urine: NEGATIVE
Glucose, UA: >=500 mg/dL — AB
Hgb urine dipstick: NEGATIVE
Ketones, ur: NEGATIVE mg/dL
Leukocytes,Ua: NEGATIVE
Nitrite: NEGATIVE
Protein, ur: >=300 mg/dL — AB
Specific Gravity, Urine: 1.012 (ref 1.005–1.030)
pH: 8 (ref 5.0–8.0)

## 2022-12-31 LAB — CBG MONITORING, ED: Glucose-Capillary: 366 mg/dL — ABNORMAL HIGH (ref 70–99)

## 2022-12-31 LAB — COMPREHENSIVE METABOLIC PANEL
ALT: 13 U/L (ref 0–44)
AST: 16 U/L (ref 15–41)
Albumin: 3.5 g/dL (ref 3.5–5.0)
Alkaline Phosphatase: 63 U/L (ref 38–126)
Anion gap: 11 (ref 5–15)
BUN: 32 mg/dL — ABNORMAL HIGH (ref 8–23)
CO2: 22 mmol/L (ref 22–32)
Calcium: 6.9 mg/dL — ABNORMAL LOW (ref 8.9–10.3)
Chloride: 99 mmol/L (ref 98–111)
Creatinine, Ser: 5.5 mg/dL — ABNORMAL HIGH (ref 0.61–1.24)
GFR, Estimated: 10 mL/min — ABNORMAL LOW (ref >60)
Glucose, Bld: 357 mg/dL — ABNORMAL HIGH (ref 70–99)
Potassium: 3.7 mmol/L (ref 3.5–5.1)
Sodium: 132 mmol/L — ABNORMAL LOW (ref 135–145)
Total Bilirubin: 0.4 mg/dL (ref 0.3–1.2)
Total Protein: 7.3 g/dL (ref 6.5–8.1)

## 2022-12-31 LAB — RAPID URINE DRUG SCREEN, HOSP PERFORMED
Amphetamines: NOT DETECTED
Barbiturates: NOT DETECTED
Benzodiazepines: NOT DETECTED
Cocaine: NOT DETECTED
Opiates: NOT DETECTED
Tetrahydrocannabinol: NOT DETECTED

## 2022-12-31 LAB — TROPONIN I (HIGH SENSITIVITY)
Troponin I (High Sensitivity): 10 ng/L (ref <18)
Troponin I (High Sensitivity): 11 ng/L (ref <18)

## 2022-12-31 MED ORDER — MORPHINE SULFATE (PF) 2 MG/ML IV SOLN: 2.0000 mg | INTRAVENOUS | Status: DC | PRN

## 2022-12-31 MED ORDER — INSULIN ASPART 100 UNIT/ML IJ SOLN
0.0000 [IU] | Freq: Three times a day (TID) | INTRAMUSCULAR | Status: DC
  Administered 2023-01-01: 8 [IU] via SUBCUTANEOUS
  Administered 2023-01-01: 11 [IU] via SUBCUTANEOUS

## 2022-12-31 MED ORDER — PANTOPRAZOLE SODIUM 40 MG PO TBEC
40.0000 mg | DELAYED_RELEASE_TABLET | Freq: Every day | ORAL | Status: DC
  Administered 2023-01-01 – 2023-01-02 (×2): 40 mg via ORAL
  Filled 2022-12-31 (×2): qty 1

## 2022-12-31 MED ORDER — ACETAMINOPHEN 325 MG PO TABS: 650.0000 mg | ORAL_TABLET | Freq: Four times a day (QID) | ORAL | Status: DC | PRN

## 2022-12-31 MED ORDER — ACETAMINOPHEN 650 MG RE SUPP: 650.0000 mg | Freq: Four times a day (QID) | RECTAL | Status: DC | PRN

## 2022-12-31 MED ORDER — HEPARIN SODIUM (PORCINE) 5000 UNIT/ML IJ SOLN
5000.0000 [IU] | Freq: Three times a day (TID) | INTRAMUSCULAR | Status: DC
  Administered 2023-01-01 – 2023-01-02 (×3): 5000 [IU] via SUBCUTANEOUS
  Filled 2022-12-31 (×5): qty 1

## 2022-12-31 MED ORDER — ONDANSETRON HCL 4 MG PO TABS: 4.0000 mg | ORAL_TABLET | Freq: Four times a day (QID) | ORAL | Status: DC | PRN

## 2022-12-31 MED ORDER — ONDANSETRON HCL 4 MG/2ML IJ SOLN: 4.0000 mg | Freq: Four times a day (QID) | INTRAMUSCULAR | Status: DC | PRN

## 2022-12-31 MED ORDER — INSULIN GLARGINE-YFGN 100 UNIT/ML ~~LOC~~ SOLN
6.0000 [IU] | Freq: Every day | SUBCUTANEOUS | Status: DC
  Administered 2023-01-01: 6 [IU] via SUBCUTANEOUS
  Filled 2022-12-31 (×3): qty 0.06

## 2022-12-31 MED ORDER — ATORVASTATIN CALCIUM 40 MG PO TABS
80.0000 mg | ORAL_TABLET | Freq: Every day | ORAL | Status: DC
  Administered 2023-01-01: 80 mg via ORAL
  Filled 2022-12-31: qty 2

## 2022-12-31 MED ORDER — HYDRALAZINE HCL 25 MG PO TABS
150.0000 mg | ORAL_TABLET | Freq: Two times a day (BID) | ORAL | Status: DC
  Administered 2022-12-31 – 2023-01-02 (×4): 150 mg via ORAL
  Filled 2022-12-31 (×4): qty 6

## 2022-12-31 MED ORDER — CARVEDILOL 3.125 MG PO TABS
6.2500 mg | ORAL_TABLET | Freq: Two times a day (BID) | ORAL | Status: DC
  Administered 2022-12-31 – 2023-01-02 (×4): 6.25 mg via ORAL
  Filled 2022-12-31 (×4): qty 2

## 2022-12-31 MED ORDER — OXYCODONE HCL 5 MG PO TABS: 5.0000 mg | ORAL_TABLET | ORAL | Status: DC | PRN

## 2022-12-31 MED ORDER — INSULIN ASPART 100 UNIT/ML IJ SOLN: 0.0000 [IU] | Freq: Every day | INTRAMUSCULAR | Status: DC

## 2022-12-31 MED ORDER — ASPIRIN 81 MG PO TBEC
81.0000 mg | DELAYED_RELEASE_TABLET | Freq: Every day | ORAL | Status: DC
  Administered 2023-01-01 – 2023-01-02 (×2): 81 mg via ORAL
  Filled 2022-12-31 (×2): qty 1

## 2022-12-31 MED ORDER — DILTIAZEM HCL ER COATED BEADS 240 MG PO CP24
240.0000 mg | ORAL_CAPSULE | Freq: Two times a day (BID) | ORAL | Status: DC
  Administered 2022-12-31 – 2023-01-02 (×4): 240 mg via ORAL
  Filled 2022-12-31 (×4): qty 1

## 2022-12-31 NOTE — ED Provider Notes (Signed)
[facility] Provider Note  CSN: [id number] Arrival date & time: 12/31/22 1910  Chief Complaint(s) Altered Mental Status  HPI Omar Parker is a 77 y.o. male ***   Past Medical History Past Medical History:  Diagnosis Date   Anemia    IDA   Diabetes mellitus without complication (HCC)    GERD (gastroesophageal reflux disease)    High cholesterol    Hypertension    Stage 4 chronic kidney disease ([place])    Patient Active Problem List   Diagnosis Date Noted   Acute CVA (cerebrovascular accident) ([place]) 04/07/2022   ESRD on dialysis ([place]) 04/06/2022   Vertigo 04/06/2022   Focal neurological deficit 04/06/2022   TIA (transient ischemic attack) 04/06/2022   CVA (cerebral vascular accident) ([place]) 04/06/2022   Lower GI bleeding 04/09/2020   CKD (chronic kidney disease), stage V ([place]) 04/09/2020   Acute blood loss anemia 04/09/2020   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 Authorizing Provider  acetaminophen (TYLENOL) 325 MG tablet Take 650 mg by mouth every 6 (six) hours as needed for moderate pain.     [provider]  aspirin EC 81 MG tablet Take 1 tablet (81 mg total) by mouth daily. Swallow whole. 04/08/22   Johnson, Clanford L, MD  atorvastatin (LIPITOR) 80 MG tablet Take 1 tablet (80 mg total) by mouth daily at 6 PM. 04/07/22   Johnson, Clanford L, MD  carvedilol (COREG) 3.125 MG tablet Take 2 tablets (6.25 mg total) by mouth 2 (two) times daily  with a meal. 04/08/22 05/08/22  Johnson, Clanford L, MD  diltiazem (CARDIZEM CD) 240 MG 24 hr capsule Take 1 capsule (240 mg total) by mouth 2 (two) times daily. 04/08/22   Johnson, Clanford L, MD  furosemide (LASIX) 20 MG tablet Take 20 mg by mouth daily.    [provider]  hydrALAZINE (APRESOLINE) 50 MG tablet Take 3 tablets (150 mg total) by mouth 2 (two) times daily. 04/07/22 05/07/22  Johnson, Clanford L, MD  insulin glargine (LANTUS) 100 UNIT/ML injection Inject 0.06 mLs (6 Units total) into the skin 2 (two) times daily. 04/07/22   Johnson, Clanford L, MD  pantoprazole (PROTONIX) 40 MG tablet Take 40 mg by mouth daily.     [provider]  Past Surgical History Past Surgical History:  Procedure Laterality Date   AV FISTULA PLACEMENT Left 06/28/2020   Procedure: LEFT UPPER ARM ARTERIOVENOUS (AV) GORE-TEX GRAFT;  Surgeon: Elam Dutch, MD;  Location: [facility];  Service: Vascular;  Laterality: Left;   COLONOSCOPY N/A 05/16/2017   Procedure: COLONOSCOPY;  Surgeon: Rogene Houston, MD;  [facility];  Service: Endoscopy;  Laterality: N/A;  7:30   COLONOSCOPY N/A 04/10/2020   Procedure: COLONOSCOPY;  Surgeon: Daneil Dolin, MD;  [facility];  Service: Endoscopy;  Laterality: N/A;   ESOPHAGOGASTRODUODENOSCOPY N/A 04/10/2020   Procedure: ESOPHAGOGASTRODUODENOSCOPY (EGD);  Surgeon: Daneil Dolin, MD;  [facility];  Service: Endoscopy;  Laterality: N/A;   HERNIA REPAIR     INSERTION OF DIALYSIS CATHETER  06/28/2020   Procedure: INSERTION OF DIALYSIS CATHETER;  Surgeon: Elam Dutch, MD;  Location[facility] OR;  Service: Vascular;;   IR REMOVAL TUN CV CATH W/O FL  09/21/2020   POLYPECTOMY  04/10/2020   Procedure: POLYPECTOMY;  Surgeon: Daneil Dolin, MD;  [facility];  Service: Endoscopy;;  cecal;   Family  History Family History  Problem Relation Age of Onset   Chronic Renal Failure Neg Hx     Social History Social History   Tobacco Use   Smoking status: Never   Smokeless tobacco: Never  Vaping Use   Vaping Use: Never used  Substance Use Topics   Alcohol use: Not Currently    Comment: no h/o heavy use   Drug use: Not Currently    Types: Marijuana    Comment: none in 20 years   Allergies Ace inhibitors  Review of Systems Review of Systems *** Physical Exam Vital Signs  I have reviewed the triage vital signs BP (!) 221/56 (BP Location: Right Arm)   Pulse 77   Temp 97.8 F (36.6 C) (Oral)   Resp 15   Ht '5\' 11"'$  (1.803 m)   Wt 77.6 kg   SpO2 99%   BMI 23.85 kg/m  *** Physical Exam  ED Results and Treatments Labs (all labs ordered are listed, but only abnormal results are displayed) Labs Reviewed - No data to display                                                                                                                        Radiology No results found.  Pertinent labs & imaging results that were available during my care of the patient were reviewed by me and considered in my medical decision making (see MDM for details).  Medications Ordered in ED Medications - No data to display  Procedures Procedures  (including critical care time)  Medical Decision Making / ED Course   This patient presents to the ED for concern of ***, this involves an extensive number of treatment options, and is a complaint that carries with it a high risk of complications and morbidity.  The differential diagnosis includes ***  MDM: ***   Additional history obtained: -Additional history obtained from *** -External records from outside source obtained and reviewed including: Chart review including previous notes, labs, imaging, consultation  notes   Lab Tests: -I ordered, reviewed, and interpreted labs.   The pertinent results include:   Labs Reviewed - No data to display    EKG ***  EKG Interpretation  Date/Time:    Ventricular Rate:    PR Interval:    QRS Duration:   QT Interval:    QTC Calculation:   R Axis:     Text Interpretation:           Imaging Studies ordered: I ordered imaging studies including *** I independently visualized and interpreted imaging. I agree with the radiologist interpretation   Medicines ordered and prescription drug management: No orders of the defined types were placed in this encounter.   -I have reviewed the patients home medicines and have made adjustments as needed  Critical interventions ***  Consultations Obtained: I requested consultation with the ***,  and discussed lab and imaging findings as well as pertinent plan - they recommend: ***   Cardiac Monitoring: The patient was maintained on a cardiac monitor.  I personally viewed and interpreted the cardiac monitored which showed an underlying rhythm of: ***  Social Determinants of Health:  Factors impacting patients care include: ***   Reevaluation: After the interventions noted above, I reevaluated the patient and found that they have :{resolved/improved/worsened:23923::"improved"}  Co morbidities that complicate the patient evaluation  Past Medical History:  Diagnosis Date   Anemia    IDA   Diabetes mellitus without complication (HCC)    GERD (gastroesophageal reflux disease)    High cholesterol    Hypertension    Stage 4 chronic kidney disease ([place])       Dispostion: I considered admission for this patient, ***     Final Clinical Impression(s) / ED Diagnoses Final diagnoses:  None     '@PCDICTATION'$ @

## 2022-12-31 NOTE — Consult Note (Signed)
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 In the ED, his SBP was 221 and blood glucose was 357. CT head was unremarkable. Suspect seizure although TIA or small stroke cannot be excluded. Other differential includes PRS given pt's elevated BP. PT is back to baseline, and so he was not a candidate for iv-thrombolytics or mechanical thrombectomy. Since this was pt's first seizure and is in the setting of an elevated glucose and hypertension, would hold antiepileptics for now. WOuld obtain and MRI brain without contrast to rule out stroke and MRA head and neck without contrast as well as a routine EEG. Can increase ASA to 325 mg for now. Can treat any BP > 200/110.   Recommendations: Our recommendations are outlined below.  Diagnostic Studies : MRI head with and without contrast Please order MRA head without contrast Please order MRA neck without contrast contrast-please order ROutine EEG  Laboratory Studies : Lipid panel Please order Hemoglobin A1c Please order  Antithrombotic Medication : Aspirin 325 mg daily  Nursing Recommendations : IV Fluids, avoid dextrose containing fluids, Maintain euglycemiaNeuro checks q4 hrs x 24 hrs and then per shiftHead of bed 30 degreesContinue with Telemetry  Consultations : Recommend Speech therapy if failed dysphagia screenPhysical therapy/Occupational therapy  Disposition : Neurology will  follow   ----------------------------------------------------------------------------------------------------   Advanced Imaging: Advanced Imaging Deferred because:  Non-disabling symptoms as verified by the patient; no cortical signs so not consistent with LVO  Stroke not suspected with clinical presentation and exam    Metrics: [facility] Notification Time: 12/31/2022 20:25:03 Stamp Time: 12/31/2022 20:26:16 Callback Response Time: 12/31/2022 20:32:48  Primary Provider Notified of Diagnostic Impression and Management Plan on: 12/31/2022 22:11:31   CT HEAD: As Per Radiologist CT Head Showed No Acute Hemorrhage or Acute Core Infarct   Labs Na 132, GLucose 357   ----------------------------------------------------------------------------------------------------  Chief Complaint: Difficulty speaking, seizure-like activity  History of Present Illness: Patient is a 77 year old Male. 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 PT was sitting at dinner table tonight with his wife at about 18:00, when he all of a sudden had an episode of word finding difficulty. His head then fell back with his eyes rolled to the right with lip smacking, & upper and lower extremity shaking lasting 2-3 minutes. Pt was back to baseline immediately with no post-ictal state, no tongue biting or urinary incontinence. HE states he was conscious during the seizure-like episode. In the ED, his SBP was 221 and blood glucose was 357. PT has no history of seizures, head trauma.    Past Medical History:      Hypertension      Diabetes Mellitus      Hyperlipidemia      Stroke      There is no history of Atrial Fibrillation      There  is no history of Coronary Artery Disease Other PMH:  ESRD on hemodialysis  Medications:  No Anticoagulant use  Antiplatelet use: Yes ASA  81 mg daily Reviewed EMR for current medications  Allergies:  Reviewed Description: ACE inhibitors  Social History: Smoking: No  Family History:  There is no family history of premature cerebrovascular disease pertinent to this consultation  ROS : 14 Points Review of Systems was performed and was negative except mentioned in HPI.  Past Surgical History: There Is No Surgical History Contributory To Today's Visit    Examination: BP(221/56), Pulse(72), Blood Glucose(357) 1A: Level of Consciousness - Alert; keenly responsive + 0 1B: Ask Month and Age - Both Questions Right + 0 1C: Blink Eyes & Squeeze Hands - Performs Both Tasks + 0 2: Test Horizontal Extraocular Movements - Normal + 0 3: Test Visual Fields - No Visual Loss + 0 4: Test Facial Palsy (Use Grimace if Obtunded) - Normal symmetry + 0 5A: Test Left Arm Motor Drift - No Drift for 10 Seconds + 0 5B: Test Right Arm Motor Drift - No Drift for 10 Seconds + 0 6A: Test Left Leg Motor Drift - No Drift for 5 Seconds + 0 6B: Test Right Leg Motor Drift - No Drift for 5 Seconds + 0 7: Test Limb Ataxia (FNF/Heel-Shin) - No Ataxia + 0 8: Test Sensation - Normal; No sensory loss + 0 9: Test Language/Aphasia - Normal; No aphasia + 0 10: Test Dysarthria - Normal + 0 11: Test Extinction/Inattention - No abnormality + 0  NIHSS Score: 0  Spoke with : DR. [place]    Patient / Family was informed the Neurology Consult would occur via TeleHealth consult by way of interactive audio and video telecommunications and consented to receiving care in this manner.  Patient is being evaluated for possible acute neurologic impairment and high probability of imminent or life - threatening deterioration.I spent total of 25 minutes providing care to this patient, including time for face to face visit via telemedicine, review of medical records, imaging studies and discussion of findings with providers, the patient and / or family.   Dr  Sherri Rad   [facility] For Inpatient follow-up with [facility] physician please call [facility] [phone]. This is not an outpatient service. Post hospital discharge, please contact hospital directly.  Please do not communicate with [facility] physicians via secure chat. If you have any questions, Please contact [facility]. Please call or reconsult our service if there are any clinical or diagnostic changes.

## 2022-12-31 NOTE — ED Notes (Signed)
Patient transported to CT 

## 2022-12-31 NOTE — ED Triage Notes (Signed)
Pt arrived by [facility] for episode of difficulty speaking. EMS called around 1950; family reported pt was talking then could not get his words out, also had stiffness in arms. On EMS arrival, symptoms resolved. Pt is a Tues, Thursday, Saturday dialysis patient; last treatment on Saturday. Denies any other stroke symptoms. On arrival, NIH 0; 20g IV to R AC, fistula to L upper arm

## 2022-12-31 NOTE — ED Notes (Signed)
Teleneuro at bedside 

## 2022-12-31 NOTE — ED Notes (Signed)
ED TO INPATIENT HANDOFF REPORT  ED Nurse Name and Phone #: [place] RN   S Name/Age/Gender Omar Parker 77 y.o. male Room/Bed: APA11/APA11  Code Status   Code Status: Full Code  Home/SNF/Other Home Patient oriented to: self, place, time, and situation Is this baseline? Yes   Triage Complete: Triage complete  Chief Complaint Acute metabolic encephalopathy 99991111  Triage Note Pt arrived by [facility] for episode of difficulty speaking. EMS called around 1950; family reported pt was talking then could not get his words out, also had stiffness in arms. On EMS arrival, symptoms resolved. Pt is a Tues, Thursday, Saturday dialysis patient; last treatment on Saturday. Denies any other stroke symptoms. On arrival, NIH 0; 20g IV to R AC, fistula to L upper arm    Allergies Allergies  Allergen Reactions   Ace Inhibitors Swelling    Level of Care/Admitting Diagnosis ED Disposition     ED Disposition  Admit   Condition  --   [place]: [facility] [id number]  Level of Care: Telemetry [5]  Covid Evaluation: Asymptomatic - no recent exposure (last 10 days) testing not required  Diagnosis: Acute metabolic encephalopathy [id number]  Admitting Physician: Rolla Plate [id number]  Attending Physician: Rolla Plate [id number]          B Medical/Surgery History Past Medical History:  Diagnosis Date   Anemia    IDA   Diabetes mellitus without complication (HCC)    GERD (gastroesophageal reflux disease)    High cholesterol    Hypertension    Stage 4 chronic kidney disease (HCC)    Past Surgical History:  Procedure Laterality Date   AV FISTULA PLACEMENT Left 06/28/2020   Procedure: LEFT UPPER ARM ARTERIOVENOUS (AV) GORE-TEX GRAFT;  Surgeon: Elam Dutch, MD;  Location: [place];  Service: Vascular;  Laterality: Left;   COLONOSCOPY N/A 05/16/2017   Procedure: COLONOSCOPY;  Surgeon: Rogene Houston, MD;  [facility];  Service:  Endoscopy;  Laterality: N/A;  7:30   COLONOSCOPY N/A 04/10/2020   Procedure: COLONOSCOPY;  Surgeon: Daneil Dolin, MD;  [facility];  Service: Endoscopy;  Laterality: N/A;   ESOPHAGOGASTRODUODENOSCOPY N/A 04/10/2020   Procedure: ESOPHAGOGASTRODUODENOSCOPY (EGD);  Surgeon: Daneil Dolin, MD;  [facility];  Service: Endoscopy;  Laterality: N/A;   HERNIA REPAIR     INSERTION OF DIALYSIS CATHETER  06/28/2020   Procedure: INSERTION OF DIALYSIS CATHETER;  Surgeon: Elam Dutch, MD;  Location: [facility] OR;  Service: Vascular;;   IR REMOVAL TUN CV CATH W/O FL  09/21/2020   POLYPECTOMY  04/10/2020   Procedure: POLYPECTOMY;  Surgeon: Daneil Dolin, MD;  [facility];  Service: Endoscopy;;  cecal;     A IV Location/Drains/Wounds Patient Lines/Drains/Airways Status     Active Line/Drains/Airways     Name Placement date Placement time Site Days   Peripheral IV 12/31/22 20 G Right Antecubital 12/31/22  1928  Antecubital  less than 1   Fistula / Graft Left Upper arm Arteriovenous vein graft 06/28/20  1354  Upper arm  916            Intake/Output Last 24 hours No intake or output data in the 24 hours ending 12/31/22 2242  Labs/Imaging Results for orders placed or performed during the hospital encounter of 12/31/22 (from the past 48 hour(s))  Comprehensive metabolic panel     Status: Abnormal   Collection Time: 12/31/22  7:27 PM  Result Value  Ref Range   Sodium 132 (L) 135 - 145 mmol/L   Potassium 3.7 3.5 - 5.1 mmol/L   Chloride 99 98 - 111 mmol/L   CO2 22 22 - 32 mmol/L   Glucose, Bld 357 (H) 70 - 99 mg/dL    Comment: Glucose reference range applies only to samples taken after fasting for at least 8 hours.   BUN 32 (H) 8 - 23 mg/dL   Creatinine, Ser 5.50 (H) 0.61 - 1.24 mg/dL   Calcium 6.9 (L) 8.9 - 10.3 mg/dL   Total Protein 7.3 6.5 - 8.1 g/dL   Albumin 3.5 3.5 - 5.0 g/dL   AST 16 15 - 41 U/L   ALT 13 0 - 44 U/L   Alkaline Phosphatase 63 38 - 126  U/L   Total Bilirubin 0.4 0.3 - 1.2 mg/dL   GFR, Estimated 10 (L) >60 mL/min    Comment: (NOTE) Calculated using the CKD-EPI Creatinine Equation (2021)    Anion gap 11 5 - 15    Comment: Performed at [facility], [address], [place], [place] [address]  Troponin I (High Sensitivity)     Status: None   Collection Time: 12/31/22  7:27 PM  Result Value Ref Range   Troponin I (High Sensitivity) 10 <18 ng/L    Comment: (NOTE) Elevated high sensitivity troponin I (hsTnI) values and significant  changes across serial measurements may suggest ACS but many other  chronic and acute conditions are known to elevate hsTnI results.  Refer to the "Links" section for chest pain algorithms and additional  guidance. Performed at [facility], [address] [place], [place] [address]   CBC with Differential     Status: Abnormal   Collection Time: 12/31/22  7:27 PM  Result Value Ref Range   WBC 7.6 4.0 - 10.5 K/uL   RBC 3.83 (L) 4.22 - 5.81 MIL/uL   Hemoglobin 10.2 (L) 13.0 - 17.0 g/dL   HCT 31.3 (L) 39.0 - 52.0 %   MCV 81.7 80.0 - 100.0 fL   MCH 26.6 26.0 - 34.0 pg   MCHC 32.6 30.0 - 36.0 g/dL   RDW 15.9 (H) 11.5 - 15.5 %   Platelets 261 150 - 400 K/uL   nRBC 0.0 0.0 - 0.2 %   Neutrophils Relative % 60 %   Neutro Abs 4.6 1.7 - 7.7 K/uL   Lymphocytes Relative 25 %   Lymphs Abs 1.9 0.7 - 4.0 K/uL   Monocytes Relative 11 %   Monocytes Absolute 0.8 0.1 - 1.0 K/uL   Eosinophils Relative 3 %   Eosinophils Absolute 0.2 0.0 - 0.5 K/uL   Basophils Relative 1 %   Basophils Absolute 0.1 0.0 - 0.1 K/uL   Immature Granulocytes 0 %   Abs Immature Granulocytes 0.02 0.00 - 0.07 K/uL    Comment: Performed at [facility], [address] [place], [place] [address]  CBG monitoring, ED     Status: Abnormal   Collection Time: 12/31/22  7:27 PM  Result Value Ref Range   Glucose-Capillary 366 (H) 70 - 99 mg/dL    Comment: Glucose reference range applies only to samples taken after fasting for at least 8  hours.  Urinalysis, Routine w reflex microscopic -Urine, Clean Catch     Status: Abnormal   Collection Time: 12/31/22  8:01 PM  Result Value Ref Range   Color, Urine STRAW (A) YELLOW   APPearance CLEAR CLEAR   Specific Gravity, Urine 1.012 1.005 - 1.030  pH 8.0 5.0 - 8.0   Glucose, UA >=500 (A) NEGATIVE mg/dL   Hgb urine dipstick NEGATIVE NEGATIVE   Bilirubin Urine NEGATIVE NEGATIVE   Ketones, ur NEGATIVE NEGATIVE mg/dL   Protein, ur >=300 (A) NEGATIVE mg/dL   Nitrite NEGATIVE NEGATIVE   Leukocytes,Ua NEGATIVE NEGATIVE   RBC / HPF 0-5 0 - 5 RBC/hpf   WBC, UA 0-5 0 - 5 WBC/hpf   Bacteria, UA NONE SEEN NONE SEEN   Squamous Epithelial / HPF 0-5 0 - 5 /HPF   Hyaline Casts, UA PRESENT     Comment: Performed at [facility], [address], [place], [place] [address]  Rapid urine drug screen (hospital performed)     Status: None   Collection Time: 12/31/22  8:01 PM  Result Value Ref Range   Opiates NONE DETECTED NONE DETECTED   Cocaine NONE DETECTED NONE DETECTED   Benzodiazepines NONE DETECTED NONE DETECTED   Amphetamines NONE DETECTED NONE DETECTED   Tetrahydrocannabinol NONE DETECTED NONE DETECTED   Barbiturates NONE DETECTED NONE DETECTED    Comment: (NOTE) DRUG SCREEN FOR MEDICAL PURPOSES ONLY.  IF CONFIRMATION IS NEEDED FOR ANY PURPOSE, NOTIFY LAB WITHIN 5 DAYS.  LOWEST DETECTABLE LIMITS FOR URINE DRUG SCREEN Drug Class                     Cutoff (ng/mL) Amphetamine and metabolites    1000 Barbiturate and metabolites    200 Benzodiazepine                 200 Opiates and metabolites        300 Cocaine and metabolites        300 THC                            50 Performed at [facility], [address], [place], [place] [address]   Troponin I (High Sensitivity)     Status: None   Collection Time: 12/31/22  9:25 PM  Result Value Ref Range   Troponin I (High Sensitivity) 11 <18 ng/L    Comment: (NOTE) Elevated high sensitivity troponin I (hsTnI) values and significant   changes across serial measurements may suggest ACS but many other  chronic and acute conditions are known to elevate hsTnI results.  Refer to the "Links" section for chest pain algorithms and additional  guidance. Performed at [facility], [address] [place],  [address]    CT Head Wo Contrast  Result Date: 12/31/2022 CLINICAL DATA:  Delirium EXAM: CT HEAD WITHOUT CONTRAST TECHNIQUE: Contiguous axial images were obtained from the base of the skull through the vertex without intravenous contrast. RADIATION DOSE REDUCTION: This exam was performed according to the departmental dose-optimization program which includes automated exposure control, adjustment of the mA and/or kV according to patient size and/or use of iterative reconstruction technique. COMPARISON:  April 06, 2022 FINDINGS: Brain: No evidence of acute infarction, hemorrhage, hydrocephalus, extra-axial collection or mass lesion/mass effect. Periventricular white matter hypodensities consistent with sequela of chronic microvascular ischemic disease. Remote LEFT pontine infarction. Vascular: Vascular calcifications. Skull: No acute fracture. 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 Sinuses/Orbits: Chronic LEFT maxillary sinusitis. Chronic LEFT mastoid effusion. Other: None IMPRESSION: 1. No acute intracranial abnormality. Electronically Signed   By: Valentino Saxon M.D.   On: 12/31/2022 20:04    Pending Labs Unresulted Labs (From admission, onward)     Start     Ordered  Signed and Held  Hemoglobin A1c  (Glycemic Control (SSI)  Q 4 Hours / Glycemic Control (SSI)  AC +/- HS)  Tomorrow morning,   R       Comments: To assess prior glycemic control    Signed and Held   Signed and Held  Comprehensive metabolic panel  Tomorrow morning,   R        Signed and Held   Signed and Held  Magnesium  Tomorrow morning,   R        Signed and Held   Signed and Held  CBC with Differential/Platelet  Tomorrow morning,   R        Signed  and Held   Signed and Held  TSH  Tomorrow morning,   R        Signed and Held            Vitals/Pain Today's Vitals   12/31/22 2000 12/31/22 2030 12/31/22 2130 12/31/22 2200  BP: (!) 221/96 (!) 210/77 (!) 182/73 (!) 193/90  Pulse:   72   Resp: '17 11 20 19  '$ Temp:      TempSrc:      SpO2:   100%   Weight:      Height:      PainSc:        Isolation Precautions No active isolations  Medications Medications  carvedilol (COREG) tablet 6.25 mg (6.25 mg Oral Given 12/31/22 2045)  diltiazem (CARDIZEM CD) 24 hr capsule 240 mg (240 mg Oral Given 12/31/22 2045)  hydrALAZINE (APRESOLINE) tablet 150 mg (150 mg Oral Given 12/31/22 2045)    Mobility walks     Focused Assessments Neuro Assessment Handoff:  Swallow screen pass? Yes    NIH Stroke Scale  Dizziness Present: No Headache Present: No Interval: Initial Level of Consciousness (1a.)   : Alert, keenly responsive LOC Questions (1b. )   : Answers both questions correctly LOC Commands (1c. )   : Performs both tasks correctly Best Gaze (2. )  : Normal Visual (3. )  : No visual loss Facial Palsy (4. )    : Normal symmetrical movements Motor Arm, Left (5a. )   : No drift Motor Arm, Right (5b. ) : No drift Motor Leg, Left (6a. )  : No drift Motor Leg, Right (6b. ) : No drift Limb Ataxia (7. ): Absent Sensory (8. )  : Normal, no sensory loss Best Language (9. )  : No aphasia Dysarthria (10. ): Normal Extinction/Inattention (11.)   : No Abnormality Complete NIHSS TOTAL: 0 Last date known well: 12/31/22 Last time known well: 1945 Neuro Assessment: Exceptions to [facility] Neuro Checks:   Initial (12/31/22 1930)  Has TPA been given? No If patient is a Neuro Trauma and patient is going to OR before floor call report to [place] nurse: [phone] or [phone]   R Recommendations: See Admitting Provider Note  Report given to:   Additional Notes:

## 2023-01-01 ENCOUNTER — Observation Stay (HOSPITAL_COMMUNITY): Payer: Medicare PPO

## 2023-01-01 ENCOUNTER — Observation Stay (HOSPITAL_COMMUNITY)
Admit: 2023-01-01 | Discharge: 2023-01-01 | Disposition: A | Discharge status: Home / Self Care | Payer: Medicare PPO | Attending: Family Medicine | Admitting: Family Medicine

## 2023-01-01 DIAGNOSIS — D631 Anemia in chronic kidney disease: Secondary | ICD-10-CM | POA: Diagnosis not present

## 2023-01-01 DIAGNOSIS — I169 Hypertensive crisis, unspecified: Secondary | ICD-10-CM

## 2023-01-01 DIAGNOSIS — Z8673 Personal history of transient ischemic attack (TIA), and cerebral infarction without residual deficits: Secondary | ICD-10-CM

## 2023-01-01 DIAGNOSIS — E7800 Pure hypercholesterolemia, unspecified: Secondary | ICD-10-CM | POA: Diagnosis not present

## 2023-01-01 DIAGNOSIS — K219 Gastro-esophageal reflux disease without esophagitis: Secondary | ICD-10-CM | POA: Diagnosis not present

## 2023-01-01 DIAGNOSIS — Z794 Long term (current) use of insulin: Secondary | ICD-10-CM | POA: Diagnosis not present

## 2023-01-01 DIAGNOSIS — G319 Degenerative disease of nervous system, unspecified: Secondary | ICD-10-CM | POA: Diagnosis not present

## 2023-01-01 DIAGNOSIS — N250 Renal osteodystrophy: Secondary | ICD-10-CM | POA: Diagnosis not present

## 2023-01-01 DIAGNOSIS — R569 Unspecified convulsions: Secondary | ICD-10-CM

## 2023-01-01 DIAGNOSIS — Z992 Dependence on renal dialysis: Secondary | ICD-10-CM

## 2023-01-01 DIAGNOSIS — R4182 Altered mental status, unspecified: Secondary | ICD-10-CM | POA: Diagnosis not present

## 2023-01-01 DIAGNOSIS — N186 End stage renal disease: Secondary | ICD-10-CM

## 2023-01-01 DIAGNOSIS — G9341 Metabolic encephalopathy: Principal | ICD-10-CM

## 2023-01-01 DIAGNOSIS — G4089 Other seizures: Secondary | ICD-10-CM | POA: Diagnosis not present

## 2023-01-01 DIAGNOSIS — I6501 Occlusion and stenosis of right vertebral artery: Secondary | ICD-10-CM | POA: Diagnosis not present

## 2023-01-01 DIAGNOSIS — E1122 Type 2 diabetes mellitus with diabetic chronic kidney disease: Secondary | ICD-10-CM

## 2023-01-01 DIAGNOSIS — I120 Hypertensive chronic kidney disease with stage 5 chronic kidney disease or end stage renal disease: Secondary | ICD-10-CM | POA: Diagnosis not present

## 2023-01-01 LAB — COMPREHENSIVE METABOLIC PANEL
ALT: 11 U/L (ref 0–44)
AST: 15 U/L (ref 15–41)
Albumin: 3.1 g/dL — ABNORMAL LOW (ref 3.5–5.0)
Alkaline Phosphatase: 58 U/L (ref 38–126)
Anion gap: 12 (ref 5–15)
BUN: 32 mg/dL — ABNORMAL HIGH (ref 8–23)
CO2: 23 mmol/L (ref 22–32)
Calcium: 6.8 mg/dL — ABNORMAL LOW (ref 8.9–10.3)
Chloride: 98 mmol/L (ref 98–111)
Creatinine, Ser: 5.54 mg/dL — ABNORMAL HIGH (ref 0.61–1.24)
GFR, Estimated: 10 mL/min — ABNORMAL LOW (ref >60)
Glucose, Bld: 307 mg/dL — ABNORMAL HIGH (ref 70–99)
Potassium: 3.6 mmol/L (ref 3.5–5.1)
Sodium: 133 mmol/L — ABNORMAL LOW (ref 135–145)
Total Bilirubin: 0.4 mg/dL (ref 0.3–1.2)
Total Protein: 6.8 g/dL (ref 6.5–8.1)

## 2023-01-01 LAB — GLUCOSE, CAPILLARY
Glucose-Capillary: 272 mg/dL — ABNORMAL HIGH (ref 70–99)
Glucose-Capillary: 350 mg/dL — ABNORMAL HIGH (ref 70–99)
Glucose-Capillary: 135 mg/dL — ABNORMAL HIGH (ref 70–99)
Glucose-Capillary: 253 mg/dL — ABNORMAL HIGH (ref 70–99)

## 2023-01-01 LAB — CBC WITH DIFFERENTIAL/PLATELET
Abs Immature Granulocytes: 0.04 10*3/uL (ref 0.00–0.07)
Basophils Absolute: 0.1 10*3/uL (ref 0.0–0.1)
Basophils Relative: 1 %
Eosinophils Absolute: 0.2 10*3/uL (ref 0.0–0.5)
Eosinophils Relative: 2 %
HCT: 29.9 % — ABNORMAL LOW (ref 39.0–52.0)
Hemoglobin: 9.8 g/dL — ABNORMAL LOW (ref 13.0–17.0)
Immature Granulocytes: 1 %
Lymphocytes Relative: 19 %
Lymphs Abs: 1.5 10*3/uL (ref 0.7–4.0)
MCH: 26.6 pg (ref 26.0–34.0)
MCHC: 32.8 g/dL (ref 30.0–36.0)
MCV: 81.3 fL (ref 80.0–100.0)
Monocytes Absolute: 0.8 10*3/uL (ref 0.1–1.0)
Monocytes Relative: 10 %
Neutro Abs: 5.3 10*3/uL (ref 1.7–7.7)
Neutrophils Relative %: 67 %
Platelets: 244 10*3/uL (ref 150–400)
RBC: 3.68 MIL/uL — ABNORMAL LOW (ref 4.22–5.81)
RDW: 15.9 % — ABNORMAL HIGH (ref 11.5–15.5)
WBC: 7.8 10*3/uL (ref 4.0–10.5)
nRBC: 0 % (ref 0.0–0.2)

## 2023-01-01 LAB — MAGNESIUM: Magnesium: 1.5 mg/dL — ABNORMAL LOW (ref 1.7–2.4)

## 2023-01-01 LAB — TSH: TSH: 3.724 u[IU]/mL (ref 0.350–4.500)

## 2023-01-01 MED ORDER — HYDRALAZINE HCL 20 MG/ML IJ SOLN
10.0000 mg | Freq: Once | INTRAMUSCULAR | Status: AC
  Administered 2023-01-01: 10 mg via INTRAVENOUS
  Filled 2023-01-01: qty 1

## 2023-01-01 MED ORDER — INSULIN ASPART 100 UNIT/ML IJ SOLN
0.0000 [IU] | Freq: Every day | INTRAMUSCULAR | Status: DC
  Administered 2023-01-01: 3 [IU] via SUBCUTANEOUS

## 2023-01-01 MED ORDER — INSULIN ASPART 100 UNIT/ML IJ SOLN
0.0000 [IU] | Freq: Three times a day (TID) | INTRAMUSCULAR | Status: DC
  Administered 2023-01-02: 2 [IU] via SUBCUTANEOUS

## 2023-01-01 NOTE — Assessment & Plan Note (Signed)
Continue PPI ?

## 2023-01-01 NOTE — Care Management Obs Status (Signed)
[place] NOTIFICATION   Patient Details  Name: Omar Parker MRN: [id number] Date of Birth: 19-Aug-1946   Medicare Observation Status Notification Given:  Yes    Tommy Medal 01/01/2023, 4:11 PM

## 2023-01-01 NOTE — Procedures (Signed)
Patient Name: Omar Parker  MRN: [id number]  Epilepsy Attending: Lora Havens  Referring Physician/Provider: Rolla Plate, DO  Date: 01/01/2023 Duration: 27.46 mins  Patient history: 77yo m with seizure like episode. EEG to evaluate for seizure  Level of alertness: Awake, asleep  AEDs during EEG study: None  Technical aspects: This EEG study was done with scalp electrodes positioned according to the 10-20 International system of electrode placement. Electrical activity was reviewed with band pass filter of 1-'70Hz'$ , sensitivity of 7 uV/mm, display speed of 48m/sec with a '60Hz'$  notched filter applied as appropriate. EEG data were recorded continuously and digitally stored.  Video monitoring was available and reviewed as appropriate.  Description: The posterior dominant rhythm consists of 8 Hz activity of moderate voltage (25-35 uV) seen predominantly in posterior head regions, symmetric and reactive to eye opening and eye closing. Sleep was characterized by vertex waves, sleep spindles (12 to 14 Hz), maximal frontocentral region. EEG showed intermittent 3-'5hz'$  Hz theta-delta slowing in left temporal region.. Hyperventilation did not show any EEG change.  Physiologic photic driving was seen during photic stimulation.     ABNORMALITY - Intermittent slow, left temporal region  IMPRESSION: This study is suggestive of nonspecific cortical dysfunction arising from left temporal region. No seizures or epileptiform discharges were seen throughout the recording.  Please note that lack of epileptiform activity during interictal EEG does not exclude the diagnosis of epilepsy.  Aarib Pulido OBarbra Sarks

## 2023-01-01 NOTE — Progress Notes (Signed)
Pt refused blood glucose monitoring and insulin, stating he is tired of being 'stuck'. He agreed to call nurse to check his blood glucose if he feels symptoms of abnormal sugar levels.

## 2023-01-01 NOTE — Assessment & Plan Note (Addendum)
Continue statin. 

## 2023-01-01 NOTE — Consult Note (Signed)
Reason for Consult: To manage dialysis and dialysis related needs Referring Physician: [name] is an 77 y.o. male with DM, HTN, ESRD-  HD  TTS at [facility].  He does not appear to frequent the hospital.  He presented to [facility] ER early this AM with c/o sz like activity-  plan is to admit for sz/CVA work up.  We are asked to provide his routine HD.  He did attend Saturday and will be due tomorrow.  He is currently asymptomatic -  was hoping he could go home today but looks like it will be tomorrow    Dialyzes at [facility] TTS 4 hours  EDW 77.5. HD Bath 3/2.5, Dialyzer unknown, Heparin yes 1000 per hour. Access AVF-  400 BFR. Mircera low dose 30 q 4 weeks and venofer 50 weekly   Past Medical History:  Diagnosis Date   Anemia    IDA   Diabetes mellitus without complication (HCC)    GERD (gastroesophageal reflux disease)    High cholesterol    Hypertension    Stage 4 chronic kidney disease (HCC)     Past Surgical History:  Procedure Laterality Date   AV FISTULA PLACEMENT Left 06/28/2020   Procedure: LEFT UPPER ARM ARTERIOVENOUS (AV) GORE-TEX GRAFT;  Surgeon: Elam Dutch, MD;  Location: [place];  Service: Vascular;  Laterality: Left;   COLONOSCOPY N/A 05/16/2017   Procedure: COLONOSCOPY;  Surgeon: Rogene Houston, MD;  [facility];  Service: Endoscopy;  Laterality: N/A;  7:30   COLONOSCOPY N/A 04/10/2020   Procedure: COLONOSCOPY;  Surgeon: Daneil Dolin, MD;  [facility];  Service: Endoscopy;  Laterality: N/A;   ESOPHAGOGASTRODUODENOSCOPY N/A 04/10/2020   Procedure: ESOPHAGOGASTRODUODENOSCOPY (EGD);  Surgeon: Daneil Dolin, MD;  [facility];  Service: Endoscopy;  Laterality: N/A;   HERNIA REPAIR     INSERTION OF DIALYSIS CATHETER  06/28/2020   Procedure: INSERTION OF DIALYSIS CATHETER;  Surgeon: Elam Dutch, MD;  Location: [facility];  Service: Vascular;;   IR REMOVAL TUN CV CATH W/O FL  09/21/2020   POLYPECTOMY  04/10/2020    Procedure: POLYPECTOMY;  Surgeon: Daneil Dolin, MD;  [facility];  Service: Endoscopy;;  cecal;    Family History  Problem Relation Age of Onset   Chronic Renal Failure Neg Hx     Social History:  reports that he has never smoked. He has never used smokeless tobacco. He reports that he does not currently use alcohol. He reports that he does not currently use drugs after having used the following drugs: Marijuana.  Allergies:  Allergies  Allergen Reactions   Ace Inhibitors Swelling    Medications: I have reviewed the patient's current medications.  Results for orders placed or performed during the hospital encounter of 12/31/22 (from the past 48 hour(s))  Comprehensive metabolic panel     Status: Abnormal   Collection Time: 12/31/22  7:27 PM  Result Value Ref Range   Sodium 132 (L) 135 - 145 mmol/L   Potassium 3.7 3.5 - 5.1 mmol/L   Chloride 99 98 - 111 mmol/L   CO2 22 22 - 32 mmol/L   Glucose, Bld 357 (H) 70 - 99 mg/dL    Comment: Glucose reference range applies only to samples taken after fasting for at least 8 hours.   BUN 32 (H) 8 - 23 mg/dL   Creatinine, Ser 5.50 (H) 0.61 - 1.24 mg/dL   Calcium 6.9 (L) 8.9 -  10.3 mg/dL   Total Protein 7.3 6.5 - 8.1 g/dL   Albumin 3.5 3.5 - 5.0 g/dL   AST 16 15 - 41 U/L   ALT 13 0 - 44 U/L   Alkaline Phosphatase 63 38 - 126 U/L   Total Bilirubin 0.4 0.3 - 1.2 mg/dL   GFR, Estimated 10 (L) >60 mL/min    Comment: (NOTE) Calculated using the CKD-EPI Creatinine Equation (2021)    Anion gap 11 5 - 15    Comment: Performed at [facility], [address] [place], [place] [address]  Troponin I (High Sensitivity)     Status: None   Collection Time: 12/31/22  7:27 PM  Result Value Ref Range   Troponin I (High Sensitivity) 10 <18 ng/L    Comment: (NOTE) Elevated high sensitivity troponin I (hsTnI) values and significant  changes across serial measurements may suggest ACS but many other  chronic and acute conditions are known  to elevate hsTnI results.  Refer to the "Links" section for chest pain algorithms and additional  guidance. Performed at [facility], [address] [place] [address], [place], [place] [address]   CBC with Differential     Status: Abnormal   Collection Time: 12/31/22  7:27 PM  Result Value Ref Range   WBC 7.6 4.0 - 10.5 K/uL   RBC 3.83 (L) 4.22 - 5.81 MIL/uL   Hemoglobin 10.2 (L) 13.0 - 17.0 g/dL   HCT 31.3 (L) 39.0 - 52.0 %   MCV 81.7 80.0 - 100.0 fL   MCH 26.6 26.0 - 34.0 pg   MCHC 32.6 30.0 - 36.0 g/dL   RDW 15.9 (H) 11.5 - 15.5 %   Platelets 261 150 - 400 K/uL   nRBC 0.0 0.0 - 0.2 %   Neutrophils Relative % 60 %   Neutro Abs 4.6 1.7 - 7.7 K/uL   Lymphocytes Relative 25 %   Lymphs Abs 1.9 0.7 - 4.0 K/uL   Monocytes Relative 11 %   Monocytes Absolute 0.8 0.1 - 1.0 K/uL   Eosinophils Relative 3 %   Eosinophils Absolute 0.2 0.0 - 0.5 K/uL   Basophils Relative 1 %   Basophils Absolute 0.1 0.0 - 0.1 K/uL   Immature Granulocytes 0 %   Abs Immature Granulocytes 0.02 0.00 - 0.07 K/uL    Comment: Performed at [facility], [address], [place],  [address]  CBG monitoring, ED     Status: Abnormal   Collection Time: 12/31/22  7:27 PM  Result Value Ref Range   Glucose-Capillary 366 (H) 70 - 99 mg/dL    Comment: Glucose reference range applies only to samples taken after fasting for at least 8 hours.  Urinalysis, Routine w reflex microscopic -Urine, Clean Catch     Status: Abnormal   Collection Time: 12/31/22  8:01 PM  Result Value Ref Range   Color, Urine STRAW (A) YELLOW   APPearance CLEAR CLEAR   Specific Gravity, Urine 1.012 1.005 - 1.030   pH 8.0 5.0 - 8.0   Glucose, UA >=500 (A) NEGATIVE mg/dL   Hgb urine dipstick NEGATIVE NEGATIVE   Bilirubin Urine NEGATIVE NEGATIVE   Ketones, ur NEGATIVE NEGATIVE mg/dL   Protein, ur >=300 (A) NEGATIVE mg/dL   Nitrite NEGATIVE NEGATIVE   Leukocytes,Ua NEGATIVE NEGATIVE   RBC / HPF 0-5 0 - 5 RBC/hpf   WBC, UA 0-5 0 - 5 WBC/hpf   Bacteria, UA  NONE SEEN NONE SEEN   Squamous Epithelial / HPF 0-5 0 - 5 /HPF  Hyaline Casts, UA PRESENT     Comment: Performed at [facility], [address], [place], [place] [address]  Rapid urine drug screen (hospital performed)     Status: None   Collection Time: 12/31/22  8:01 PM  Result Value Ref Range   Opiates NONE DETECTED NONE DETECTED   Cocaine NONE DETECTED NONE DETECTED   Benzodiazepines NONE DETECTED NONE DETECTED   Amphetamines NONE DETECTED NONE DETECTED   Tetrahydrocannabinol NONE DETECTED NONE DETECTED   Barbiturates NONE DETECTED NONE DETECTED    Comment: (NOTE) DRUG SCREEN FOR MEDICAL PURPOSES ONLY.  IF CONFIRMATION IS NEEDED FOR ANY PURPOSE, NOTIFY LAB WITHIN 5 DAYS.  LOWEST DETECTABLE LIMITS FOR URINE DRUG SCREEN Drug Class                     Cutoff (ng/mL) Amphetamine and metabolites    1000 Barbiturate and metabolites    200 Benzodiazepine                 200 Opiates and metabolites        300 Cocaine and metabolites        300 THC                            50 Performed at [facility], [address], [place], [place] [address]   Troponin I (High Sensitivity)     Status: None   Collection Time: 12/31/22  9:25 PM  Result Value Ref Range   Troponin I (High Sensitivity) 11 <18 ng/L    Comment: (NOTE) Elevated high sensitivity troponin I (hsTnI) values and significant  changes across serial measurements may suggest ACS but many other  chronic and acute conditions are known to elevate hsTnI results.  Refer to the "Links" section for chest pain algorithms and additional  guidance. Performed at [facility], [address], [place], [place] [address]   Comprehensive metabolic panel     Status: Abnormal   Collection Time: 01/01/23  4:25 AM  Result Value Ref Range   Sodium 133 (L) 135 - 145 mmol/L   Potassium 3.6 3.5 - 5.1 mmol/L   Chloride 98 98 - 111 mmol/L   CO2 23 22 - 32 mmol/L   Glucose, Bld 307 (H) 70 - 99 mg/dL    Comment: Glucose reference range applies  only to samples taken after fasting for at least 8 hours.   BUN 32 (H) 8 - 23 mg/dL   Creatinine, Ser 5.54 (H) 0.61 - 1.24 mg/dL   Calcium 6.8 (L) 8.9 - 10.3 mg/dL   Total Protein 6.8 6.5 - 8.1 g/dL   Albumin 3.1 (L) 3.5 - 5.0 g/dL   AST 15 15 - 41 U/L   ALT 11 0 - 44 U/L   Alkaline Phosphatase 58 38 - 126 U/L   Total Bilirubin 0.4 0.3 - 1.2 mg/dL   GFR, Estimated 10 (L) >60 mL/min    Comment: (NOTE) Calculated using the CKD-EPI Creatinine Equation (2021)    Anion gap 12 5 - 15    Comment: Performed at [facility], [address] [place], [place] [address]  Magnesium     Status: Abnormal   Collection Time: 01/01/23  4:25 AM  Result Value Ref Range   Magnesium 1.5 (L) 1.7 - 2.4 mg/dL    Comment: Performed at [facility], [address] [place], [place] [address]  CBC with Differential/Platelet     Status: Abnormal  Collection Time: 01/01/23  4:25 AM  Result Value Ref Range   WBC 7.8 4.0 - 10.5 K/uL   RBC 3.68 (L) 4.22 - 5.81 MIL/uL   Hemoglobin 9.8 (L) 13.0 - 17.0 g/dL   HCT 29.9 (L) 39.0 - 52.0 %   MCV 81.3 80.0 - 100.0 fL   MCH 26.6 26.0 - 34.0 pg   MCHC 32.8 30.0 - 36.0 g/dL   RDW 15.9 (H) 11.5 - 15.5 %   Platelets 244 150 - 400 K/uL   nRBC 0.0 0.0 - 0.2 %   Neutrophils Relative % 67 %   Neutro Abs 5.3 1.7 - 7.7 K/uL   Lymphocytes Relative 19 %   Lymphs Abs 1.5 0.7 - 4.0 K/uL   Monocytes Relative 10 %   Monocytes Absolute 0.8 0.1 - 1.0 K/uL   Eosinophils Relative 2 %   Eosinophils Absolute 0.2 0.0 - 0.5 K/uL   Basophils Relative 1 %   Basophils Absolute 0.1 0.0 - 0.1 K/uL   Immature Granulocytes 1 %   Abs Immature Granulocytes 0.04 0.00 - 0.07 K/uL    Comment: Performed at [facility], [address], [place], [place] [address]  TSH     Status: None   Collection Time: 01/01/23  4:25 AM  Result Value Ref Range   TSH 3.724 0.350 - 4.500 uIU/mL    Comment: Performed by a 3rd Generation assay with a functional sensitivity of <=0.01 uIU/mL. Performed at [facility], [address] [place], [place] [address]   Glucose, capillary     Status: Abnormal   Collection Time: 01/01/23  7:15 AM  Result Value Ref Range   Glucose-Capillary 272 (H) 70 - 99 mg/dL    Comment: Glucose reference range applies only to samples taken after fasting for at least 8 hours.    CT Head Wo Contrast  Result Date: 12/31/2022 CLINICAL DATA:  Delirium EXAM: CT HEAD WITHOUT CONTRAST TECHNIQUE: Contiguous axial images were obtained from the base of the skull through the vertex without intravenous contrast. RADIATION DOSE REDUCTION: This exam was performed according to the departmental dose-optimization program which includes automated exposure control, adjustment of the mA and/or kV according to patient size and/or use of iterative reconstruction technique. COMPARISON:  April 06, 2022 FINDINGS: Brain: No evidence of acute infarction, hemorrhage, hydrocephalus, extra-axial collection or mass lesion/mass effect. Periventricular white matter hypodensities consistent with sequela of chronic microvascular ischemic disease. Remote LEFT pontine infarction. Vascular: Vascular calcifications. Skull: No acute fracture. 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 Sinuses/Orbits: Chronic LEFT maxillary sinusitis. Chronic LEFT mastoid effusion. Other: None IMPRESSION: 1. No acute intracranial abnormality. Electronically Signed   By: Valentino Saxon M.D.   On: 12/31/2022 20:04    ROS: really negative Blood pressure (!) 181/77, pulse 65, temperature 98.7 F (37.1 C), temperature source Oral, resp. rate 20, height '5\' 11"'$  (1.803 m), weight 80.7 kg, SpO2 99 %. General appearance: alert, cooperative, and no distress Resp: clear to auscultation bilaterally Cardio: regular rate and rhythm, S1, S2 normal, no murmur, click, rub or gallop GI: soft, non-tender; bowel sounds normal; no masses,  no organomegaly Extremities: extremities normal, atraumatic, no cyanosis or edema and varicose veins noted Right  upper AVF-  good thrill and bruit  Assessment/Plan: 77 year old BM with ESRD -  presenting with sz like activity-  1 sz like activity-  per primary and neuro-  for work up including brain imaging 2 ESRD: normally TTS-  will plan for routine HD tomorrow -  if discharge is truly planned for tomorrow pt would prefer as would the HD staff for him to be discharged in the AM and get his HD as OP tomorrow afternoon-  I have discussed this with the HD RN-  she is going to call over there today to see if we can arrange.  If not will need HD here 3 Hypertension/vol: I was not able to examine-  is 3 kg above EDW by weight today and is hyponatremic- will need moderate volume removal tomorrow 4. Anemia of ESRD: is slightly below the threshold of treatment today-  will give ESA and iron tomorrow 5. Metabolic Bone Disease: he is not on vitamin D-  also no binder on meds list   Louis Meckel 01/01/2023, 8:37 AM

## 2023-01-01 NOTE — Assessment & Plan Note (Signed)
-   Continue aspirin and statin - Continue blood pressure control -No new focal deficits or abnormalities appreciated on brain images.

## 2023-01-01 NOTE — Progress Notes (Signed)
EEG complete - results pending 

## 2023-01-01 NOTE — Progress Notes (Signed)
Pt A&O, no s/s seizure activity today. Pt denies any c/o pain, SOB or nausea. Tolerated po food/fluid without difficulty. Ambulates well, pt is HFR due to ? Seizure activity prior to admission, pt calls for assist prior to getting OOB for safety.

## 2023-01-01 NOTE — Assessment & Plan Note (Addendum)
-   As high as 221/96 - With acute metabolic encephalopathy - Troponin normal x 2 - CT head is negative for acute findings - Creatinine is elevated in this ESRD patient - Check TSH - Coreg, Cardizem, hydralazine given in the ED - Requesting repeat blood pressure at this time, if remaining high will treat with IV medication - Continue to monitor

## 2023-01-01 NOTE — TOC Progression Note (Signed)
  Transition of Care [facility]) Screening Note   Patient Details  Name: [place] Date of Birth: 17-Nov-1945   Transition of Care [facility]) CM/SW Contact:    Shade Flood, LCSW Phone Number: 01/01/2023, 11:41 AM    Transition of Care Department [facility]) has reviewed patient and no TOC needs have been identified at this time. We will continue to monitor patient advancement through interdisciplinary progression rounds. If new patient transition needs arise, please place a TOC consult.

## 2023-01-01 NOTE — Assessment & Plan Note (Signed)
-   Glucose 357 in the ER - Sliding scale coverage ordered - Patient is currently refusing CBGs because he does not want to be stuck - Counseled on importance of euglycemia, but we will see the trend of the blood sugar with a.m. labs - Given the hyperglycemia his full dose of his basal insulin was ordered at 6 units

## 2023-01-01 NOTE — Assessment & Plan Note (Signed)
-   Tuesday Thursday Saturday dialysis schedule - Nephrology consult entered - Defer electrolyte management to nephrology - Continue to monitor

## 2023-01-01 NOTE — Inpatient Diabetes Management (Signed)
Inpatient Diabetes Program Recommendations  AACE/ADA: New Consensus Statement on Inpatient Glycemic Control (2015)  Target Ranges:  Prepandial:   less than 140 mg/dL      Peak postprandial:   less than 180 mg/dL (1-2 hours)      Critically ill patients:  140 - 180 mg/dL   Lab Results  Component Value Date   GLUCAP 350 (H) 01/01/2023   HGBA1C 7.0 (H) 04/06/2022    Latest Reference Range & Units 12/31/22 19:27 01/01/23 07:15 01/01/23 11:32  Glucose-Capillary 70 - 99 mg/dL 366 (H) 272 (H) 350 (H)  (H): Data is abnormally high  Diabetes history: DM2 Outpatient Diabetes medications: Lantus 6 units bid (only takes if CBG >200) Current orders for Inpatient glycemic control: Semglee 6 units qd, Novolog 0-15 units tid + 0-5 units hs correction  Inpatient Diabetes Program Recommendations:   Spoke with patient via phone (DM coordinator @ [facility]) Patient has only been taking the Lantus if CBGs > 200 @ home since he has been on dialysis. States his doctor is aware of how he is taking his insulin doses. Reviewed with patient normal ranges of CBGs. Patient refused insulin last hs so has not received Semglee yet. Discussed need of insulin with patient and agrees to take insulin.  Please consider: -Decrease Novolog correction to 0-9 units tid, 0-5 units hs  Will follow while inpatient.  Thank you, Nani Gasser. Nayah Lukens, RN, MSN, CDE  Diabetes Coordinator Inpatient Glycemic Control Team Team Pager [phone] (8am-5pm) 01/01/2023 1:25 PM

## 2023-01-01 NOTE — Progress Notes (Signed)
Pt arrived to rm 325 from ED via wheelchair. Pt ambulated without difficulty from wc to bed and to bathroom independently. A&O x 4.Seizure precautions in place .Pt educated on reasons for seizure precautions as well as upcoming tests.

## 2023-01-01 NOTE — H&P (Signed)
History and Physical    Patient: Omar Parker DOB: 1946-04-09 DOA: 12/31/2022 DOS: the patient was seen and examined on 01/01/2023 PCP: Redmond School, MD  Patient coming from: Home  Chief Complaint:  Chief Complaint  Patient presents with   Altered Mental Status   HPI: Omar Parker is a 77 y.o. 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  It was described to the ED provider as the patient had his eyes rolled back in his head to the right, he had lipsmacking, shaking, and the episode was self aborting.  The patient reports he remembers the whole thing.  He reports he was in the middle of conversation when he lost his speech.  He knew what he wanted to say but could not speak.  He was not drooling.  He had no difficulty swallowing.  He reports that he had tremors around his mouth but not in his extremities.  He said the episode lasted a few minutes.  During that time he had no headache, chest pain, shortness of breath.  He had no numbness or asymmetrical weakness.  He just could not get his words out.  On review of systems patient reports he was in his normal state of health. Neuro was consulted from the ED and recommended MRI and EEG to evaluate for stroke versus seizure. Patient does not smoke, does not drink, does not use illicit drugs.  He was vaccinated for the flu but not COVID.  Patient is full code. Review of Systems: As mentioned in the history of present illness. All other systems reviewed and are negative. Past Medical History:  Diagnosis Date   Anemia    IDA   Diabetes mellitus without complication (HCC)    GERD (gastroesophageal reflux disease)    High cholesterol    Hypertension    Stage 4 chronic kidney disease (HCC)    Past Surgical History:  Procedure Laterality Date   AV  FISTULA PLACEMENT Left 06/28/2020   Procedure: LEFT UPPER ARM ARTERIOVENOUS (AV) GORE-TEX GRAFT;  Surgeon: Elam Dutch, MD;  Location: [place];  Service: Vascular;  Laterality: Left;   COLONOSCOPY N/A 05/16/2017   Procedure: COLONOSCOPY;  Surgeon: Rogene Houston, MD;  [facility];  Service: Endoscopy;  Laterality: N/A;  7:30   COLONOSCOPY N/A 04/10/2020   Procedure: COLONOSCOPY;  Surgeon: Daneil Dolin, MD;  [facility];  Service: Endoscopy;  Laterality: N/A;   ESOPHAGOGASTRODUODENOSCOPY N/A 04/10/2020   Procedure: ESOPHAGOGASTRODUODENOSCOPY (EGD);  Surgeon: Daneil Dolin, MD;  [facility];  Service: Endoscopy;  Laterality: N/A;   HERNIA REPAIR     INSERTION OF DIALYSIS CATHETER  06/28/2020   Procedure: INSERTION OF DIALYSIS CATHETER;  Surgeon: Elam Dutch, MD;  Location[facility] OR;  Service: Vascular;;   IR REMOVAL TUN CV CATH W/O [facility]  09/21/2020   POLYPECTOMY  04/10/2020   Procedure: POLYPECTOMY;  Surgeon: Daneil Dolin, MD;  [facility];  Service: Endoscopy;;  cecal;   Social History:  reports that he has never smoked. He has never used smokeless tobacco. He reports that he does not currently use alcohol. He reports that he does not currently use drugs after having used the following drugs: Marijuana.  Allergies  Allergen Reactions   Ace Inhibitors Swelling    Family History  Problem Relation Age  of Onset   Chronic Renal Failure Neg Hx     Prior to Admission medications   Medication Sig Start Date End Date Taking? Authorizing Provider  acetaminophen (TYLENOL) 325 MG tablet Take 650 mg by mouth every 6 (six) hours as needed for moderate pain.     [provider]  aspirin EC 81 MG tablet Take 1 tablet (81 mg total) by mouth daily. Swallow whole. 04/08/22   Johnson, Clanford L, MD  atorvastatin (LIPITOR) 80 MG tablet Take 1 tablet (80 mg total) by mouth daily at 6 PM. 04/07/22   Johnson, Clanford L, MD  carvedilol (COREG)  3.125 MG tablet Take 2 tablets (6.25 mg total) by mouth 2 (two) times daily with a meal. 04/08/22 05/08/22  Johnson, Clanford L, MD  diltiazem (CARDIZEM CD) 240 MG 24 hr capsule Take 1 capsule (240 mg total) by mouth 2 (two) times daily. 04/08/22   Johnson, Clanford L, MD  furosemide (LASIX) 20 MG tablet Take 20 mg by mouth daily.    [provider]  hydrALAZINE (APRESOLINE) 50 MG tablet Take 3 tablets (150 mg total) by mouth 2 (two) times daily. 04/07/22 05/07/22  Johnson, Clanford L, MD  insulin glargine (LANTUS) 100 UNIT/ML injection Inject 0.06 mLs (6 Units total) into the skin 2 (two) times daily. 04/07/22   Johnson, Clanford L, MD  pantoprazole (PROTONIX) 40 MG tablet Take 40 mg by mouth daily.     [provider]    Physical Exam: Vitals:   12/31/22 2300 12/31/22 2326 01/01/23 0007 01/01/23 0200  BP: (!) 177/76 (!) 209/85 (!) 196/80   Pulse:  71 67   Resp: (!) 23 20    Temp:  98.1 F (36.7 C)    TempSrc:  Oral    SpO2:  100%    Weight:    80.7 kg  Height:    '5\' 11"'$  (1.803 m)   1.  General: Patient lying supine in bed,  no acute distress   2. Psychiatric: Alert and oriented x 3, mood and behavior normal for situation, pleasant and cooperative with exam   3. Neurologic: Speech and language are normal, face is symmetric, moves all 4 extremities voluntarily, at baseline without acute deficits on limited exam   4. HEENMT:  Head is atraumatic, normocephalic, pupils reactive to light, neck is supple, trachea is midline, mucous membranes are moist   5. Respiratory : Lungs are clear to auscultation bilaterally without wheezing, rhonchi, rales, no cyanosis, no increase in work of breathing or accessory muscle use   6. Cardiovascular : Heart rate normal, rhythm is regular, no murmurs, rubs or gallops, no peripheral edema, peripheral pulses palpated   7. Gastrointestinal:  Abdomen is soft, nondistended, nontender to palpation bowel sounds active, no masses or organomegaly  palpated   8. Skin:  Skin is warm, dry and intact without rashes, acute lesions, or ulcers on limited exam   9.Musculoskeletal:  No acute deformities or trauma, no asymmetry in tone, no peripheral edema, peripheral pulses palpated, no tenderness to palpation in the extremities  Data Reviewed: In the ED Temp 97.8, heart rate 71-77, respiratory rate 11-20, blood pressure 182/56-221/96, satting 99-100% No leukocytosis with a white blood cell count of 7.6, hemoglobin stable at 10.2 Chemistry reveals a hyponatremia at 3.2, elevated BUN at 32, elevated creatinine at 5.50 in this ESRD patient Patient was hyperglycemic at 357 Troponin normal x 2 UA is not indicative of UTI UDS is negative CT head is negative for acute intracranial finding  EKG shows a heart rate of 79, sinus rhythm, QTc 496 Patient was given Coreg, Cardizem, hydralazine p.o. in the ED Admission was requested for altered mental status that could be stroke versus seizure  Assessment and Plan: * Acute metabolic encephalopathy - As described in HPI - CVA versus seizure versus hypertensive emergency - Tele neuro recommended MRI and EEG, ordered -Inpatient consult with neurology - CT head was negative for acute findings - UDS was negative - UA was not indicative of UTI - Patient has since returned to baseline - Continue to monitor  History of CVA (cerebrovascular accident) - Continue aspirin and statin - Continue blood pressure control  GERD (gastroesophageal reflux disease) - Continue PPI  ESRD on dialysis [facility]) - Tuesday Thursday Saturday dialysis schedule - Nephrology consult entered - Defer electrolyte management to nephrology - Continue to monitor  Type 2 diabetes mellitus with renal complication (HCC) - Glucose 357 in the ER - Sliding scale coverage ordered - Patient is currently refusing CBGs because he does not want to be stuck - Counseled on importance of euglycemia, but we will see the trend of the blood  sugar with a.m. labs - Given the hyperglycemia his full dose of his basal insulin was ordered at 6 units  Hypertensive crisis - As high as 221/96 - With acute metabolic encephalopathy - Troponin normal x 2 - CT head is negative for acute findings - Creatinine is elevated in this ESRD patient - Coreg, Cardizem, hydralazine given in the ED - Requesting repeat blood pressure at this time, if remaining high will treat with IV medication - Continue to monitor  High cholesterol - Continue statin      Advance Care Planning:   Code Status: Full Code   Consults: Neurology  Family Communication: No family at bedside  Severity of Illness: The appropriate patient status for this patient is OBSERVATION. Observation status is judged to be reasonable and necessary in order to provide the required intensity of service to ensure the patient's safety. The patient's presenting symptoms, physical exam findings, and initial radiographic and laboratory data in the context of their medical condition is felt to place them at decreased risk for further clinical deterioration. Furthermore, it is anticipated that the patient will be medically stable for discharge from the hospital within 2 midnights of admission.   Author: Rolla Plate, DO 01/01/2023 2:56 AM  For on call review [email or web address].

## 2023-01-01 NOTE — Progress Notes (Signed)
Patient seen and examined. Admitted after midnight with stroke like symptoms. CT head w/o contras demonstrating no acute intracranial abnormalities. Patient is hemodynamically stable and overall symptoms subsided at the moment. Please refer to H&P written by Dr. Clearence Ped for further info/details on admission.  Plan: -complete stroke work up -follow rec's by neurology service -HD plans as per nephrology service -continue supportive care -follow clinical response. -continue ASA for secondary prevention.   Barton Dubois MD [phone]

## 2023-01-01 NOTE — Consult Note (Addendum)
I connected with  Omar Parker on 01/01/23 by a video enabled telemedicine application and verified that I am speaking with the correct person using two identifiers.   I discussed the limitations of evaluation and management by telemedicine. The patient expressed understanding and agreed to proceed.   Location of patient: [facility] Location of physician: [facility]  Neurology Consultation Reason for Consult: ams Referring Physician:  Dr. Carlynn Purl   CC: ams  History is obtained from: Patient, wife on phone, chart review  HPI: Omar Parker is a 77 y.o. male with medical history of hypertension, diabetes, hyperlipidemia, end-stage renal disease on HD TTS who was brought in after a seizure-like episode.  Patient states yesterday evening between 5 and 6 PM he was sitting talking to his family when suddenly he could not get his words out.  Per wife, he was mumbling, saying things that do not make sense.  They lowered him on the floor and he had jerking of all extremities.  No bowel/bladder incontinence, no tongue bite.  Episode lasted about 2 to 3 minutes.  They called EMS and patient was brought to the hospital.  He was already back to baseline before arrival to hospital.  Patient and family deny any previous episodes like this.  Denies any recent changes in medications.  Of note, his blood pressure on arrival was 221/56, CBG 357.  Patient states his blood pressure is usually higher and then drops after dialysis.  Last dialysis was on Saturday.  ROS: All other systems reviewed and negative except as noted in the HPI.  Past Medical History:  Diagnosis Date   Anemia    IDA   Diabetes mellitus without complication (HCC)    GERD (gastroesophageal reflux disease)    High cholesterol    Hypertension    Stage 4 chronic kidney disease (HCC)     Family History  Problem Relation Age of Onset   Chronic Renal Failure Neg Hx     Social History:  reports that he has never  smoked. He has never used smokeless tobacco. He reports that he does not currently use alcohol. He reports that he does not currently use drugs after having used the following drugs: Marijuana.   Medications Prior to Admission  Medication Sig Dispense Refill Last Dose   acetaminophen (TYLENOL) 325 MG tablet Take 650 mg by mouth every 6 (six) hours as needed for moderate pain.    12/31/2022   aspirin EC 81 MG tablet Take 1 tablet (81 mg total) by mouth daily. Swallow whole. 30 tablet 12 12/31/2022   carvedilol (COREG) 3.125 MG tablet Take 2 tablets (6.25 mg total) by mouth 2 (two) times daily with a meal. 120 tablet 0 12/31/2022 at am   diltiazem (CARDIZEM CD) 240 MG 24 hr capsule Take 1 capsule (240 mg total) by mouth 2 (two) times daily.   12/31/2022   furosemide (LASIX) 20 MG tablet Take 20 mg by mouth daily.   12/31/2022   hydrALAZINE (APRESOLINE) 50 MG tablet Take 3 tablets (150 mg total) by mouth 2 (two) times daily. 180 tablet 0 12/31/2022   insulin glargine (LANTUS) 100 UNIT/ML injection Inject 0.06 mLs (6 Units total) into the skin 2 (two) times daily.   12/31/2022   pantoprazole (PROTONIX) 40 MG tablet Take 40 mg by mouth daily.    12/31/2022   potassium chloride SA (KLOR-CON M) 20 MEQ tablet Take 10 mEq by mouth daily.   12/31/2022   pravastatin (PRAVACHOL)  80 MG tablet Take 40 mg by mouth daily.   12/31/2022      Exam: Current vital signs: BP (!) 175/67 (BP Location: Right Arm)   Pulse 65   Temp 98.4 F (36.9 C)   Resp 18   Ht '5\' 11"'$  (1.803 m)   Wt 80.7 kg   SpO2 100%   BMI 24.81 kg/m  Vital signs in last 24 hours: Temp:  [97.8 F (36.6 C)-98.7 F (37.1 C)] 98.4 F (36.9 C) (03/04 1223) Pulse Rate:  [62-77] 65 (03/04 1223) Resp:  [10-23] 18 (03/04 1223) BP: (172-221)/(56-96) 175/67 (03/04 1223) SpO2:  [99 %-100 %] 100 % (03/04 1223) Weight:  [77.6 kg-80.7 kg] 80.7 kg (03/04 0200)   Physical Exam  Constitutional: Appears well-developed and well-nourished.  Psych: Affect appropriate  to situation Eyes: No scleral injection Neuro: AOx3, no aphasia, cranial nerves II to XII grossly intact, antigravity strength in all extremities without drift, can intact bilaterally, sensation intact to light touch  I have reviewed labs in epic and the results pertinent to this consultation are: CBC:  Recent Labs  Lab 12/31/22 1927 01/01/23 0425  WBC 7.6 7.8  NEUTROABS 4.6 5.3  HGB 10.2* 9.8*  HCT 31.3* 29.9*  MCV 81.7 81.3  PLT 261 [id number]    Basic Metabolic Panel:  Lab Results  Component Value Date   NA 133 (L) 01/01/2023   K 3.6 01/01/2023   CO2 23 01/01/2023   GLUCOSE 307 (H) 01/01/2023   BUN 32 (H) 01/01/2023   CREATININE 5.54 (H) 01/01/2023   CALCIUM 6.8 (L) 01/01/2023   GFRNONAA 10 (L) 01/01/2023   GFRAA 11 (L) 04/10/2020   Lipid Panel:  Lab Results  Component Value Date   LDLCALC 137 (H) 04/07/2022   HgbA1c:  Lab Results  Component Value Date   HGBA1C 7.0 (H) 04/06/2022   Urine Drug Screen:     Component Value Date/Time   LABOPIA NONE DETECTED 12/31/2022 2001   COCAINSCRNUR NONE DETECTED 12/31/2022 2001   LABBENZ NONE DETECTED 12/31/2022 2001   AMPHETMU NONE DETECTED 12/31/2022 2001   THCU NONE DETECTED 12/31/2022 2001   LABBARB NONE DETECTED 12/31/2022 2001    Alcohol Level No results found for: "ETH"   I have reviewed the images obtained:  CT Head without contrast 12/31/2022: No acute intracranial abnormality.   MRI Brain 3//2024:  No acute intracranial abnormality. 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 Mild chronic small vessel ischemic disease.  ASSESSMENT/PLAN: 77 year old male who presented with seizure-like episode in the setting of blood pressure 221/56, CBG 357.   Seizure-like episode Hypertensive urgency -Patient had seizure-like episode in the setting of high blood pressure.  However, the episode did have focal onset which increases risk for recurrence  Recommendations: -Discussed  the risk and benefit of starting medications with patient and wife.  As this was patient's first episode in setting of high blood pressure, will hold off on starting antiseizure medications for now.  However, if patient has any recurrence of seizure-like episodes, will need to be on antiseizure medications -Because not starting antiseizure medication, would recommend discharging patient with clonazepam 1 mg oral dissolving tablet for clinical seizure-like activity lasting more than 2 minutes at home. -Will also obtain MRA head and neck to look for any intracranial stenosis that could potentially suggest a TIA although the duration of just few minutes makes it unlikely -Discussed seizure precautions including do not drive -Recommend follow-up with  Dr. April Manson at [facility].  Can consider repeat EEG at that time to see if left temporal slowing is persistent -Discussed plan with patient, wife and Dr. Dyann Kief  Seizure precautions: Per [facility] statutes, patients with seizures are not allowed to drive until they have been seizure-free for six months and cleared by a physician    Use caution when using heavy equipment or power tools. Avoid working on ladders or at heights. Take showers instead of baths. Ensure the water temperature is not too high on the home water heater. Do not go swimming alone. Do not lock yourself in a room alone (i.e. bathroom). When caring for infants or small children, sit down when holding, feeding, or changing them to minimize risk of injury to the child in the event you have a seizure. Maintain good sleep hygiene. Avoid alcohol.    If patient has another seizure, call 911 and bring them back to the ED if: A.  The seizure lasts longer than 5 minutes.      B.  The patient doesn't wake shortly after the seizure or has new problems such as difficulty seeing, speaking or moving following the seizure C.  The patient was injured during the seizure D.  The  patient has a temperature over 102 F (39C) E.  The patient vomited during the seizure and now is having trouble breathing    During the Seizure   - First, ensure adequate ventilation and place patients on the floor on their left side  Loosen clothing around the neck and ensure the airway is patent. If the patient is clenching the teeth, do not force the mouth open with any object as this can cause severe damage - Remove all items from the surrounding that can be hazardous. The patient may be oblivious to what's happening and may not even know what he or she is doing. If the patient is confused and wandering, either gently guide him/her away and block access to outside areas - Reassure the individual and be comforting - Call 911. In most cases, the seizure ends before EMS arrives. However, there are cases when seizures may last over 3 to 5 minutes. Or the individual may have developed breathing difficulties or severe injuries. If a pregnant patient or a person with diabetes develops a seizure, it is prudent to call an ambulance. - Finally, if the patient does not regain full consciousness, then call EMS. Most patients will remain confused for about 45 to 90 minutes after a seizure, so you must use judgment in calling for help.    After the Seizure (Postictal Stage)   After a seizure, most patients experience confusion, fatigue, muscle pain and/or a headache. Thus, one should permit the individual to sleep. For the next few days, reassurance is essential. Being calm and helping reorient the person is also of importance.   Most seizures are painless and end spontaneously. Seizures are not harmful to others but can lead to complications such as stress on the lungs, brain and the heart. Individuals with prior lung problem may develop labored breathing and respiratory distress.    Thank you for allowing [place] to participate in the care of this patient. If you have any further questions, please contact  me  or neurohospitalist.   Zeb Com[facility] neurohospitalist

## 2023-01-01 NOTE — Assessment & Plan Note (Addendum)
-   As described in HPI - CVA versus seizure versus hypertensive emergency. -No acute stroke appreciated on imaging studies; EEG without frank epileptic waveforms or abnormalities to diagnose seizure activity. -Patient's condition improved/stabilized -Adjustment to blood pressure medications has been provided -Patient will follow-up with neurology as an outpatient -Continue risk factor modifications - UDS was negative - UA was not indicative of UTI - Patient's mentation back to baseline at time of discharge.

## 2023-01-01 NOTE — Plan of Care (Signed)

## 2023-01-02 DIAGNOSIS — D631 Anemia in chronic kidney disease: Secondary | ICD-10-CM | POA: Diagnosis not present

## 2023-01-02 DIAGNOSIS — E1122 Type 2 diabetes mellitus with diabetic chronic kidney disease: Secondary | ICD-10-CM | POA: Diagnosis not present

## 2023-01-02 DIAGNOSIS — I169 Hypertensive crisis, unspecified: Secondary | ICD-10-CM | POA: Diagnosis not present

## 2023-01-02 DIAGNOSIS — K219 Gastro-esophageal reflux disease without esophagitis: Secondary | ICD-10-CM | POA: Diagnosis not present

## 2023-01-02 DIAGNOSIS — E7800 Pure hypercholesterolemia, unspecified: Secondary | ICD-10-CM | POA: Diagnosis not present

## 2023-01-02 DIAGNOSIS — N186 End stage renal disease: Secondary | ICD-10-CM | POA: Diagnosis not present

## 2023-01-02 DIAGNOSIS — Z992 Dependence on renal dialysis: Secondary | ICD-10-CM | POA: Diagnosis not present

## 2023-01-02 DIAGNOSIS — G9341 Metabolic encephalopathy: Secondary | ICD-10-CM | POA: Diagnosis not present

## 2023-01-02 DIAGNOSIS — R569 Unspecified convulsions: Secondary | ICD-10-CM | POA: Diagnosis not present

## 2023-01-02 DIAGNOSIS — G4089 Other seizures: Secondary | ICD-10-CM | POA: Diagnosis not present

## 2023-01-02 DIAGNOSIS — N250 Renal osteodystrophy: Secondary | ICD-10-CM | POA: Diagnosis not present

## 2023-01-02 DIAGNOSIS — Z8673 Personal history of transient ischemic attack (TIA), and cerebral infarction without residual deficits: Secondary | ICD-10-CM | POA: Diagnosis not present

## 2023-01-02 DIAGNOSIS — I120 Hypertensive chronic kidney disease with stage 5 chronic kidney disease or end stage renal disease: Secondary | ICD-10-CM | POA: Diagnosis not present

## 2023-01-02 LAB — HEMOGLOBIN A1C
Hgb A1c MFr Bld: 10.2 % — ABNORMAL HIGH (ref 4.8–5.6)
Mean Plasma Glucose: 246 mg/dL

## 2023-01-02 LAB — GLUCOSE, CAPILLARY: Glucose-Capillary: 217 mg/dL — ABNORMAL HIGH (ref 70–99)

## 2023-01-02 MED ORDER — INSULIN GLARGINE 100 UNIT/ML ~~LOC~~ SOLN: 6.0000 [IU] | Freq: Every day | SUBCUTANEOUS | Status: DC

## 2023-01-02 MED ORDER — HYDRALAZINE HCL 100 MG PO TABS: 100.0000 mg | ORAL_TABLET | Freq: Three times a day (TID) | ORAL | 2 refills | Status: DC

## 2023-01-02 MED ORDER — ATORVASTATIN CALCIUM 80 MG PO TABS: 80.0000 mg | ORAL_TABLET | Freq: Every day | ORAL | 1 refills | Status: DC

## 2023-01-02 MED ORDER — CARVEDILOL 12.5 MG PO TABS: 12.5000 mg | ORAL_TABLET | Freq: Two times a day (BID) | ORAL | 3 refills | Status: DC

## 2023-01-02 MED ORDER — CLONAZEPAM 1 MG PO TABS: 1.0000 mg | ORAL_TABLET | Freq: Two times a day (BID) | ORAL | 0 refills | Status: DC | PRN

## 2023-01-02 NOTE — Plan of Care (Signed)
  Problem: Education: Goal: Knowledge of General Education information will improve Description Including pain rating scale, medication(s)/side effects and non-pharmacologic comfort measures Outcome: Progressing   Problem: Health Behavior/Discharge Planning: Goal: Ability to manage health-related needs will improve Outcome: Progressing   

## 2023-01-02 NOTE — Discharge Summary (Signed)
Physician Discharge Summary   Patient: Omar Parker MRN: [id number] DOB: 06-03-1946  Admit date:     12/31/2022  Discharge date: 01/02/23  Discharge Physician: Barton Dubois   PCP: Redmond School, MD   Recommendations at discharge:  Outpatient follow-up with neurology service as instructed Repeat CBC to follow hemoglobin trend/stability. Reassess blood pressure and further adjust antihypertensive regimen as needed.  Discharge Diagnoses: Principal Problem:   Acute metabolic encephalopathy Active Problems:   Seizure-like activity (HCC)   High cholesterol   Hypertensive crisis   Type 2 diabetes mellitus with renal complication (HCC)   ESRD on dialysis ([place])   GERD (gastroesophageal reflux disease)   History of CVA (cerebrovascular accident)  Hospital Course: Omar Parker is a 77 y.o. 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  It was described to the ED provider as the patient had his eyes rolled back in his head to the right, he had lipsmacking, shaking, and the episode was self aborting.  The patient reports he remembers the whole thing.  He reports he was in the middle of conversation when he lost his speech.  He knew what he wanted to say but could not speak.  He was not drooling.  He had no difficulty swallowing.  He reports that he had tremors around his mouth but not in his extremities.  He said the episode lasted a few minutes.  During that time he had no headache, chest pain, shortness of breath.  He had no numbness or asymmetrical weakness.  He just could not get his words out.  On review of systems patient reports he was in his normal state of health. Neuro was consulted from the ED and recommended MRI and EEG to evaluate for stroke versus seizure. Patient does not smoke, does not drink, does not  use illicit drugs.  He was vaccinated for the flu but not COVID.  Patient is full code.  Assessment and Plan: * Acute metabolic encephalopathy - As described in HPI - CVA versus seizure versus hypertensive emergency. -No acute stroke appreciated on imaging studies; EEG without frank epileptic waveforms or abnormalities to diagnose seizure activity. -Patient's condition improved/stabilized -Adjustment to blood pressure medications has been provided -Patient will follow-up with neurology as an outpatient -Continue risk factor modifications - UDS was negative - UA was not indicative of UTI - Patient's mentation back to baseline at time of discharge.  Seizure-like activity ([place]) -No frank normalities appreciated on EEG -Neurology service planning to follow-up as an outpatient -As needed clonazepam for seizure-like activity lasting more than 2 minutes has been recommended. -Precautions/restrictions discussed with patient.  History of CVA (cerebrovascular accident) - Continue aspirin and statin - Continue blood pressure control -No new focal deficits or abnormalities appreciated on brain images.  GERD (gastroesophageal reflux disease) - Continue PPI  ESRD on dialysis [facility]) - Appreciate assistance and recommendation by nephrology service -Patient discharged home with instruction to follow-up with his outpatient dialysis unit.  Type 2 diabetes mellitus with renal complication (HCC) - Glucose 357 in the ER - Resume home hypoglycemic regimen -Outpatient follow-up of patient's CBGs/A1c with further adjustment to medications recommended. -Modified carbohydrate diet discussed with patient.  Hypertensive crisis - As high as 221/96 - Troponin negative x 2. - CT head is negative for acute findings - TSH within normal limits. -Adjusted antihypertensive regimen has been provided at  time of discharge -Heart healthy/low-sodium diet discussed with patient. -Continue to follow vital  signs.  High cholesterol -Continue statin   Consultants: Nephrology and neurology Procedures performed: See below chest x-ray reports.  EEG: Not demonstrating epileptic waves or seizure activity. Disposition: Home Diet recommendation: Heart healthy modified carbohydrate diet discussed with patient.  DISCHARGE MEDICATION: Allergies as of 01/02/2023       Reactions   Ace Inhibitors Swelling        Medication List     STOP taking these medications    furosemide 20 MG tablet Commonly known as: LASIX   potassium chloride SA 20 MEQ tablet Commonly known as: KLOR-CON M       TAKE these medications    acetaminophen 325 MG tablet Commonly known as: TYLENOL Take 650 mg by mouth every 6 (six) hours as needed for moderate pain.   aspirin EC 81 MG tablet Take 1 tablet (81 mg total) by mouth daily. Swallow whole.   atorvastatin 80 MG tablet Commonly known as: LIPITOR Take 1 tablet (80 mg total) by mouth daily at 6 PM.   carvedilol 12.5 MG tablet Commonly known as: COREG Take 1 tablet (12.5 mg total) by mouth 2 (two) times daily with a meal. What changed:  medication strength how much to take   clonazePAM 1 MG tablet Commonly known as: KlonoPIN Take 1 tablet (1 mg total) by mouth every 12 (twelve) hours as needed (seizure like activity lasting more than 2 minutes).   diltiazem 240 MG 24 hr capsule Commonly known as: CARDIZEM CD Take 1 capsule (240 mg total) by mouth 2 (two) times daily.   hydrALAZINE 100 MG tablet Commonly known as: APRESOLINE Take 1 tablet (100 mg total) by mouth 3 (three) times daily. What changed:  medication strength how much to take when to take this   insulin glargine 100 UNIT/ML injection Commonly known as: LANTUS Inject 0.06 mLs (6 Units total) into the skin at bedtime. What changed: when to take this   pantoprazole 40 MG tablet Commonly known as: PROTONIX Take 40 mg by mouth daily.   pravastatin 80 MG tablet Commonly known as:  PRAVACHOL Take 40 mg by mouth daily.        Follow-up Information     Redmond School, MD. Schedule an appointment as soon as possible for a visit in 10 day(s).   Specialty: Internal Medicine Contact information: [address] [place] [id number] [phone]                Discharge Exam: Danley Danker Weights   12/31/22 1918 01/01/23 0200  Weight: 77.6 kg 80.7 kg   General exam: Alert, awake, oriented x 3 Respiratory system: Clear to auscultation. Respiratory effort normal. Cardiovascular system:RRR. No murmurs, rubs, gallops. Gastrointestinal system: Abdomen is nondistended, soft and nontender. No organomegaly or masses felt. Normal bowel sounds heard. Central nervous system: Alert and oriented. No focal neurological deficits. Extremities: No C/C/E, +pedal pulses Skin: No rashes, lesions or ulcers Psychiatry: Judgement and insight appear normal. Mood & affect appropriate.    Condition at discharge: Stable and improved.  The results of significant diagnostics from this hospitalization (including imaging, microbiology, ancillary and laboratory) are listed below for reference.   Imaging Studies: MR ANGIO HEAD WO CONTRAST  Result Date: 01/01/2023 CLINICAL DATA:  Stroke, follow-up.  Aphasia. EXAM: MRA NECK WITHOUT CONTRAST MRA HEAD WITHOUT CONTRAST TECHNIQUE: Angiographic images of the Circle of Willis were acquired using MRA technique without intravenous contrast. COMPARISON:  MRA head and neck  04/06/2022 FINDINGS: MRA NECK FINDINGS Aortic arch: Limited assessment due to noncontrast technique and motion. Standard branching. Right carotid system: Patent with limited assessment of the proximal common carotid artery which is tortuous. No evidence of a significant stenosis or dissection in the remainder of the common carotid artery or included portion of the cervical ICA. Left carotid system: Patent without evidence of a significant stenosis or dissection in the common carotid  artery or included portion of the cervical ICA. Vertebral arteries: Patent left vertebral artery with antegrade flow. Limited assessment of the left vertebral origin without evidence of a significant stenosis or dissection more distally. Unchanged occlusion of the right vertebral artery at its origin without reconstitution in the neck. Other: None. MRA HEAD FINDINGS Anterior circulation: The internal carotid arteries are patent from skull base to carotid termini with similar appearance of mild to moderate right cavernous and bilateral proximal supraclinoid stenoses. ACAs and MCAs are patent without evidence of a proximal branch occlusion or significant M1 or left A1 stenosis. 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 An apparent severe stenosis of the right M2 inferior division is unchanged. No aneurysm is identified. Posterior circulation: The intracranial left vertebral artery is widely patent and supplies the basilar, and the intracranial right vertebral artery is chronically occluded. The basilar artery is patent with an unchanged mild stenosis in its midportion. There is a patent right posterior communicating artery. The PCAs are patent with left greater than right branch vessel attenuation. Anatomic variants: Aplastic right A1 segment. Other: None. IMPRESSION: 1. Chronic occlusion of the right vertebral artery. Limited assessment of the origin of the left vertebral artery which is otherwise widely patent. 2. Unchanged intracranial atherosclerosis including mild-to-moderate ICA stenoses, mild basilar artery stenosis, and a possible severe right M2 stenosis versus artifact. 3. Patent cervical carotid arteries without significant stenosis. Electronically Signed   By: Logan Bores M.D.   On: 01/01/2023 17:13   MR ANGIO NECK WO CONTRAST  Result Date: 01/01/2023 CLINICAL DATA:  Stroke, follow-up.  Aphasia. EXAM: MRA NECK WITHOUT CONTRAST MRA HEAD WITHOUT CONTRAST TECHNIQUE:  Angiographic images of the Circle of Willis were acquired using MRA technique without intravenous contrast. COMPARISON:  MRA head and neck 04/06/2022 FINDINGS: MRA NECK FINDINGS Aortic arch: Limited assessment due to noncontrast technique and motion. Standard branching. Right carotid system: Patent with limited assessment of the proximal common carotid artery which is tortuous. No evidence of a significant stenosis or dissection in the remainder of the common carotid artery or included portion of the cervical ICA. Left carotid system: Patent without evidence of a significant stenosis or dissection in the common carotid artery or included portion of the cervical ICA. Vertebral arteries: Patent left vertebral artery with antegrade flow. Limited assessment of the left vertebral origin without evidence of a significant stenosis or dissection more distally. Unchanged occlusion of the right vertebral artery at its origin without reconstitution in the neck. Other: None. MRA HEAD FINDINGS Anterior circulation: The internal carotid arteries are patent from skull base to carotid termini with similar appearance of mild to moderate right cavernous and bilateral proximal supraclinoid stenoses. ACAs and MCAs are patent without evidence of a proximal branch occlusion or significant M1 or left A1 stenosis. 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 An apparent severe stenosis of the right M2 inferior division is unchanged. No aneurysm is identified. Posterior circulation: The intracranial left vertebral artery is widely patent and supplies the basilar,  and the intracranial right vertebral artery is chronically occluded. The basilar artery is patent with an unchanged mild stenosis in its midportion. There is a patent right posterior communicating artery. The PCAs are patent with left greater than right branch vessel attenuation. Anatomic variants: Aplastic right A1 segment. Other: None.  IMPRESSION: 1. Chronic occlusion of the right vertebral artery. Limited assessment of the origin of the left vertebral artery which is otherwise widely patent. 2. Unchanged intracranial atherosclerosis including mild-to-moderate ICA stenoses, mild basilar artery stenosis, and a possible severe right M2 stenosis versus artifact. 3. Patent cervical carotid arteries without significant stenosis. Electronically Signed   By: Logan Bores M.D.   On: 01/01/2023 17:13   EEG adult  Result Date: 01/01/2023 Lora Havens, MD     01/01/2023  2:40 PM Patient Name: Omar Parker MRN: [id number] Epilepsy Attending: Lora Havens Referring Physician/Provider: Rolla Plate, DO Date: 01/01/2023 Duration: 27.46 mins Patient history: 77yo m with seizure like episode. EEG to evaluate for seizure Level of alertness: Awake, asleep AEDs during EEG study: None Technical aspects: This EEG study was done with scalp electrodes positioned according to the 10-20 International system of electrode placement. Electrical activity was reviewed with band pass filter of 1-'70Hz'$ , sensitivity of 7 uV/mm, display speed of 104m/sec with a '60Hz'$  notched filter applied as appropriate. EEG data were recorded continuously and digitally stored.  Video monitoring was available and reviewed as appropriate. Description: The posterior dominant rhythm consists of 8 Hz activity of moderate voltage (25-35 uV) seen predominantly in posterior head regions, symmetric and reactive to eye opening and eye closing. Sleep was characterized by vertex waves, sleep spindles (12 to 14 Hz), maximal frontocentral region. EEG showed intermittent 3-'5hz'$  Hz theta-delta slowing in left temporal region.. Hyperventilation did not show any EEG change.  Physiologic photic driving was seen during photic stimulation.   ABNORMALITY - Intermittent slow, left temporal region IMPRESSION: This study is suggestive of nonspecific cortical dysfunction arising from left temporal region. No  seizures or epileptiform discharges were seen throughout the recording. Please note that lack of epileptiform activity during interictal EEG does not exclude the diagnosis of epilepsy. PLora Havens  MR BRAIN WO CONTRAST  Result Date: 01/01/2023 CLINICAL DATA:  Mental status change, unknown cause. EXAM: MRI HEAD WITHOUT CONTRAST TECHNIQUE: Multiplanar, multiecho pulse sequences of the brain and surrounding structures were obtained without intravenous contrast. COMPARISON:  Head CT 12/31/2022 and MRI/MRA 04/06/2022 FINDINGS: It was decided not to administer IV contrast due to chronic renal failure after review of the noncontrast images. Brain: There is no evidence of an acute infarct, intracranial hemorrhage, mass, midline shift, or extra-axial fluid collection. T2 hyperintensities in the cerebral white matter and pons are similar to the prior MRI and are nonspecific but compatible with mild chronic small vessel ischemic disease. 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 Mild cerebral atrophy is within normal limits for age. Vascular: Chronically occluded right vertebral artery. Other major intracranial vascular flow voids are preserved. Skull and upper cervical spine: No suspicious marrow lesion. Sinuses/Orbits: Chronic left maxillary sinusitis. Large left mastoid effusion, mildly enlarged from the prior MRI. Left middle ear effusion. Other: None. IMPRESSION: 1. No acute intracranial abnormality. 2. Mild chronic small vessel ischemic disease. 3. Large left mastoid and middle ear effusions. Electronically Signed   By: ALogan BoresM.D.   On: 01/01/2023 09:10   CT Head Wo Contrast  Result Date:  12/31/2022 CLINICAL DATA:  Delirium EXAM: CT HEAD WITHOUT CONTRAST TECHNIQUE: Contiguous axial images were obtained from the base of the skull through the vertex without intravenous contrast. RADIATION DOSE REDUCTION: This exam was performed according to the  departmental dose-optimization program which includes automated exposure control, adjustment of the mA and/or kV according to patient size and/or use of iterative reconstruction technique. COMPARISON:  April 06, 2022 FINDINGS: Brain: No evidence of acute infarction, hemorrhage, hydrocephalus, extra-axial collection or mass lesion/mass effect. Periventricular white matter hypodensities consistent with sequela of chronic microvascular ischemic disease. Remote LEFT pontine infarction. Vascular: Vascular calcifications. Skull: No acute fracture. 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 Sinuses/Orbits: Chronic LEFT maxillary sinusitis. Chronic LEFT mastoid effusion. Other: None IMPRESSION: 1. No acute intracranial abnormality. Electronically Signed   By: Valentino Saxon M.D.   On: 12/31/2022 20:04    Microbiology: Results for orders placed or performed during the hospital encounter of 06/26/20  SARS CORONAVIRUS 2 (TAT 6-24 HRS) Nasopharyngeal Nasopharyngeal Swab     Status: None   Collection Time: 06/26/20  1:47 PM   Specimen: Nasopharyngeal Swab  Result Value Ref Range Status   SARS Coronavirus 2 NEGATIVE NEGATIVE Final    Comment: (NOTE) SARS-CoV-2 target nucleic acids are NOT DETECTED.  The SARS-CoV-2 RNA is generally detectable in upper and lower respiratory specimens during the acute phase of infection. Negative results do not preclude SARS-CoV-2 infection, do not rule out co-infections with other pathogens, and should not be used as the sole basis for treatment or other patient management decisions. Negative results must be combined with clinical observations, patient history, and epidemiological information. The expected result is Negative.  Fact Sheet for Patients: SugarRoll.be  Fact Sheet for Healthcare Providers: https://www.woods-mathews.com/  This test is not yet approved or cleared by the [place] FDA and  has been authorized for  detection and/or diagnosis of SARS-CoV-2 by FDA under an Emergency Use Authorization (EUA). This EUA will remain  in effect (meaning this test can be used) for the duration of the COVID-19 declaration under Se ction 564(b)(1) of the Act, 21 U.S.C. section 360bbb-3(b)(1), unless the authorization is terminated or revoked sooner.  Performed at [facility], [place] [address] [place], [place] [address]     Labs: CBC: Recent Labs  Lab 12/31/22 1927 01/01/23 0425  WBC 7.6 7.8  NEUTROABS 4.6 5.3  HGB 10.2* 9.8*  HCT 31.3* 29.9*  MCV 81.7 81.3  PLT 261 [id number]   Basic Metabolic Panel: Recent Labs  Lab 12/31/22 1927 01/01/23 0425  NA 132* 133*  K 3.7 3.6  CL 99 98  CO2 22 23  GLUCOSE 357* 307*  BUN 32* 32*  CREATININE 5.50* 5.54*  CALCIUM 6.9* 6.8*  MG  --  1.5*   Liver Function Tests: Recent Labs  Lab 12/31/22 1927 01/01/23 0425  AST 16 15  ALT 13 11  ALKPHOS 63 58  BILITOT 0.4 0.4  PROT 7.3 6.8  ALBUMIN 3.5 3.1*   CBG: Recent Labs  Lab 01/01/23 0715 01/01/23 1132 01/01/23 1615 01/01/23 2121 01/02/23 0719  GLUCAP 272* 350* 135* 253* 217*    Discharge time spent: greater than 30 minutes.  Signed: Barton Dubois, MD [facility] 01/02/2023

## 2023-01-02 NOTE — Assessment & Plan Note (Signed)
-  No frank normalities appreciated on EEG -Neurology service planning to follow-up as an outpatient -As needed clonazepam for seizure-like activity lasting more than 2 minutes has been recommended. -Precautions/restrictions discussed with patient.

## 2023-01-02 NOTE — Plan of Care (Signed)
  Problem: Education: Goal: Knowledge of General Education information will improve Description: Including pain rating scale, medication(s)/side effects and non-pharmacologic comfort measures 01/02/2023 1045 by Santa Lighter, RN Outcome: Adequate for Discharge 01/02/2023 0907 by Santa Lighter, RN Outcome: Progressing   Problem: Health Behavior/Discharge Planning: Goal: Ability to manage health-related needs will improve 01/02/2023 1045 by Santa Lighter, RN Outcome: Adequate for Discharge 01/02/2023 0907 by Santa Lighter, RN Outcome: Progressing   Problem: Clinical Measurements: Goal: Ability to maintain clinical measurements within normal limits will improve Outcome: Adequate for Discharge Goal: Will remain free from infection Outcome: Adequate for Discharge Goal: Diagnostic test results will improve Outcome: Adequate for Discharge Goal: Respiratory complications will improve Outcome: Adequate for Discharge Goal: Cardiovascular complication will be avoided Outcome: Adequate for Discharge   Problem: Activity: Goal: Risk for activity intolerance will decrease Outcome: Adequate for Discharge   Problem: Nutrition: Goal: Adequate nutrition will be maintained Outcome: Adequate for Discharge   Problem: Coping: Goal: Level of anxiety will decrease Outcome: Adequate for Discharge   Problem: Elimination: Goal: Will not experience complications related to bowel motility Outcome: Adequate for Discharge Goal: Will not experience complications related to urinary retention Outcome: Adequate for Discharge   Problem: Pain Managment: Goal: General experience of comfort will improve Outcome: Adequate for Discharge   Problem: Safety: Goal: Ability to remain free from injury will improve Outcome: Adequate for Discharge   Problem: Skin Integrity: Goal: Risk for impaired skin integrity will decrease Outcome: Adequate for Discharge   Problem: Education: Goal: Ability to describe  self-care measures that may prevent or decrease complications (Diabetes Survival Skills Education) will improve Outcome: Adequate for Discharge Goal: Individualized Educational Video(s) Outcome: Adequate for Discharge   Problem: Coping: Goal: Ability to adjust to condition or change in health will improve Outcome: Adequate for Discharge   Problem: Fluid Volume: Goal: Ability to maintain a balanced intake and output will improve Outcome: Adequate for Discharge   Problem: Health Behavior/Discharge Planning: Goal: Ability to identify and utilize available resources and services will improve Outcome: Adequate for Discharge Goal: Ability to manage health-related needs will improve Outcome: Adequate for Discharge   Problem: Metabolic: Goal: Ability to maintain appropriate glucose levels will improve Outcome: Adequate for Discharge   Problem: Nutritional: Goal: Maintenance of adequate nutrition will improve Outcome: Adequate for Discharge Goal: Progress toward achieving an optimal weight will improve Outcome: Adequate for Discharge   Problem: Skin Integrity: Goal: Risk for impaired skin integrity will decrease Outcome: Adequate for Discharge

## 2023-01-02 NOTE — Progress Notes (Signed)
Subjective:  No new c/o's-  MRI neg- EEG not completely negative-  dispo is unclear-  we were able to get him a later spot at the OP kidney center-  can run there if can get there by 1030.  He is not sure he even wants to go-  and tells me he DOES NOT WANT HD here  Objective Vital signs in last 24 hours: Vitals:   01/01/23 1055 01/01/23 1223 01/01/23 2016 01/02/23 0511  BP: (!) 172/71 (!) 175/67 (!) 193/75 (!) 157/70  Pulse: 70 65 61 (!) 58  Resp: '20 18 18 14  '$ Temp: 98.1 F (36.7 C) 98.4 F (36.9 C) 98 F (36.7 C) 98.3 F (36.8 C)  TempSrc: Oral  Oral Oral  SpO2: 100% 100% 100% 98%  Weight:      Height:       Weight change:   Intake/Output Summary (Last 24 hours) at 01/02/2023 0744 Last data filed at 01/02/2023 0700 Gross per 24 hour  Intake 480 ml  Output 300 ml  Net 180 ml   Dialyzes at [facility] 4 hours  EDW 77.5. HD Bath 3/2.5, Dialyzer unknown, Heparin yes 1000 per hour. Access AVF-  400 BFR. Mircera low dose 30 q 4 weeks and venofer 50 weekly      Assessment/Plan: 77 year old BM with ESRD -  presenting with sz like activity-  1 sz like activity-  per primary and neuro-  for work up including brain imaging 2 ESRD: normally TTS-  was planning for routine HD today - have arranged for him to go to OP HD this afternoon-  he has talked to his OP unit as well -  telling me he is tired and not sure he even wants to have dialysis today -  will decide later if he is going to go.  He did tell me though that he did not want dialysis here today  3 Hypertension/vol:  is 3 kg above EDW by weight today and is slightly  hyponatremic- will need moderate volume removal with his next HD 4. Anemia of ESRD: is slightly below the threshold of treatment today-  was going to give ESA and iron -  but he can resume at his OP kidney center 5. Metabolic Bone Disease: he is not on vitamin D-  also no binder on meds list   Louis Meckel    Labs: Basic Metabolic Panel: Recent Labs  Lab  12/31/22 1927 01/01/23 0425  NA 132* 133*  K 3.7 3.6  CL 99 98  CO2 22 23  GLUCOSE 357* 307*  BUN 32* 32*  CREATININE 5.50* 5.54*  CALCIUM 6.9* 6.8*   Liver Function Tests: Recent Labs  Lab 12/31/22 1927 01/01/23 0425  AST 16 15  ALT 13 11  ALKPHOS 63 58  BILITOT 0.4 0.4  PROT 7.3 6.8  ALBUMIN 3.5 3.1*   No results for input(s): "LIPASE", "AMYLASE" in the last 168 hours. No results for input(s): "AMMONIA" in the last 168 hours. CBC: Recent Labs  Lab 12/31/22 1927 01/01/23 0425  WBC 7.6 7.8  NEUTROABS 4.6 5.3  HGB 10.2* 9.8*  HCT 31.3* 29.9*  MCV 81.7 81.3  PLT 261 244   Cardiac Enzymes: No results for input(s): "CKTOTAL", "CKMB", "CKMBINDEX", "TROPONINI" in the last 168 hours. CBG: Recent Labs  Lab 01/01/23 0715 01/01/23 1132 01/01/23 1615 01/01/23 2121 01/02/23 0719  GLUCAP 272* 350* 135* 253* 217*    Iron Studies: No results for input(s): "IRON", "TIBC", "  TRANSFERRIN", "FERRITIN" in the last 72 hours. Studies/Results: MR ANGIO HEAD WO CONTRAST  Result Date: 01/01/2023 CLINICAL DATA:  Stroke, follow-up.  Aphasia. EXAM: MRA NECK WITHOUT CONTRAST MRA HEAD WITHOUT CONTRAST TECHNIQUE: Angiographic images of the Circle of Willis were acquired using MRA technique without intravenous contrast. COMPARISON:  MRA head and neck 04/06/2022 FINDINGS: MRA NECK FINDINGS Aortic arch: Limited assessment due to noncontrast technique and motion. Standard branching. Right carotid system: Patent with limited assessment of the proximal common carotid artery which is tortuous. No evidence of a significant stenosis or dissection in the remainder of the common carotid artery or included portion of the cervical ICA. Left carotid system: Patent without evidence of a significant stenosis or dissection in the common carotid artery or included portion of the cervical ICA. Vertebral arteries: Patent left vertebral artery with antegrade flow. Limited assessment of the left vertebral origin  without evidence of a significant stenosis or dissection more distally. Unchanged occlusion of the right vertebral artery at its origin without reconstitution in the neck. Other: None. MRA HEAD FINDINGS Anterior circulation: The internal carotid arteries are patent from skull base to carotid termini with similar appearance of mild to moderate right cavernous and bilateral proximal supraclinoid stenoses. ACAs and MCAs are patent without evidence of a proximal branch occlusion or significant M1 or left A1 stenosis. 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 An apparent severe stenosis of the right M2 inferior division is unchanged. No aneurysm is identified. Posterior circulation: The intracranial left vertebral artery is widely patent and supplies the basilar, and the intracranial right vertebral artery is chronically occluded. The basilar artery is patent with an unchanged mild stenosis in its midportion. There is a patent right posterior communicating artery. The PCAs are patent with left greater than right branch vessel attenuation. Anatomic variants: Aplastic right A1 segment. Other: None. IMPRESSION: 1. Chronic occlusion of the right vertebral artery. Limited assessment of the origin of the left vertebral artery which is otherwise widely patent. 2. Unchanged intracranial atherosclerosis including mild-to-moderate ICA stenoses, mild basilar artery stenosis, and a possible severe right M2 stenosis versus artifact. 3. Patent cervical carotid arteries without significant stenosis. Electronically Signed   By: Logan Bores M.D.   On: 01/01/2023 17:13   MR ANGIO NECK WO CONTRAST  Result Date: 01/01/2023 CLINICAL DATA:  Stroke, follow-up.  Aphasia. EXAM: MRA NECK WITHOUT CONTRAST MRA HEAD WITHOUT CONTRAST TECHNIQUE: Angiographic images of the Circle of Willis were acquired using MRA technique without intravenous contrast. COMPARISON:  MRA head and neck 04/06/2022 FINDINGS: MRA  NECK FINDINGS Aortic arch: Limited assessment due to noncontrast technique and motion. Standard branching. Right carotid system: Patent with limited assessment of the proximal common carotid artery which is tortuous. No evidence of a significant stenosis or dissection in the remainder of the common carotid artery or included portion of the cervical ICA. Left carotid system: Patent without evidence of a significant stenosis or dissection in the common carotid artery or included portion of the cervical ICA. Vertebral arteries: Patent left vertebral artery with antegrade flow. Limited assessment of the left vertebral origin without evidence of a significant stenosis or dissection more distally. Unchanged occlusion of the right vertebral artery at its origin without reconstitution in the neck. Other: None. MRA HEAD FINDINGS Anterior circulation: The internal carotid arteries are patent from skull base to carotid termini with similar appearance of mild to moderate right cavernous and bilateral proximal supraclinoid stenoses. ACAs and MCAs are patent without evidence  of a proximal branch occlusion or significant M1 or left A1 stenosis. 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 An apparent severe stenosis of the right M2 inferior division is unchanged. No aneurysm is identified. Posterior circulation: The intracranial left vertebral artery is widely patent and supplies the basilar, and the intracranial right vertebral artery is chronically occluded. The basilar artery is patent with an unchanged mild stenosis in its midportion. There is a patent right posterior communicating artery. The PCAs are patent with left greater than right branch vessel attenuation. Anatomic variants: Aplastic right A1 segment. Other: None. IMPRESSION: 1. Chronic occlusion of the right vertebral artery. Limited assessment of the origin of the left vertebral artery which is otherwise widely patent. 2.  Unchanged intracranial atherosclerosis including mild-to-moderate ICA stenoses, mild basilar artery stenosis, and a possible severe right M2 stenosis versus artifact. 3. Patent cervical carotid arteries without significant stenosis. Electronically Signed   By: Logan Bores M.D.   On: 01/01/2023 17:13   EEG adult  Result Date: 01/01/2023 Lora Havens, MD     01/01/2023  2:40 PM Patient Name: MATTI KEPP MRN: [id number] Epilepsy Attending: Lora Havens Referring Physician/Provider: Rolla Plate, DO Date: 01/01/2023 Duration: 27.46 mins Patient history: 77yo m with seizure like episode. EEG to evaluate for seizure Level of alertness: Awake, asleep AEDs during EEG study: None Technical aspects: This EEG study was done with scalp electrodes positioned according to the 10-20 International system of electrode placement. Electrical activity was reviewed with band pass filter of 1-'70Hz'$ , sensitivity of 7 uV/mm, display speed of 53m/sec with a '60Hz'$  notched filter applied as appropriate. EEG data were recorded continuously and digitally stored.  Video monitoring was available and reviewed as appropriate. Description: The posterior dominant rhythm consists of 8 Hz activity of moderate voltage (25-35 uV) seen predominantly in posterior head regions, symmetric and reactive to eye opening and eye closing. Sleep was characterized by vertex waves, sleep spindles (12 to 14 Hz), maximal frontocentral region. EEG showed intermittent 3-'5hz'$  Hz theta-delta slowing in left temporal region.. Hyperventilation did not show any EEG change.  Physiologic photic driving was seen during photic stimulation.   ABNORMALITY - Intermittent slow, left temporal region IMPRESSION: This study is suggestive of nonspecific cortical dysfunction arising from left temporal region. No seizures or epileptiform discharges were seen throughout the recording. Please note that lack of epileptiform activity during interictal EEG does not exclude the  diagnosis of epilepsy. PLora Havens  MR BRAIN WO CONTRAST  Result Date: 01/01/2023 CLINICAL DATA:  Mental status change, unknown cause. EXAM: MRI HEAD WITHOUT CONTRAST TECHNIQUE: Multiplanar, multiecho pulse sequences of the brain and surrounding structures were obtained without intravenous contrast. COMPARISON:  Head CT 12/31/2022 and MRI/MRA 04/06/2022 FINDINGS: It was decided not to administer IV contrast due to chronic renal failure after review of the noncontrast images. Brain: There is no evidence of an acute infarct, intracranial hemorrhage, mass, midline shift, or extra-axial fluid collection. T2 hyperintensities in the cerebral white matter and pons are similar to the prior MRI and are nonspecific but compatible with mild chronic small vessel ischemic disease. 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 Mild cerebral atrophy is within normal limits for age. Vascular: Chronically occluded right vertebral artery. Other major intracranial vascular flow voids are preserved. Skull and upper cervical spine: No suspicious marrow lesion. Sinuses/Orbits: Chronic left maxillary sinusitis. Large left mastoid effusion, mildly  enlarged from the prior MRI. Left middle ear effusion. Other: None. IMPRESSION: 1. No acute intracranial abnormality. 2. Mild chronic small vessel ischemic disease. 3. Large left mastoid and middle ear effusions. Electronically Signed   By: Logan Bores M.D.   On: 01/01/2023 09:10   CT Head Wo Contrast  Result Date: 12/31/2022 CLINICAL DATA:  Delirium EXAM: CT HEAD WITHOUT CONTRAST TECHNIQUE: Contiguous axial images were obtained from the base of the skull through the vertex without intravenous contrast. RADIATION DOSE REDUCTION: This exam was performed according to the departmental dose-optimization program which includes automated exposure control, adjustment of the mA and/or kV according to patient size and/or use of iterative  reconstruction technique. COMPARISON:  April 06, 2022 FINDINGS: Brain: No evidence of acute infarction, hemorrhage, hydrocephalus, extra-axial collection or mass lesion/mass effect. Periventricular white matter hypodensities consistent with sequela of chronic microvascular ischemic disease. Remote LEFT pontine infarction. Vascular: Vascular calcifications. Skull: No acute fracture. 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 Sinuses/Orbits: Chronic LEFT maxillary sinusitis. Chronic LEFT mastoid effusion. Other: None IMPRESSION: 1. No acute intracranial abnormality. Electronically Signed   By: Valentino Saxon M.D.   On: 12/31/2022 20:04   Medications: Infusions:   Scheduled Medications:  aspirin EC  81 mg Oral Daily   atorvastatin  80 mg Oral q1800   carvedilol  6.25 mg Oral BID WC   diltiazem  240 mg Oral BID   heparin  5,000 Units Subcutaneous Q8H   hydrALAZINE  150 mg Oral BID   insulin aspart  0-5 Units Subcutaneous QHS   insulin aspart  0-6 Units Subcutaneous TID WC   insulin glargine-yfgn  6 Units Subcutaneous QHS   pantoprazole  40 mg Oral Daily    have reviewed scheduled and prn medications.  Physical Exam: General:  NAD-  anxiously awaiting the plan so he can get out of here Heart:RRR Lungs: mostly clear Abdomen: soft, non tender Extremities: no edema  Dialysis Access: left upper arm AVF-  good thrill and bruit    01/02/2023,7:44 AM  LOS: 0 days

## 2023-01-08 ENCOUNTER — Ambulatory Visit: Payer: Self-pay | Admitting: *Deleted

## 2023-01-08 NOTE — Patient Outreach (Signed)
  Care Coordination   Initial Visit Note   01/08/2023 Name: Omar Parker MRN: [id number] DOB: [birth year]  Omar Parker is a 77 y.o. year old male who sees Redmond School, MD for primary care. I spoke with  Omar Parker by phone today.  What matters to the patients health and wellness today?  No concerns today. Patient did not want to complete initial telephone call today because he had a visitor.     Goals Addressed             This Visit's Progress    Care Coordination Services       Care Coordination Goals: Patient will follow-up with PCP s/p hosp discharge on 01/02/23 for Acute metabolic encephalopathy and seizure like activity Patient will talk with RN Care coordinator at scheduled time on 01/12/23 at 2:15 Patient will reach out to [place] Coordinator at [phone] sooner if necessary Patient will seek emergency medical attention if needed        SDOH assessments and interventions completed:  No   Care Coordination Interventions:  Yes, provided  Interventions Today    Flowsheet Row Most Recent Value  Chronic Disease   Chronic disease during today's visit --  [Recent hosp visit for metabolic encephalopathy and seizure-like activity]  General Interventions   General Interventions Discussed/Reviewed Doctor Visits  Doctor Visits Discussed/Reviewed Doctor Visits Discussed, Doctor Visits Reviewed, PCP  [Reviewed recent hospital notes, imaging, and lab reports]  PCP/Specialist Visits Compliance with follow-up visit  [Encouraged to f/u with PCP. Patient was unavailable to complete Initial TOC intake today. Rescheduled for 01/12/23. Patient is aware of date and time and that this is an appointment.]       Follow up plan: Follow up call scheduled for 01/12/23    Encounter Outcome:  Pt. Scheduled   Chong Sicilian, BSN, RN-BC [place]: [phone] Main #: [phone]

## 2023-01-12 ENCOUNTER — Ambulatory Visit: Payer: Self-pay | Admitting: *Deleted

## 2023-01-12 ENCOUNTER — Encounter: Payer: Self-pay | Admitting: *Deleted

## 2023-01-12 NOTE — Patient Outreach (Signed)
Care Coordination   Initial Visit Note   01/12/2023 Name: Omar Parker MRN: [id number] DOB: [birth year]  Omar Parker is a 77 y.o. year old male who sees Redmond School, MD for primary care. I spoke with  Omar Parker by phone today.  What matters to the patients health and wellness today?  Ongoing self management of chronic medical conditions   Goals Addressed             This Visit's Progress    COMPLETED: Care Coordination Services (no followup required)       Care Coordination Goals: Patient will follow-up with PCP and/or specialist(s) as recommended [facility] Patient will take medications as prescribed Patient will reach out to [place] Management Dept at [phone] with any care coordination or resource needs  Patient will utilize the [facility] 24 Hour Nurse/Concierge Line as needed [phone]      COMPLETED: Manage Blood Pressure       Care Coordination Goals: Patient will take medications as directed and report any negative side effects to provider  Patient will use a pill box/organizer to help keep up with when to take medications Patient will monitor and record blood pressure daily and as needed and will call PCP or specialist with any readings outside of recommended range Patient will keep all recommended follow-up appointments with PCP and specialists (cardiology, nephrology, etc) Patient will take blood pressure log to PCP and specialty appointments for review Patient will follow a low sodium/DASH diet  Patient will reach out to [place] Coordinator [phone] with any care coordination or resource needs       COMPLETED: Manage Blood Sugar       Care Coordination Goals: Patient will follow-up with PCP and/or endocrinologist every 3 months or as recommended Patient will take medication as prescribed and reach out to provider with any negative side effects Patient will continue to monitor and record blood sugar 2 times per day  and as needed with glucometer, and will call PCP or endocrinologist with any readings outside of recommended range Patient will take blood sugar log and meter to provider visits for review Patient will follow a modified carbohydrate diet and decrease simple carbohydrates and sugars Patient will increase activity level as tolerated with an ultimate goal of at least 150 minutes of exercise per week Patient will check feet daily for sores, wounds, calluses, etc and will notify provider of any abnormal findings Patient will have yearly eye exams to check for or monitor diabetic retinopathy Patient will reach out to [place] Coordinator [phone] with any care coordination or resource needs         SDOH assessments and interventions completed:  Yes  SDOH Interventions Today    Flowsheet Row Most Recent Value  SDOH Interventions   Food Insecurity Interventions Intervention Not Indicated  Transportation Interventions Intervention Not Indicated  Financial Strain Interventions Intervention Not Indicated        Care Coordination Interventions:  Yes, provided  Interventions Today    Flowsheet Row Most Recent Value  Chronic Disease   Chronic disease during today's visit Diabetes, Hypertension (HTN)  General Interventions   General Interventions Discussed/Reviewed Doctor Visits, General Interventions Discussed, General Interventions Reviewed, Durable Medical Equipment (DME), Annual Eye Exam, Labs, Annual Foot Exam, Community Resources  Labs Hgb A1c every 6 months, Kidney Function  Doctor Visits Discussed/Reviewed Doctor Visits Discussed, Doctor Visits Reviewed, PCP, Specialist  [[facility]]  Durable Medical Equipment (DME) BP Cuff, Glucomoter  PCP/Specialist Visits Compliance with follow-up visit  Exercise Interventions   Exercise Discussed/Reviewed Physical Activity  Physical Activity Discussed/Reviewed Physical Activity Reviewed  Education Interventions   Education Provided Provided  Education  Provided Verbal Education On Nutrition, Foot Care, Labs, Eye Care, Blood Sugar Monitoring, Medication, Mental Health/Coping with Illness, When to see the doctor, [employer] Reviewed Hgb A1c, Kidney Function, Lipid Profile  Nutrition Interventions   Nutrition Discussed/Reviewed Nutrition Reviewed, Carbohydrate meal planning, Decreasing sugar intake, Portion sizes  Pharmacy Interventions   Pharmacy Dicussed/Reviewed Medications and their functions  [updated medication list. Taking 50U of Lantus qhs and not 6U. [facility]aged by [facility]]  Safety Interventions   Safety Discussed/Reviewed Safety Discussed       Follow up plan: No further intervention required. Patient declined continued follow-up with Care Management Team.  Encounter Outcome:  Pt. Visit Completed   Chong Sicilian, BSN, RN-BC RN Care Coordinator [place] Direct Dial: [phone] Main #: [phone]

## 2023-01-26 ENCOUNTER — Encounter: Payer: Self-pay | Admitting: Neurology

## 2023-01-26 ENCOUNTER — Ambulatory Visit (INDEPENDENT_AMBULATORY_CARE_PROVIDER_SITE_OTHER): Payer: Medicare PPO | Admitting: Neurology

## 2023-01-26 VITALS — BP 149/68 | HR 56 | Ht 71.0 in | Wt 176.5 lb

## 2023-01-26 DIAGNOSIS — R569 Unspecified convulsions: Secondary | ICD-10-CM | POA: Diagnosis not present

## 2023-01-26 MED ORDER — VALTOCO 15 MG DOSE 7.5 MG/0.1ML NA LQPK: 15.0000 mg | NASAL | 0 refills | Status: DC | PRN

## 2023-01-26 NOTE — Patient Instructions (Addendum)
Continue current medications Will prescribe patient Valtoco and discontinue the Klonopin Driving restriction for the next 6 months  Continue to follow with your PCP and return if if worse or any other concerns

## 2023-01-26 NOTE — Progress Notes (Signed)
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  Patient reported he was sitting with his wife having lunch then collapsed, he does not remember much of the event.  Wife stated that patient was talking and then had  a speech arrest, seemed like he was slurring his words with hands automatism, then he his head went back on the chair and he started having convulsions.  This lasted about 2 minutes.  EMS was called.  Patient was taken to the ED, had full workup including MRI and EEG.  MRI negative for acute stroke and EEG negative for epileptiform discharges. Patient was thought to have a seizure or seizure-like event.  He was not started on any antiseizure medication but was put on Klonopin for seizure activity lasting more than 2 minutes.  Since discharge from the hospital he has not had any additional event.  He is back to his normal self.  He denies any previous history of seizures, denies any family history of seizure and no seizure risk factors.  Handedness: Right handed   Onset: December 31 2022   Seizure Type: Speech arrest, ?hand automatism, eyes rolled back then convulsion   Current frequency: Only one   Any injuries from seizures: Denies   Seizure risk factors: ESRD on dialysis   Previous ASMs: None   Currenty ASMs: None   ASMs side effects: N/A   Brain Images: No acute abnormality on MRI   Previous EEGs: Diffuse slowing     Hospital Summary and Course  Omar Parker is a 76 y.o. 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  It was described to the ED provider as the patient had his eyes rolled back in his head to the right, he had lipsmacking, shaking, and the episode was self aborting.  The patient reports he remembers the whole thing.  He reports he was in the middle of conversation when he lost his speech.  He knew what he wanted to say but could not speak.  He was not drooling.  He had no difficulty swallowing.  He reports that he had tremors around his mouth but not in his extremities.  He said the episode lasted a few minutes.  During that time he had no headache, chest pain, shortness of breath.  He had no numbness or asymmetrical weakness.  He just could not get his words out.  On review of systems patient reports he was in his normal state of health. Neuro was consulted from the ED and recommended MRI and EEG to evaluate for stroke versus seizure. Patient does not smoke, does not drink, does not use illicit drugs.  He was vaccinated for the flu but not COVID.   Seizure-like activity ([place]) -No frank normalities appreciated on EEG -Neurology service planning to follow-up as an outpatient -As needed clonazepam for  seizure-like activity lasting more than 2 minutes has been recommended. -Precautions/restrictions discussed with patient.  OTHER MEDICAL CONDITIONS: ESRD on Dialysis, Hypertension, hyperlipidemia, diabetes  REVIEW OF SYSTEMS: Full 14 system review of systems performed and negative with exception of: As noted in the HPI   ALLERGIES: Allergies  Allergen Reactions   Ace Inhibitors Swelling   Atorvastatin     Sweating and pt reports interferes w/diabetes &bp    HOME MEDICATIONS: Outpatient Medications Prior to  Visit  Medication Sig Dispense Refill   acetaminophen (TYLENOL) 325 MG tablet Take 650 mg by mouth every 6 (six) hours as needed for moderate pain.      aspirin EC 81 MG tablet Take 1 tablet (81 mg total) by mouth daily. Swallow whole. 30 tablet 12   carvedilol (COREG) 12.5 MG tablet Take 1 tablet (12.5 mg total) by mouth 2 (two) times daily with a meal. 60 tablet 3   diltiazem (CARDIZEM CD) 240 MG 24 hr capsule Take 1 capsule (240 mg total) by mouth 2 (two) times daily.     hydrALAZINE (APRESOLINE) 100 MG tablet Take 1 tablet (100 mg total) by mouth 3 (three) times daily. 90 tablet 2   insulin glargine (LANTUS) 100 UNIT/ML injection Inject 0.06 mLs (6 Units total) into the skin at bedtime. (Patient taking differently: Inject 50 Units into the skin at bedtime.)     pantoprazole (PROTONIX) 40 MG tablet Take 40 mg by mouth daily.      pravastatin (PRAVACHOL) 80 MG tablet Take 40 mg by mouth daily.     clonazePAM (KLONOPIN) 1 MG tablet Take 1 tablet (1 mg total) by mouth every 12 (twelve) hours as needed (seizure like activity lasting more than 2 minutes). 6 tablet 0   atorvastatin (LIPITOR) 80 MG tablet Take 1 tablet (80 mg total) by mouth daily at 6 PM. (Patient not taking: Reported on 01/26/2023) 30 tablet 1   No facility-administered medications prior to visit.    PAST MEDICAL HISTORY: Past Medical History:  Diagnosis Date   Anemia    IDA   Diabetes mellitus without complication (HCC)    GERD (gastroesophageal reflux disease)    High cholesterol    Hypertension    Stage 4 chronic kidney disease ([place])     PAST SURGICAL HISTORY: Past Surgical History:  Procedure Laterality Date   AV FISTULA PLACEMENT Left 06/28/2020   Procedure: LEFT UPPER ARM ARTERIOVENOUS (AV) GORE-TEX GRAFT;  Surgeon: Elam Dutch, MD;  Location: [facility];  Service: Vascular;  Laterality: Left;   COLONOSCOPY N/A 05/16/2017   Procedure: COLONOSCOPY;  Surgeon: Rogene Houston, MD;  [facility];  Service:  Endoscopy;  Laterality: N/A;  7:30   COLONOSCOPY N/A 04/10/2020   Procedure: COLONOSCOPY;  Surgeon: Daneil Dolin, MD;  [facility];  Service: Endoscopy;  Laterality: N/A;   ESOPHAGOGASTRODUODENOSCOPY N/A 04/10/2020   Procedure: ESOPHAGOGASTRODUODENOSCOPY (EGD);  Surgeon: Daneil Dolin, MD;  [facility];  Service: Endoscopy;  Laterality: N/A;   HERNIA REPAIR     INSERTION OF DIALYSIS CATHETER  06/28/2020   Procedure: INSERTION OF DIALYSIS CATHETER;  Surgeon: Elam Dutch, MD;  Location: [facility] OR;  Service: Vascular;;   IR REMOVAL TUN CV CATH W/O FL  09/21/2020   POLYPECTOMY  04/10/2020   Procedure: POLYPECTOMY;  Surgeon: Daneil Dolin, MD;  [facility];  Service: Endoscopy;;  cecal;    FAMILY HISTORY: Family History  Problem Relation Age of Onset   Cancer  Mother    Cancer Sister    Chronic Renal Failure Neg Hx     SOCIAL HISTORY: Social History   Socioeconomic History   Marital status: Married    Spouse name: Not on file   Number of children: 2   Years of education: Not on file   Highest education level: 12th grade  Occupational History   Occupation: distribution work  Tobacco Use   Smoking status: Never   Smokeless tobacco: Never  Vaping Use   Vaping Use: Never used  Substance and Sexual Activity   Alcohol use: Not Currently    Comment: no h/o heavy use   Drug use: Not Currently    Types: Marijuana    Comment: none in 20 years   Sexual activity: Yes  Other Topics Concern   Not on file  Social History Narrative   Not on file   Social Determinants of Health   Financial Resource Strain: Low Risk  (01/12/2023)   Overall Financial Resource Strain (CARDIA)    Difficulty of Paying Living Expenses: Not very hard  Food Insecurity: No Food Insecurity (01/12/2023)   Hunger Vital Sign    Worried About Running Out of Food in the Last Year: Never true    Ran Out of Food in the Last Year: Never true  Transportation Needs: No  Transportation Needs (01/12/2023)   PRAPARE - Hydrologist (Medical): No    Lack of Transportation (Non-Medical): No  Physical Activity: Not on file  Stress: Not on file  Social Connections: Not on file  Intimate Partner Violence: Not At Risk (01/01/2023)   Humiliation, Afraid, Rape, and Kick questionnaire    Fear of Current or Ex-Partner: No    Emotionally Abused: No    Physically Abused: No    Sexually Abused: No    PHYSICAL EXAM  GENERAL EXAM/CONSTITUTIONAL: Vitals:  Vitals:   01/26/23 0943  BP: (!) 149/68  Pulse: (!) 56  Weight: 176 lb 8 oz (80.1 kg)  Height: 5\' 11"  (1.803 m)   Body mass index is 24.62 kg/m. Wt Readings from Last 3 Encounters:  01/26/23 176 lb 8 oz (80.1 kg)  01/01/23 177 lb 14.4 oz (80.7 kg)  04/06/22 170 lb 3.1 oz (77.2 kg)   Patient is in no distress; well developed, nourished and groomed; neck is supple  EYES: Visual fields full to confrontation, Extraocular movements intacts,  No results found.  MUSCULOSKELETAL: Gait, strength, tone, movements noted in Neurologic exam below  NEUROLOGIC: MENTAL STATUS:      No data to display         awake, alert, oriented to person, place and time recent and remote memory intact normal attention and concentration language fluent, comprehension intact, naming intact fund of knowledge appropriate  CRANIAL NERVE:  2nd, 3rd, 4th, 6th - Visual fields full to confrontation, extraocular muscles intact, no nystagmus 5th - facial sensation symmetric 7th - facial strength symmetric 8th - hearing intact 9th - palate elevates symmetrically, uvula midline 11th - shoulder shrug symmetric 12th - tongue protrusion midline  MOTOR:  normal bulk and tone, full strength in the BUE, BLE  SENSORY:  normal and symmetric to light touch  COORDINATION:  finger-nose-finger, fine finger movements normal  REFLEXES:  deep tendon reflexes present and symmetric  GAIT/STATION:   normal   DIAGNOSTIC DATA (LABS, IMAGING, TESTING) - I reviewed patient records, labs, notes, testing and imaging myself where available.  Lab Results  Component Value Date   WBC  7.8 01/01/2023   HGB 9.8 (L) 01/01/2023   HCT 29.9 (L) 01/01/2023   MCV 81.3 01/01/2023   PLT 244 01/01/2023      Component Value Date/Time   NA 133 (L) 01/01/2023 0425   K 3.6 01/01/2023 0425   CL 98 01/01/2023 0425   CO2 23 01/01/2023 0425   GLUCOSE 307 (H) 01/01/2023 0425   BUN 32 (H) 01/01/2023 0425   CREATININE 5.54 (H) 01/01/2023 0425   CALCIUM 6.8 (L) 01/01/2023 0425   PROT 6.8 01/01/2023 0425   ALBUMIN 3.1 (L) 01/01/2023 0425   AST 15 01/01/2023 0425   ALT 11 01/01/2023 0425   ALKPHOS 58 01/01/2023 0425   BILITOT 0.4 01/01/2023 0425   GFRNONAA 10 (L) 01/01/2023 0425   GFRAA 11 (L) 04/10/2020 0637   Lab Results  Component Value Date   CHOL 197 04/07/2022   HDL 29 (L) 04/07/2022   LDLCALC 137 (H) 04/07/2022   TRIG 154 (H) 04/07/2022   Lab Results  Component Value Date   HGBA1C 10.2 (H) 01/01/2023   Lab Results  Component Value Date   VITAMINB12 548 04/09/2020   Lab Results  Component Value Date   TSH 3.724 01/01/2023    MRI Brain 01/01/2023 1. No acute intracranial abnormality. 2. Mild chronic small vessel ischemic disease.   Routine EEG 01/01/2023 - Intermittent slow, left temporal region IMPRESSION: This study is suggestive of nonspecific cortical dysfunction arising from left temporal region. No seizures or epileptiform discharges were seen throughout the recording. Please note that lack of epileptiform activity during interictal EEG does not exclude the diagnosis of epilepsy   I personally reviewed brain Images and previous EEG reports.   ASSESSMENT AND PLAN  77 y.o. 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  For now we will continue to observe patient, I will prescribe him a Valtoco instead of the Klonopin to use if he has another event.  They understand also to contact me if he has another event.  We discussed driving restriction for the next 6 months.  They voice understanding. Continue to follow up with PCP and return as needed.    1. Seizure-like activity [facility])     Patient Instructions  Continue current medications Will prescribe patient Valtoco and discontinue the Klonopin Driving restriction for the next 6 months  Continue to follow with your PCP and return if if worse or any other concerns     Per [facility] statutes, patients with seizures are not allowed to drive until they have been seizure-free for six months.  Other recommendations include using caution when using heavy equipment or power tools. Avoid working on ladders or at heights. Take showers instead of baths.  Do not swim alone.  Ensure the water temperature is not too high on the home water heater. Do not go swimming alone. Do not lock yourself in a room alone (i.e. bathroom). When caring for infants or small children, sit down when holding, feeding, or changing them to minimize risk of injury to the child in the event you have a seizure. Maintain good sleep hygiene. Avoid alcohol.  Also recommend adequate sleep, hydration, good diet and minimize stress.   During the Seizure  - First, ensure adequate ventilation and place patients on the floor on their left side  Loosen clothing around the neck and  ensure the airway is patent. If the patient is clenching the teeth, do not force the mouth open with any object as this can cause severe damage - Remove all items from the surrounding that can be hazardous. The patient may be oblivious to what's happening and may not even know what he or she is doing. If the patient is confused and wandering, either gently guide him/her away and block  access to outside areas - Reassure the individual and be comforting - Call 911. In most cases, the seizure ends before EMS arrives. However, there are cases when seizures may last over 3 to 5 minutes. Or the individual may have developed breathing difficulties or severe injuries. If a pregnant patient or a person with diabetes develops a seizure, it is prudent to call an ambulance. - Finally, if the patient does not regain full consciousness, then call EMS. Most patients will remain confused for about 45 to 90 minutes after a seizure, so you must use judgment in calling for help. - Avoid restraints but make sure the patient is in a bed with padded side rails - Place the individual in a lateral position with the neck slightly flexed; this will help the saliva drain from the mouth and prevent the tongue from falling backward - Remove all nearby furniture and other hazards from the area - Provide verbal assurance as the individual is regaining consciousness - Provide the patient with privacy if possible - Call for help and start treatment as ordered by the caregiver   After the Seizure (Postictal Stage)  After a seizure, most patients experience confusion, fatigue, muscle pain and/or a headache. Thus, one should permit the individual to sleep. For the next few days, reassurance is essential. Being calm and helping reorient the person is also of importance.  Most seizures are painless and end spontaneously. Seizures are not harmful to others but can lead to complications such as stress on the lungs, brain and the heart. Individuals with prior lung problems may develop labored breathing and respiratory distress.     No orders of the defined types were placed in this encounter.   Meds ordered this encounter  Medications   diazePAM, 15 MG Dose, (VALTOCO 15 MG DOSE) 2 x 7.5 MG/0.1ML LQPK    Sig: Place 15 mg into the nose as needed (For seizure lasting more than 2 minutes).    Dispense:  2 each     Refill:  0    Return if symptoms worsen or fail to improve.    Alric Ran, MD 01/26/2023, 11:49 AM  [facility] [address] [place], [place] [address] [phone]

## 2023-04-30 DIAGNOSIS — J020 Streptococcal pharyngitis: Secondary | ICD-10-CM | POA: Diagnosis not present

## 2023-04-30 DIAGNOSIS — Z6824 Body mass index (BMI) 24.0-24.9, adult: Secondary | ICD-10-CM | POA: Diagnosis not present

## 2023-05-09 ENCOUNTER — Other Ambulatory Visit (HOSPITAL_COMMUNITY): Payer: Self-pay | Admitting: Internal Medicine

## 2023-05-09 DIAGNOSIS — Z992 Dependence on renal dialysis: Secondary | ICD-10-CM | POA: Diagnosis not present

## 2023-05-09 DIAGNOSIS — I10 Essential (primary) hypertension: Secondary | ICD-10-CM | POA: Diagnosis not present

## 2023-05-09 DIAGNOSIS — N185 Chronic kidney disease, stage 5: Secondary | ICD-10-CM | POA: Diagnosis not present

## 2023-05-09 DIAGNOSIS — Z0001 Encounter for general adult medical examination with abnormal findings: Secondary | ICD-10-CM | POA: Diagnosis not present

## 2023-05-09 DIAGNOSIS — G9332 Myalgic encephalomyelitis/chronic fatigue syndrome: Secondary | ICD-10-CM | POA: Diagnosis not present

## 2023-05-09 DIAGNOSIS — M1991 Primary osteoarthritis, unspecified site: Secondary | ICD-10-CM | POA: Diagnosis not present

## 2023-05-09 DIAGNOSIS — E1129 Type 2 diabetes mellitus with other diabetic kidney complication: Secondary | ICD-10-CM | POA: Diagnosis not present

## 2023-05-09 DIAGNOSIS — D518 Other vitamin B12 deficiency anemias: Secondary | ICD-10-CM | POA: Diagnosis not present

## 2023-05-09 DIAGNOSIS — E559 Vitamin D deficiency, unspecified: Secondary | ICD-10-CM | POA: Diagnosis not present

## 2023-05-09 DIAGNOSIS — Z125 Encounter for screening for malignant neoplasm of prostate: Secondary | ICD-10-CM | POA: Diagnosis not present

## 2023-05-09 DIAGNOSIS — Z136 Encounter for screening for cardiovascular disorders: Secondary | ICD-10-CM

## 2023-05-09 DIAGNOSIS — E782 Mixed hyperlipidemia: Secondary | ICD-10-CM | POA: Diagnosis not present

## 2023-05-09 DIAGNOSIS — R011 Cardiac murmur, unspecified: Secondary | ICD-10-CM | POA: Diagnosis not present

## 2023-05-09 DIAGNOSIS — E1165 Type 2 diabetes mellitus with hyperglycemia: Secondary | ICD-10-CM | POA: Diagnosis not present

## 2023-05-21 ENCOUNTER — Ambulatory Visit (HOSPITAL_COMMUNITY)
Admission: RE | Admit: 2023-05-21 | Discharge: 2023-05-21 | Disposition: A | Discharge status: Home / Self Care | Payer: Medicare PPO | Source: Ambulatory Visit | Attending: Internal Medicine | Admitting: Internal Medicine

## 2023-05-21 DIAGNOSIS — I6523 Occlusion and stenosis of bilateral carotid arteries: Secondary | ICD-10-CM | POA: Diagnosis not present

## 2023-05-21 DIAGNOSIS — Z136 Encounter for screening for cardiovascular disorders: Secondary | ICD-10-CM | POA: Diagnosis not present

## 2023-05-21 DIAGNOSIS — E785 Hyperlipidemia, unspecified: Secondary | ICD-10-CM | POA: Diagnosis not present

## 2023-05-21 DIAGNOSIS — I10 Essential (primary) hypertension: Secondary | ICD-10-CM | POA: Diagnosis not present

## 2023-08-31 DIAGNOSIS — I10 Essential (primary) hypertension: Secondary | ICD-10-CM | POA: Diagnosis not present

## 2023-08-31 DIAGNOSIS — Z992 Dependence on renal dialysis: Secondary | ICD-10-CM | POA: Diagnosis not present

## 2023-08-31 DIAGNOSIS — H6982 Other specified disorders of Eustachian tube, left ear: Secondary | ICD-10-CM | POA: Diagnosis not present

## 2023-08-31 DIAGNOSIS — Z6824 Body mass index (BMI) 24.0-24.9, adult: Secondary | ICD-10-CM | POA: Diagnosis not present

## 2023-08-31 DIAGNOSIS — E1129 Type 2 diabetes mellitus with other diabetic kidney complication: Secondary | ICD-10-CM | POA: Diagnosis not present

## 2023-09-13 IMAGING — MR MR MRA NECK W/O CM
1 series · 36 of 48 positions shown · non-contrast
Comparison: No prior MRI, correlation is made with CT head
04/06/2022

CLINICAL DATA: Vertigo, stroke suspected

EXAM:
MRI HEAD WITHOUT CONTRAST
MRA HEAD WITHOUT CONTRAST
MRA NECK WITHOUT CONTRAST
TECHNIQUE: Multiplanar, multi-echo pulse sequences of the brain and surrounding
structures were acquired without intravenous contrast. Angiographic
images of the Circle of Willis were acquired using MRA technique
without intravenous contrast. Angiographic images of the neck were
acquired using MRA technique without intravenous contrast. Carotid
stenosis measurements (when applicable) are obtained utilizing
NASCET criteria, using the distal internal carotid diameter as the
denominator.

[Series 103: [id number] · axial · 0.6mm · 0.52mm/px · z∈[-171,-67]mm · 36 of 195 slices shown]
[im 1/195]
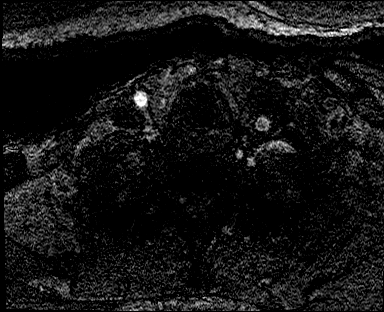
[im 5/195]
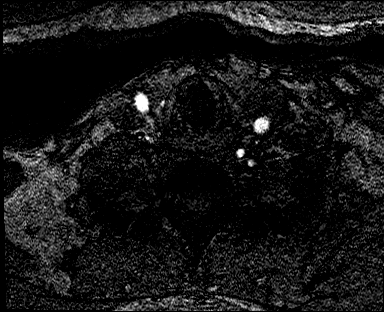
[im 9/195]
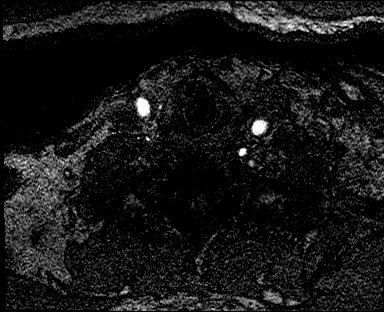
[im 13/195]
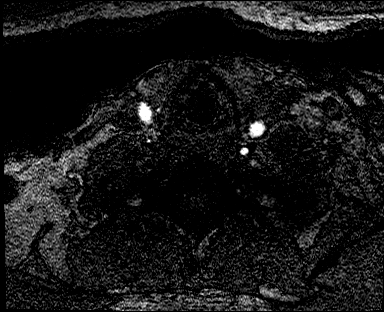
[im 17/195]
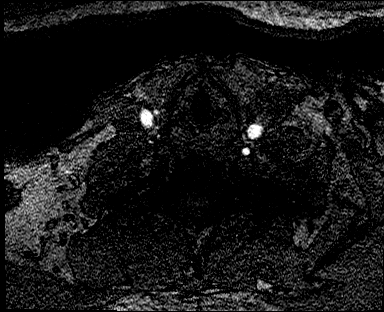
[im 21/195]
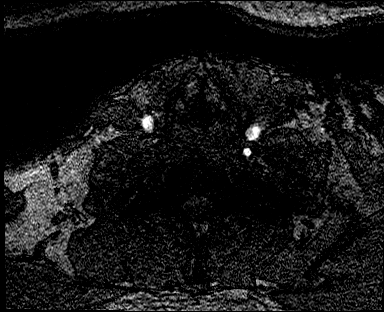
[im 25/195]
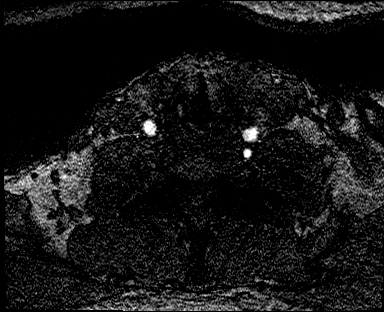
[im 29/195]
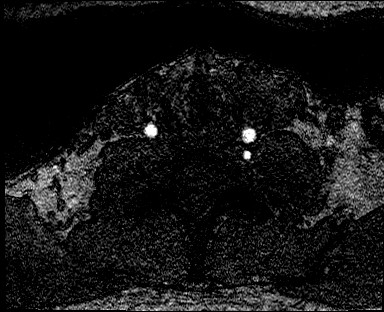
[im 34/195]
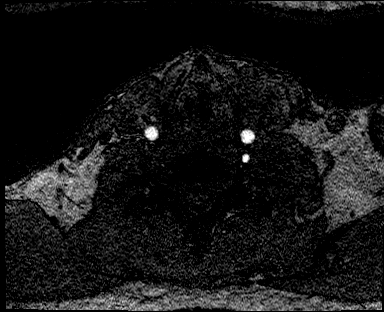
[im 38/195]
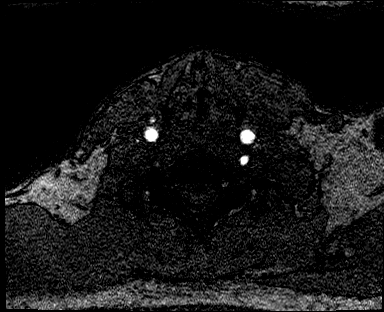
[im 42/195]
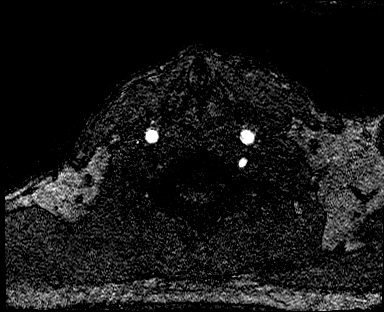
[im 46/195]
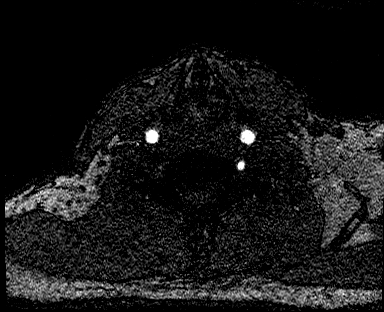
[im 50/195]
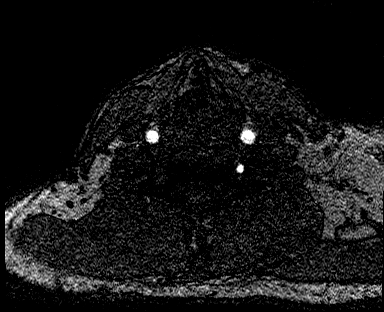
[im 54/195]
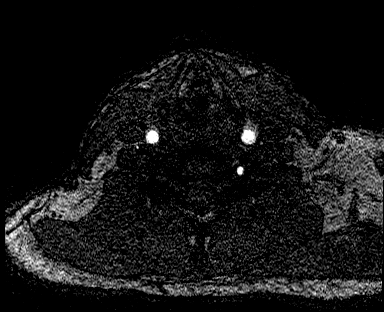
[im 58/195]
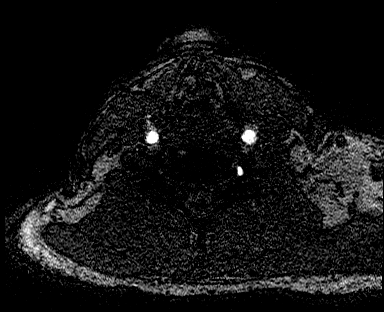
[im 62/195]
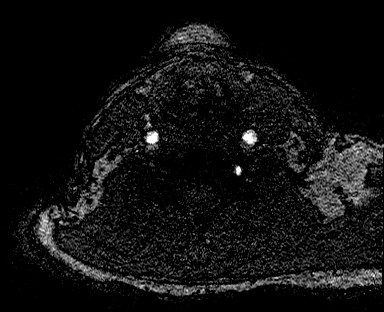
[im 67/195]
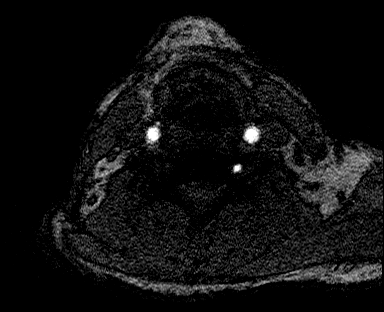
[im 71/195]
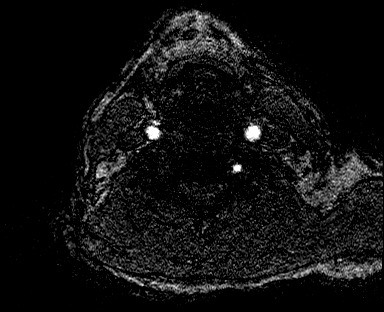
[im 75/195]
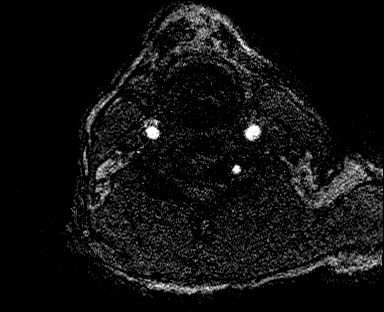
[im 79/195]
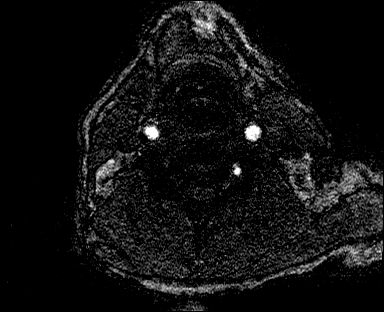
[im 83/195]
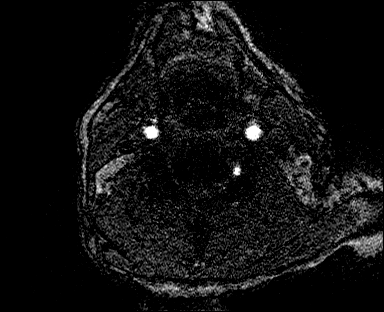
[im 87/195]
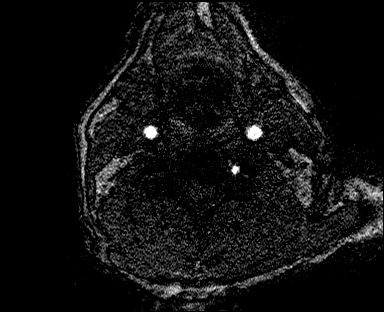
[im 91/195]
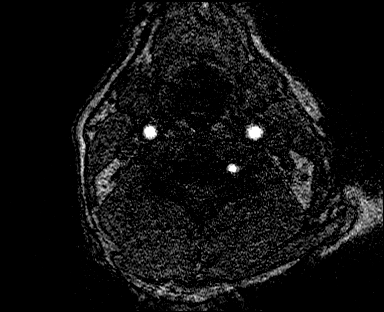
[im 95/195]
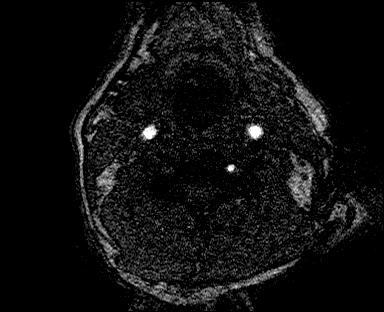
[im 100/195]
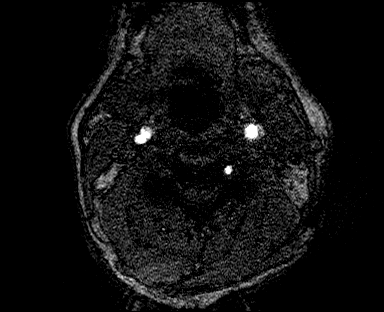
[im 104/195]
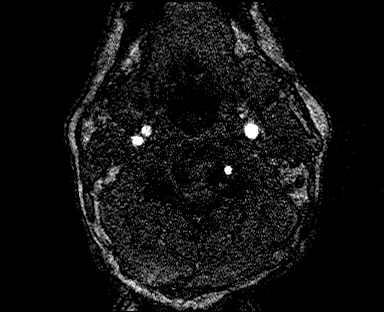
[im 108/195]
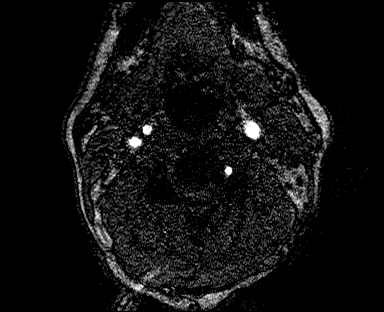
[im 112/195]
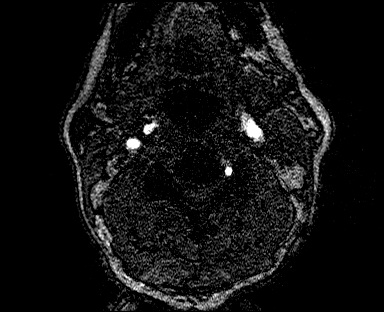
[im 116/195]
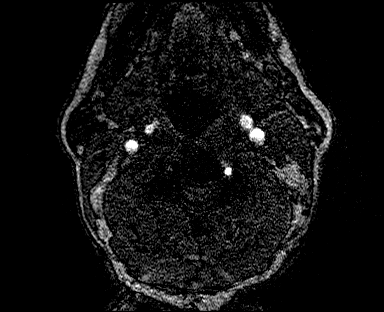
[im 120/195]
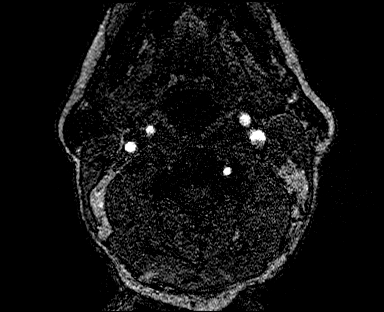
[im 124/195]
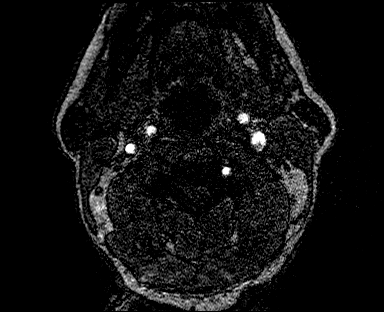
[im 128/195]
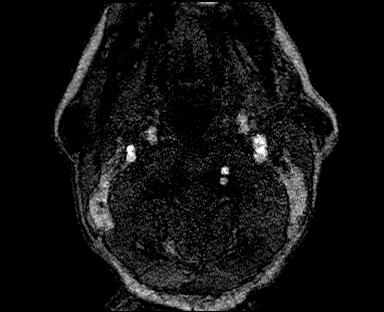
[im 137/195]
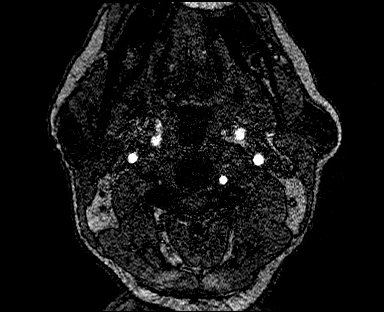
[im 161/195]
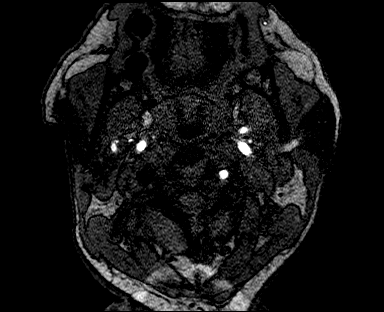
[im 166/195]
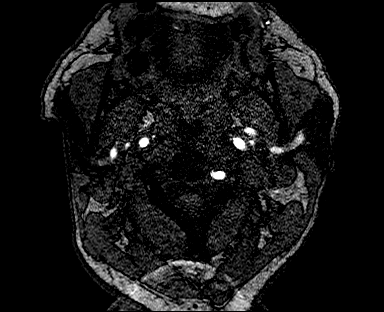
[im 186/195]
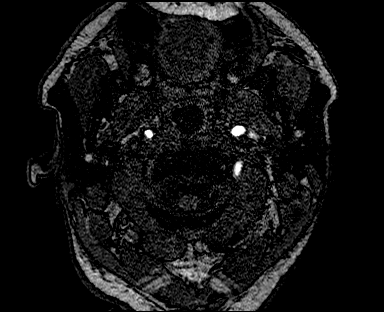

[36 of 48 positions shown; findings below may reference images not displayed]

FINDINGS: MRI HEAD FINDINGS

Brain: Restricted diffusion with ADC correlate in the posterior left
medial pons (series 5, image 9 and series 7, image 12), which
measures up to 8 mm in greatest dimension. This area is associated
with mildly increased T2 hyperintense signal.

No acute hemorrhage, mass, mass effect, or midline shift. No
hydrocephalus or extra-axial collection. Scattered T2 hyperintense
signal in the periventricular white matter and pons, likely the
sequela of mild moderate chronic small vessel ischemic disease.
Cerebral volume is within normal limits for age.

Vascular: Please see MRA findings below.

Skull and upper cervical spine: Normal marrow signal.

Sinuses/Orbits: Chronic left maxillary sinusitis. The orbits are
unremarkable.

Other: Fluid throughout the left mastoid air cells.

MRA HEAD FINDINGS

Anterior circulation: Both internal carotid arteries are patent to
the termini, with moderate narrowing in the right cavernous and
proximal supraclinoid ICA. Mild narrowing in the left proximal
supraclinoid ICA.

Patent left A1. The right A1 is not visualized and may be aplastic
or stenotic. Normal anterior communicating artery. Anterior cerebral
arteries are patent to their distal aspects.

No M1 stenosis or occlusion. Normal MCA bifurcations. Possible
moderate narrowing in the proximal right inferior M2 branch (series
100, image 90 and 93). Distal MCA branches otherwise well perfused.

Posterior circulation: No signal in the right V4. The left vertebral
artery is patent to the vertebrobasilar junction without significant
stenosis. The left PICA is patent. A right AICA is noted. The right
PICA is not visualized. Vertebral arteries patent to the
vertebrobasilar junction without stenosis. Posterior inferior
cerebral arteries patent bilaterally.

Basilar patent to its distal aspect. Superior cerebellar arteries
patent bilaterally.

Patent P1 segments. PCAs perfused to their distal aspects without
stenosis. The right posterior communicating artery is patent. The
left posterior communicating artery is not visualized.

Anatomic variants: None significant

MRA NECK FINDINGS

Evaluation is somewhat limited by motion artifact.

Right carotid system: No evidence of dissection, occlusion, or
hemodynamically significant stenosis (greater than 50%).

Left carotid system: No evidence of dissection, occlusion, or
hemodynamically significant stenosis (greater than 50%).

Vertebral arteries: The imaged portions of the left vertebral artery
are patent, without significant stenosis, dissection, or occlusion.
No signal in the right vertebral artery throughout the imaged neck.

Other: None
IMPRESSION: 1. Small acute infarct in the posterior left medial pons.
2. No signal is seen in the right vertebral artery throughout the
imaged portions of the neck, to the vertebrobasilar junction. No
additional hemodynamically significant stenosis in the neck.
3. No intracranial large vessel occlusion. Moderate narrowing in the
right cavernous and supraclinoid ICA and mild narrowing in the left
supraclinoid ICA. Possible moderate narrowing in a right M2 branch,
although this may be artifactual.

These results were called by telephone at the time of interpretation
on 04/06/2022 at [DATE] to provider UNICE , who verbally
acknowledged these results.

## 2023-12-07 ENCOUNTER — Encounter: Payer: Self-pay | Admitting: Internal Medicine

## 2023-12-07 ENCOUNTER — Ambulatory Visit: Payer: Medicare PPO | Attending: Internal Medicine | Admitting: Internal Medicine

## 2023-12-07 VITALS — BP 120/58 | HR 48 | Ht 71.0 in | Wt 175.4 lb

## 2023-12-07 DIAGNOSIS — I351 Nonrheumatic aortic (valve) insufficiency: Secondary | ICD-10-CM

## 2023-12-07 DIAGNOSIS — R001 Bradycardia, unspecified: Secondary | ICD-10-CM

## 2023-12-07 DIAGNOSIS — I10 Essential (primary) hypertension: Secondary | ICD-10-CM | POA: Diagnosis not present

## 2023-12-07 MED ORDER — DILTIAZEM HCL ER COATED BEADS 240 MG PO CP24: 240.0000 mg | ORAL_CAPSULE | Freq: Every day | ORAL | 2 refills | Status: DC

## 2023-12-07 NOTE — Patient Instructions (Addendum)
 1 yearMedication Instructions:  Your physician has recommended you make the following change in your medication:  Change diltiazem  240 mg to daily Continue all other medications as prescribed  Labwork: none  Testing/Procedures: Your physician has requested that you have an echocardiogram. Echocardiography is a painless test that uses sound waves to create images of your heart. It provides your doctor with information about the size and shape of your heart and how well your heart's chambers and valves are working. This procedure takes approximately one hour. There are no restrictions for this procedure. Please do NOT wear cologne, perfume, aftershave, or lotions (deodorant is allowed). Please arrive 15 minutes prior to your appointment time.  Please note: We ask at that you not bring children with you during ultrasound (echo/ vascular) testing. Due to room size and safety concerns, children are not allowed in the ultrasound rooms during exams. Our front office staff cannot provide observation of children in our lobby area while testing is being conducted. An adult accompanying a patient to their appointment will only be allowed in the ultrasound room at the discretion of the ultrasound technician under special circumstances. We apologize for any inconvenience.  Follow-Up: Your physician recommends that you schedule a follow-up appointment in: 1 year. You will receive a reminder call in about 10 months reminding you to schedule your appointment. If you don't receive this call, please contact our office.  Any Other Special Instructions Will Be Listed Below (If Applicable).  If you need a refill on your cardiac medications before your next appointment, please call your pharmacy.

## 2023-12-07 NOTE — Progress Notes (Signed)
 Cardiology Office Note  Date: 12/07/2023   ID: Omar Parker, DOB 11/02/1945, MRN [id number]  PCP:  Bertell Satterfield, MD  Cardiologist:  Shulamit Donofrio P Reymundo Winship, MD Electrophysiologist:  None   History of Present Illness: Omar Parker is a 78 y.o. male known to have HTN was referred to cardiology clinic for evaluation of cardiac murmur.  He has no symptoms, overall doing great.  His HR today is 48 bpm.  Currently taking carvedilol  12.5 mg twice daily and diltiazem  240 mg twice daily.  EKG showed sinus bradycardia, first-degree AV block.  Denies having any angina, SOB, dizziness, presyncope, syncope and leg swelling.  Echocardiogram was performed in 2023 that showed normal LVEF and mild aortic regurgitation.  Past Medical History:  Diagnosis Date   Anemia    IDA   Diabetes mellitus without complication (HCC)    GERD (gastroesophageal reflux disease)    High cholesterol    Hypertension    Stage 4 chronic kidney disease (HCC)     Past Surgical History:  Procedure Laterality Date   AV FISTULA PLACEMENT Left 06/28/2020   Procedure: LEFT UPPER ARM ARTERIOVENOUS (AV) GORE-TEX GRAFT;  Surgeon: Harvey Carlin BRAVO, MD;  Location: [facility] OR;  Service: Vascular;  Laterality: Left;   COLONOSCOPY N/A 05/16/2017   Procedure: COLONOSCOPY;  Surgeon: Golda Claudis PENNER, MD;  [facility];  Service: Endoscopy;  Laterality: N/A;  7:30   COLONOSCOPY N/A 04/10/2020   Procedure: COLONOSCOPY;  Surgeon: Shaaron Lamar HERO, MD;  [facility];  Service: Endoscopy;  Laterality: N/A;   ESOPHAGOGASTRODUODENOSCOPY N/A 04/10/2020   Procedure: ESOPHAGOGASTRODUODENOSCOPY (EGD);  Surgeon: Shaaron Lamar HERO, MD;  [facility];  Service: Endoscopy;  Laterality: N/A;   HERNIA REPAIR     INSERTION OF DIALYSIS CATHETER  06/28/2020   Procedure: INSERTION OF DIALYSIS CATHETER;  Surgeon: Harvey Carlin BRAVO, MD;  Location: [facility] OR;  Service: Vascular;;   IR REMOVAL TUN CV CATH W/O FL  09/21/2020   POLYPECTOMY   04/10/2020   Procedure: POLYPECTOMY;  Surgeon: Shaaron Lamar HERO, MD;  [facility];  Service: Endoscopy;;  cecal;    Current Outpatient Medications  Medication Sig Dispense Refill   acetaminophen  (TYLENOL ) 325 MG tablet Take 650 mg by mouth every 6 (six) hours as needed for moderate pain.      aspirin  EC 81 MG tablet Take 1 tablet (81 mg total) by mouth daily. Swallow whole. 30 tablet 12   carvedilol  (COREG ) 12.5 MG tablet Take 1 tablet (12.5 mg total) by mouth 2 (two) times daily with a meal. 60 tablet 3   fluticasone (FLONASE) 50 MCG/ACT nasal spray Place into both nostrils.     hydrALAZINE  (APRESOLINE ) 100 MG tablet Take 1 tablet (100 mg total) by mouth 3 (three) times daily. 90 tablet 2   insulin  glargine (LANTUS ) 100 UNIT/ML injection Inject 0.06 mLs (6 Units total) into the skin at bedtime. (Patient taking differently: Inject 50 Units into the skin at bedtime.)     pantoprazole  (PROTONIX ) 40 MG tablet Take 40 mg by mouth daily.      pravastatin  (PRAVACHOL ) 80 MG tablet Take 40 mg by mouth daily.     diltiazem  (CARDIZEM  CD) 240 MG 24 hr capsule Take 1 capsule (240 mg total) by mouth daily. 90 capsule 2   No current facility-administered medications for this visit.   Allergies:  Ace inhibitors and Atorvastatin    Social History: The patient  reports that he has never smoked. He  has never used smokeless tobacco. He reports that he does not currently use alcohol. He reports that he does not currently use drugs after having used the following drugs: Marijuana.   Family History: The patient's family history includes Cancer in his mother and sister.   ROS:  Please see the history of present illness. Otherwise, complete review of systems is positive for none  All other systems are reviewed and negative.   Physical Exam: VS:  BP (!) 120/58   Pulse (!) 48   Ht 5' 11 (1.803 m)   Wt 175 lb 6.4 oz (79.6 kg)   SpO2 97%   BMI 24.46 kg/m , BMI Body mass index is 24.46 kg/m.  Wt  Readings from Last 3 Encounters:  12/07/23 175 lb 6.4 oz (79.6 kg)  01/26/23 176 lb 8 oz (80.1 kg)  01/01/23 177 lb 14.4 oz (80.7 kg)    General: Patient appears comfortable at rest. HEENT: Conjunctiva and lids normal, oropharynx clear with moist mucosa. Neck: Supple, no elevated JVP or carotid bruits, no thyromegaly. Lungs: Clear to auscultation, nonlabored breathing at rest. Cardiac: Regular rate and rhythm, no S3, systolic murmur is present. Abdomen: Soft, nontender, no hepatomegaly, bowel sounds present, no guarding or rebound. Extremities: No pitting edema, distal pulses 2+. Skin: Warm and dry. Musculoskeletal: No kyphosis. Neuropsychiatric: Alert and oriented x3, affect grossly appropriate.  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  Echocardiogram in 2023 showed normal LVEF, mild AR.  Will repeat echocardiogram for surveillance of aortic regurgitation.  Severe sinus bradycardia, asymptomatic: EKG showed severe sinus bradycardia, HR 48 bpm with first degree AV block.  Asymptomatic.  Currently on carvedilol  12.5 mg twice daily and diltiazem  240 mg twice daily.  Will switch diltiazem  240 mg BID to once daily.  HTN, controlled: Continue current antihypertensives, carvedilol  12.5 mg twice daily, hydralazine  100 mg 3 times daily and diltiazem  240 mg once daily (switched from twice daily).       Medication Adjustments/Labs and Tests Ordered: Current medicines are reviewed at length with the patient today.  Concerns  regarding medicines are outlined above.    Disposition:  Follow up  1 year  Signed Son Barkan Priya Regine Christian, MD, 12/07/2023 10:50 AM    [facility] [address] [place], [place], [place] [address]

## 2023-12-13 DIAGNOSIS — R001 Bradycardia, unspecified: Secondary | ICD-10-CM | POA: Insufficient documentation

## 2023-12-13 DIAGNOSIS — I351 Nonrheumatic aortic (valve) insufficiency: Secondary | ICD-10-CM | POA: Insufficient documentation

## 2023-12-18 ENCOUNTER — Ambulatory Visit: Payer: Medicare PPO | Attending: Internal Medicine

## 2023-12-18 DIAGNOSIS — I351 Nonrheumatic aortic (valve) insufficiency: Secondary | ICD-10-CM

## 2023-12-18 DIAGNOSIS — I10 Essential (primary) hypertension: Secondary | ICD-10-CM | POA: Diagnosis not present

## 2023-12-18 LAB — ECHOCARDIOGRAM COMPLETE
AR max vel: 2.3 cm2
AV Area VTI: 2.31 cm2
AV Area mean vel: 2.31 cm2
AV Mean grad: 7 mm[Hg]
AV Peak grad: 13.8 mm[Hg]
AV Vena cont: 0.6 cm
Ao pk vel: 1.86 m/s
Area-P 1/2: 4.08 cm2
Calc EF: 62.7 %
MV VTI: 2.24 cm2
P 1/2 time: 674 ms
S' Lateral: 3.5 cm
Single Plane A2C EF: 64.9 %
Single Plane A4C EF: 63.8 %

## 2024-05-09 DIAGNOSIS — Z0001 Encounter for general adult medical examination with abnormal findings: Secondary | ICD-10-CM | POA: Diagnosis not present

## 2024-05-09 DIAGNOSIS — I10 Essential (primary) hypertension: Secondary | ICD-10-CM | POA: Diagnosis not present

## 2024-05-09 DIAGNOSIS — Z6823 Body mass index (BMI) 23.0-23.9, adult: Secondary | ICD-10-CM | POA: Diagnosis not present

## 2024-05-09 DIAGNOSIS — E1129 Type 2 diabetes mellitus with other diabetic kidney complication: Secondary | ICD-10-CM | POA: Diagnosis not present

## 2024-05-09 DIAGNOSIS — Z1331 Encounter for screening for depression: Secondary | ICD-10-CM | POA: Diagnosis not present

## 2024-05-09 DIAGNOSIS — R011 Cardiac murmur, unspecified: Secondary | ICD-10-CM | POA: Diagnosis not present

## 2024-05-09 DIAGNOSIS — E1165 Type 2 diabetes mellitus with hyperglycemia: Secondary | ICD-10-CM | POA: Diagnosis not present

## 2024-05-09 DIAGNOSIS — M1991 Primary osteoarthritis, unspecified site: Secondary | ICD-10-CM | POA: Diagnosis not present

## 2024-05-09 DIAGNOSIS — Z992 Dependence on renal dialysis: Secondary | ICD-10-CM | POA: Diagnosis not present

## 2024-05-30 ENCOUNTER — Ambulatory Visit (HOSPITAL_COMMUNITY)
Admission: RE | Admit: 2024-05-30 | Discharge: 2024-05-30 | Disposition: A | Discharge status: Home / Self Care | Attending: Vascular Surgery | Admitting: Vascular Surgery

## 2024-05-30 ENCOUNTER — Encounter (HOSPITAL_COMMUNITY)
Admission: RE | Disposition: A | Discharge status: Home / Self Care | Payer: Self-pay | Source: Home / Self Care | Attending: Vascular Surgery

## 2024-05-30 ENCOUNTER — Other Ambulatory Visit: Payer: Self-pay

## 2024-05-30 DIAGNOSIS — I120 Hypertensive chronic kidney disease with stage 5 chronic kidney disease or end stage renal disease: Secondary | ICD-10-CM | POA: Diagnosis not present

## 2024-05-30 DIAGNOSIS — N186 End stage renal disease: Secondary | ICD-10-CM | POA: Diagnosis not present

## 2024-05-30 DIAGNOSIS — T82858A Stenosis of vascular prosthetic devices, implants and grafts, initial encounter: Secondary | ICD-10-CM | POA: Diagnosis not present

## 2024-05-30 DIAGNOSIS — Y832 Surgical operation with anastomosis, bypass or graft as the cause of abnormal reaction of the patient, or of later complication, without mention of misadventure at the time of the procedure: Secondary | ICD-10-CM | POA: Diagnosis not present

## 2024-05-30 DIAGNOSIS — T82590A Other mechanical complication of surgically created arteriovenous fistula, initial encounter: Secondary | ICD-10-CM | POA: Diagnosis present

## 2024-05-30 DIAGNOSIS — Z992 Dependence on renal dialysis: Secondary | ICD-10-CM | POA: Diagnosis not present

## 2024-05-30 HISTORY — PX: A/V SHUNT INTERVENTION: CATH118220

## 2024-05-30 LAB — GLUCOSE, CAPILLARY: Glucose-Capillary: 131 mg/dL — ABNORMAL HIGH (ref 70–99)

## 2024-05-30 SURGERY — A/V SHUNT INTERVENTION
Anesthesia: LOCAL | Site: Arm Upper | Laterality: Left

## 2024-05-30 MED ORDER — HEPARIN (PORCINE) IN NACL 1000-0.9 UT/500ML-% IV SOLN
INTRAVENOUS | Status: DC | PRN
  Administered 2024-05-30: 500 mL

## 2024-05-30 MED ORDER — LIDOCAINE HCL (PF) 1 % IJ SOLN
INTRAMUSCULAR | Status: DC | PRN
  Administered 2024-05-30: 10 mL via INTRADERMAL

## 2024-05-30 MED ORDER — LIDOCAINE HCL (PF) 1 % IJ SOLN
INTRAMUSCULAR | Status: AC
  Filled 2024-05-30: qty 30

## 2024-05-30 MED ORDER — IODIXANOL 320 MG/ML IV SOLN
INTRAVENOUS | Status: DC | PRN
  Administered 2024-05-30: 65 mL

## 2024-05-30 SURGICAL SUPPLY — 11 items
BALLOON MUSTANG 6X80X75 (BALLOONS) IMPLANT
BALLOON MUSTANG 7X80X75 (BALLOONS) IMPLANT
CATH ANGIO 5F BER 100CM (CATHETERS) IMPLANT
CATH ANGIO 5F BER2 65CM (CATHETERS) IMPLANT
GUIDEWIRE ANGLED .035 180CM (WIRE) IMPLANT
KIT ENCORE 26 ADVANTAGE (KITS) IMPLANT
KIT MICROPUNCTURE NIT STIFF (SHEATH) IMPLANT
KIT PV (KITS) ×1 IMPLANT
SHEATH PINNACLE R/O II 6F 4CM (SHEATH) IMPLANT
TRAY PV CATH (CUSTOM PROCEDURE TRAY) ×1 IMPLANT
TUBING CIL FLEX 10 FLL-RA (TUBING) IMPLANT

## 2024-05-30 NOTE — H&P (Signed)
 Hospital Consult     MRN #:  [id number]  History of Present Illness: This is a 78 y.o. male with end-stage renal disease the presents for left arm fistulogram.  Patient has a left arm AV graft placed by Dr. Harvey in 2021.  States he was told it was not running good with a wheezing noise.  Past Medical History:  Diagnosis Date   Anemia    IDA   Diabetes mellitus without complication (HCC)    GERD (gastroesophageal reflux disease)    High cholesterol    Hypertension    Stage 4 chronic kidney disease (HCC)     Past Surgical History:  Procedure Laterality Date   AV FISTULA PLACEMENT Left 06/28/2020   Procedure: LEFT UPPER ARM ARTERIOVENOUS (AV) GORE-TEX GRAFT;  Surgeon: Harvey Carlin BRAVO, MD;  Location: [facility] OR;  Service: Vascular;  Laterality: Left;   COLONOSCOPY N/A 05/16/2017   Procedure: COLONOSCOPY;  Surgeon: Golda Claudis PENNER, MD;  [facility];  Service: Endoscopy;  Laterality: N/A;  7:30   COLONOSCOPY N/A 04/10/2020   Procedure: COLONOSCOPY;  Surgeon: Shaaron Lamar HERO, MD;  [facility];  Service: Endoscopy;  Laterality: N/A;   ESOPHAGOGASTRODUODENOSCOPY N/A 04/10/2020   Procedure: ESOPHAGOGASTRODUODENOSCOPY (EGD);  Surgeon: Shaaron Lamar HERO, MD;  [facility];  Service: Endoscopy;  Laterality: N/A;   HERNIA REPAIR     INSERTION OF DIALYSIS CATHETER  06/28/2020   Procedure: INSERTION OF DIALYSIS CATHETER;  Surgeon: Harvey Carlin BRAVO, MD;  Location: [facility] OR;  Service: Vascular;;   IR REMOVAL TUN CV CATH W/O FL  09/21/2020   POLYPECTOMY  04/10/2020   Procedure: POLYPECTOMY;  Surgeon: Shaaron Lamar HERO, MD;  [facility];  Service: Endoscopy;;  cecal;    Allergies  Allergen Reactions   Ace Inhibitors Swelling   Atorvastatin      Sweating and pt reports interferes w/diabetes &bp    Prior to Admission medications   Medication Sig Start Date End Date Taking? Authorizing Provider  acetaminophen  (TYLENOL ) 325 MG tablet Take 650 mg by mouth  every 6 (six) hours as needed for moderate pain.     [provider]  aspirin  EC 81 MG tablet Take 1 tablet (81 mg total) by mouth daily. Swallow whole. 04/08/22   Johnson, Clanford L, MD  carvedilol  (COREG ) 12.5 MG tablet Take 1 tablet (12.5 mg total) by mouth 2 (two) times daily with a meal. 01/02/23 12/07/23  Ricky Fines, MD  diltiazem  (CARDIZEM  CD) 240 MG 24 hr capsule Take 1 capsule (240 mg total) by mouth daily. 12/07/23   Mallipeddi, Vishnu P, MD  fluticasone (FLONASE) 50 MCG/ACT nasal spray Place into both nostrils. 12/04/23   [provider]  hydrALAZINE  (APRESOLINE ) 100 MG tablet Take 1 tablet (100 mg total) by mouth 3 (three) times daily. 01/02/23 12/07/23  Ricky Fines, MD  insulin  glargine (LANTUS ) 100 UNIT/ML injection Inject 0.06 mLs (6 Units total) into the skin at bedtime. Patient taking differently: Inject 50 Units into the skin at bedtime. 01/02/23   Ricky Fines, MD  pantoprazole  (PROTONIX ) 40 MG tablet Take 40 mg by mouth daily.     [provider]  pravastatin  (PRAVACHOL ) 80 MG tablet Take 40 mg by mouth daily. 11/06/22   [provider]    Social History   Socioeconomic History   Marital status: Married    Spouse name: Not on file   Number of children: 2   Years of education: Not on file   Highest  education level: 12th grade  Occupational History   Occupation: distribution work  Tobacco Use   Smoking status: Never   Smokeless tobacco: Never  Vaping Use   Vaping status: Never Used  Substance and Sexual Activity   Alcohol use: Not Currently    Comment: no h/o heavy use   Drug use: Not Currently    Types: Marijuana    Comment: none in 20 years   Sexual activity: Yes  Other Topics Concern   Not on file  Social History Narrative   Not on file   Social Drivers of Health   Financial Resource Strain: Low Risk  (01/12/2023)   Overall Financial Resource Strain (CARDIA)    Difficulty of Paying Living Expenses: Not very hard  Food  Insecurity: No Food Insecurity (01/12/2023)   Hunger Vital Sign    Worried About Running Out of Food in the Last Year: Never true    Ran Out of Food in the Last Year: Never true  Transportation Needs: No Transportation Needs (01/12/2023)   PRAPARE - Administrator, Civil Service (Medical): No    Lack of Transportation (Non-Medical): No  Physical Activity: Not on file  Stress: Not on file  Social Connections: Not on file  Intimate Partner Violence: Not At Risk (01/01/2023)   Humiliation, Afraid, Rape, and Kick questionnaire    Fear of Current or Ex-Partner: No    Emotionally Abused: No    Physically Abused: No    Sexually Abused: No     Family History  Problem Relation Age of Onset   Cancer Mother    Cancer Sister    Chronic Renal Failure Neg Hx     ROS: [x]  Positive   [ ]  Negative   [ ]  All sytems reviewed and are negative  Cardiovascular: []  chest pain/pressure []  palpitations []  SOB lying flat []  DOE []  pain in legs while walking []  pain in legs at rest []  pain in legs at night []  non-healing ulcers []  hx of DVT []  swelling in legs  Pulmonary: []  productive cough []  asthma/wheezing []  home O2  Neurologic: []  weakness in []  arms []  legs []  numbness in []  arms []  legs []  hx of CVA []  mini stroke [] difficulty speaking or slurred speech []  temporary loss of vision in one eye []  dizziness  Hematologic: []  hx of cancer []  bleeding problems []  problems with blood clotting easily  Endocrine:   []  diabetes []  thyroid  disease  GI []  vomiting blood []  blood in stool  GU: []  CKD/renal failure []  HD--[]  M/W/F or []  T/T/S []  burning with urination []  blood in urine  Psychiatric: []  anxiety []  depression  Musculoskeletal: []  arthritis []  joint pain  Integumentary: []  rashes []  ulcers  Constitutional: []  fever []  chills   Physical Examination  Vitals:   05/30/24 0953 05/30/24 1000  BP: (!) 179/63 (!) 172/62  Pulse: (!) 55 (!) 54   Resp: 12 14  Temp: 98.2 F (36.8 C)   SpO2: 96% 97%   There is no height or weight on file to calculate BMI.  General:  NAD Gait: Not observed HENT: WNL, normocephalic Pulmonary: normal non-labored breathing Cardiac: regular, without  Murmurs, rubs or gallops Abdomen:  soft, NT/ND, no masses Vascular Exam/Pulses: Left upper arm AVG pulsatile Musculoskeletal: no muscle wasting or atrophy  Neurologic: A&O X 3; Appropriate Affect ; SENSATION: normal; MOTOR FUNCTION:  moving all extremities equally. Speech is fluent/normal   CBC    Component Value Date/Time  WBC 7.8 01/01/2023 0425   RBC 3.68 (L) 01/01/2023 0425   HGB 9.8 (L) 01/01/2023 0425   HCT 29.9 (L) 01/01/2023 0425   PLT 244 01/01/2023 0425   MCV 81.3 01/01/2023 0425   MCH 26.6 01/01/2023 0425   MCHC 32.8 01/01/2023 0425   RDW 15.9 (H) 01/01/2023 0425   LYMPHSABS 1.5 01/01/2023 0425   MONOABS 0.8 01/01/2023 0425   EOSABS 0.2 01/01/2023 0425   BASOSABS 0.1 01/01/2023 0425    BMET    Component Value Date/Time   NA 133 (L) 01/01/2023 0425   K 3.6 01/01/2023 0425   CL 98 01/01/2023 0425   CO2 23 01/01/2023 0425   GLUCOSE 307 (H) 01/01/2023 0425   BUN 32 (H) 01/01/2023 0425   CREATININE 5.54 (H) 01/01/2023 0425   CALCIUM  6.8 (L) 01/01/2023 0425   GFRNONAA 10 (L) 01/01/2023 0425   GFRAA 11 (L) 04/10/2020 0637    COAGS: Lab Results  Component Value Date   INR 1.1 04/09/2020   INR 0.93 03/08/2017     Non-Invasive Vascular Imaging:    N/a   ASSESSMENT/PLAN: This is a 78 y.o. male 78 y.o. male with end-stage renal disease the presents for left arm fistulogram.  Patient has a left arm AV graft placed by Dr. Harvey in 2021.    Discussed plan for left arm fistulogram with possible invention including angioplasty and or stent and all questions answered.  Lonni DOROTHA Gaskins, MD [facility] Office: [phone]  Lonni JINNY Gaskins

## 2024-05-30 NOTE — Op Note (Signed)
    Patient name: Omar Parker MRN: [id number] DOB: [birth year] Sex: male  05/30/2024 Pre-operative Diagnosis: Malfunctioning left arm AV graft Post-operative diagnosis:  Same Surgeon:  Lonni DOROTHA Gaskins, MD Procedure Performed: 1.  Ultrasound-guided access left arm AV graft 2.  Left arm fistulogram including central venogram 3.  Left peripheral angioplasty AV graft including the venous anastomosis with a 6 mm Mustang and the mid graft with a 7 mm Mustang  Indications: Patient is a 78 year old male with end-stage renal disease using a left arm AV graft.  He presents for fistulogram due to malfunction.  Findings:   Ultrasound-guided access left arm AV graft.  It appeared that there was a high-grade stenosis at the venous anastomosis >95% which was sewn to the smaller brachial vein.  There was reflux into a larger brachial vein but this did not communicate with the graft.  I treated the venous anastomosis with a 6 mm Mustang with excellent results.  I also treated the midportion of the graft for a 60% stenosis with 7 mm Mustang.  Widely patent at completion with excellent thrill.   Procedure:  The patient was identified in the holding area and taken to [facility] lab.  Placed on the table in supine position.  Left arm was prepped and draped in standard sterile fashion.  Timeout was performed.  Ultrasound was used to evaluate the left arm graft and this was accessed with micro access needle and placed microwire and micro sheath.  Left upper extremity fistulogram was then obtained.  Findings noted above.  I then used a  Glidewire to exchange for a short 6 [place] sheath.  Initially had some trouble evaluating the outflow of the graft.  After evaluating in his multiple angles it appeared that the venous anastomosis was sewn to the smaller brachial vein in the upper arm.  I then was able to finally cross the venous anastomosis and get my wire centrally.  There was a high grade stenosis here >95%.  This was  treated with a 6 mm Mustang balloon with nominal pressure for 2 minutes.  Excellent results.  Much better thrill.  I then treated the mid upper portion of the graft with a 7 mm Mustang.  Good results.  No evidence of stenosis at the arterial anastomosis.  Wires catheters removed.  Sheath was removed after a pursestring was placed.    Lonni DOROTHA Gaskins, MD [facility] [place] Office: [phone]

## 2024-06-02 ENCOUNTER — Encounter (HOSPITAL_COMMUNITY): Payer: Self-pay | Admitting: Vascular Surgery

## 2024-07-22 ENCOUNTER — Ambulatory Visit: Attending: Nurse Practitioner | Admitting: Nurse Practitioner

## 2024-08-07 ENCOUNTER — Telehealth: Payer: Self-pay | Admitting: Internal Medicine

## 2024-08-07 NOTE — Telephone Encounter (Signed)
 Notes routed to [facility]

## 2024-08-07 NOTE — Telephone Encounter (Signed)
 Betsy with  Dialysis says their provider is requesting a copy of last office visit notes.  Fax#: [phone]  Phone#: [phone]

## 2024-10-15 ENCOUNTER — Other Ambulatory Visit: Payer: Self-pay

## 2024-10-15 ENCOUNTER — Inpatient Hospital Stay (HOSPITAL_COMMUNITY)
Admission: EM | Admit: 2024-10-15 | Discharge: 2024-10-17 | DRG: 377 | Discharge status: Against Medical Advice | Attending: Family Medicine | Admitting: Family Medicine

## 2024-10-15 ENCOUNTER — Encounter (HOSPITAL_COMMUNITY): Payer: Self-pay

## 2024-10-15 ENCOUNTER — Encounter (HOSPITAL_COMMUNITY): Payer: Self-pay | Admitting: *Deleted

## 2024-10-15 ENCOUNTER — Emergency Department (HOSPITAL_COMMUNITY)
Admission: EM | Admit: 2024-10-15 | Discharge: 2024-10-15 | Disposition: A | Discharge status: Home / Self Care | Attending: Emergency Medicine | Admitting: Emergency Medicine

## 2024-10-15 DIAGNOSIS — D123 Benign neoplasm of transverse colon: Secondary | ICD-10-CM | POA: Diagnosis present

## 2024-10-15 DIAGNOSIS — Z5329 Procedure and treatment not carried out because of patient's decision for other reasons: Secondary | ICD-10-CM | POA: Insufficient documentation

## 2024-10-15 DIAGNOSIS — E7800 Pure hypercholesterolemia, unspecified: Secondary | ICD-10-CM | POA: Diagnosis not present

## 2024-10-15 DIAGNOSIS — K648 Other hemorrhoids: Secondary | ICD-10-CM | POA: Diagnosis present

## 2024-10-15 DIAGNOSIS — K625 Hemorrhage of anus and rectum: Principal | ICD-10-CM | POA: Diagnosis present

## 2024-10-15 DIAGNOSIS — D631 Anemia in chronic kidney disease: Secondary | ICD-10-CM | POA: Diagnosis present

## 2024-10-15 DIAGNOSIS — K3189 Other diseases of stomach and duodenum: Secondary | ICD-10-CM | POA: Diagnosis present

## 2024-10-15 DIAGNOSIS — D62 Acute posthemorrhagic anemia: Secondary | ICD-10-CM | POA: Diagnosis present

## 2024-10-15 DIAGNOSIS — N186 End stage renal disease: Secondary | ICD-10-CM | POA: Insufficient documentation

## 2024-10-15 DIAGNOSIS — Z7982 Long term (current) use of aspirin: Secondary | ICD-10-CM

## 2024-10-15 DIAGNOSIS — R001 Bradycardia, unspecified: Secondary | ICD-10-CM | POA: Diagnosis present

## 2024-10-15 DIAGNOSIS — Z794 Long term (current) use of insulin: Secondary | ICD-10-CM | POA: Insufficient documentation

## 2024-10-15 DIAGNOSIS — Z992 Dependence on renal dialysis: Secondary | ICD-10-CM | POA: Diagnosis not present

## 2024-10-15 DIAGNOSIS — N2581 Secondary hyperparathyroidism of renal origin: Secondary | ICD-10-CM | POA: Diagnosis present

## 2024-10-15 DIAGNOSIS — E1129 Type 2 diabetes mellitus with other diabetic kidney complication: Secondary | ICD-10-CM | POA: Diagnosis present

## 2024-10-15 DIAGNOSIS — N185 Chronic kidney disease, stage 5: Secondary | ICD-10-CM | POA: Diagnosis not present

## 2024-10-15 DIAGNOSIS — E1122 Type 2 diabetes mellitus with diabetic chronic kidney disease: Secondary | ICD-10-CM | POA: Diagnosis present

## 2024-10-15 DIAGNOSIS — Z8673 Personal history of transient ischemic attack (TIA), and cerebral infarction without residual deficits: Secondary | ICD-10-CM

## 2024-10-15 DIAGNOSIS — I120 Hypertensive chronic kidney disease with stage 5 chronic kidney disease or end stage renal disease: Secondary | ICD-10-CM | POA: Diagnosis present

## 2024-10-15 DIAGNOSIS — K922 Gastrointestinal hemorrhage, unspecified: Secondary | ICD-10-CM

## 2024-10-15 DIAGNOSIS — K2970 Gastritis, unspecified, without bleeding: Secondary | ICD-10-CM | POA: Diagnosis present

## 2024-10-15 DIAGNOSIS — D122 Benign neoplasm of ascending colon: Secondary | ICD-10-CM | POA: Diagnosis present

## 2024-10-15 DIAGNOSIS — K449 Diaphragmatic hernia without obstruction or gangrene: Secondary | ICD-10-CM | POA: Diagnosis present

## 2024-10-15 DIAGNOSIS — I10 Essential (primary) hypertension: Secondary | ICD-10-CM | POA: Diagnosis present

## 2024-10-15 DIAGNOSIS — K219 Gastro-esophageal reflux disease without esophagitis: Secondary | ICD-10-CM | POA: Diagnosis not present

## 2024-10-15 DIAGNOSIS — K921 Melena: Secondary | ICD-10-CM | POA: Diagnosis not present

## 2024-10-15 DIAGNOSIS — Z79899 Other long term (current) drug therapy: Secondary | ICD-10-CM

## 2024-10-15 DIAGNOSIS — Z888 Allergy status to other drugs, medicaments and biological substances status: Secondary | ICD-10-CM

## 2024-10-15 DIAGNOSIS — K5791 Diverticulosis of intestine, part unspecified, without perforation or abscess with bleeding: Principal | ICD-10-CM | POA: Diagnosis present

## 2024-10-15 DIAGNOSIS — I351 Nonrheumatic aortic (valve) insufficiency: Secondary | ICD-10-CM | POA: Diagnosis present

## 2024-10-15 LAB — COMPREHENSIVE METABOLIC PANEL WITH GFR
ALT: <5 U/L (ref 0–44)
AST: 15 U/L (ref 15–41)
Albumin: 4.1 g/dL (ref 3.5–5.0)
Alkaline Phosphatase: 40 U/L (ref 38–126)
Anion gap: 10 (ref 5–15)
BUN: 36 mg/dL — ABNORMAL HIGH (ref 8–23)
CO2: 32 mmol/L (ref 22–32)
Calcium: 7.8 mg/dL — ABNORMAL LOW (ref 8.9–10.3)
Chloride: 98 mmol/L (ref 98–111)
Creatinine, Ser: 6.81 mg/dL — ABNORMAL HIGH (ref 0.61–1.24)
GFR, Estimated: 8 mL/min — ABNORMAL LOW (ref >60)
Glucose, Bld: 145 mg/dL — ABNORMAL HIGH (ref 70–99)
Potassium: 3.6 mmol/L (ref 3.5–5.1)
Sodium: 140 mmol/L (ref 135–145)
Total Bilirubin: 0.3 mg/dL (ref 0.0–1.2)
Total Protein: 7.8 g/dL (ref 6.5–8.1)

## 2024-10-15 LAB — GLUCOSE, CAPILLARY
Glucose-Capillary: 120 mg/dL — ABNORMAL HIGH (ref 70–99)
Glucose-Capillary: 123 mg/dL — ABNORMAL HIGH (ref 70–99)

## 2024-10-15 LAB — RETICULOCYTES
Immature Retic Fract: 9.8 % (ref 2.3–15.9)
RBC.: 3.65 MIL/uL — ABNORMAL LOW (ref 4.22–5.81)
Retic Count, Absolute: 46.2 K/uL (ref 19.0–186.0)
Retic Ct Pct: 1.3 % (ref 0.4–3.1)

## 2024-10-15 LAB — PROTIME-INR
INR: 1.1 (ref 0.8–1.2)
Prothrombin Time: 14.4 s (ref 11.4–15.2)

## 2024-10-15 LAB — CBC
HCT: 30.1 % — ABNORMAL LOW (ref 39.0–52.0)
HCT: 26.5 % — ABNORMAL LOW (ref 39.0–52.0)
Hemoglobin: 9.5 g/dL — ABNORMAL LOW (ref 13.0–17.0)
Hemoglobin: 8.4 g/dL — ABNORMAL LOW (ref 13.0–17.0)
MCH: 26.2 pg (ref 26.0–34.0)
MCH: 26.4 pg (ref 26.0–34.0)
MCHC: 31.6 g/dL (ref 30.0–36.0)
MCHC: 31.7 g/dL (ref 30.0–36.0)
MCV: 82.9 fL (ref 80.0–100.0)
MCV: 83.3 fL (ref 80.0–100.0)
Platelets: 231 K/uL (ref 150–400)
Platelets: 219 K/uL (ref 150–400)
RBC: 3.63 MIL/uL — ABNORMAL LOW (ref 4.22–5.81)
RBC: 3.18 MIL/uL — ABNORMAL LOW (ref 4.22–5.81)
RDW: 16.2 % — ABNORMAL HIGH (ref 11.5–15.5)
RDW: 16.3 % — ABNORMAL HIGH (ref 11.5–15.5)
WBC: 6.6 K/uL (ref 4.0–10.5)
WBC: 8.5 K/uL (ref 4.0–10.5)
nRBC: 0 % (ref 0.0–0.2)
nRBC: 0 % (ref 0.0–0.2)

## 2024-10-15 LAB — IRON AND TIBC
Iron: 60 ug/dL (ref 45–182)
Saturation Ratios: 30 % (ref 17.9–39.5)
TIBC: 200 ug/dL — ABNORMAL LOW (ref 250–450)
UIBC: 140 ug/dL

## 2024-10-15 LAB — CBG MONITORING, ED
Glucose-Capillary: 156 mg/dL — ABNORMAL HIGH (ref 70–99)
Glucose-Capillary: 127 mg/dL — ABNORMAL HIGH (ref 70–99)

## 2024-10-15 LAB — POC OCCULT BLOOD, ED: Fecal Occult Bld: POSITIVE — AB

## 2024-10-15 LAB — FOLATE: Folate: 6.7 ng/mL (ref >5.9)

## 2024-10-15 LAB — FERRITIN: Ferritin: 1082 ng/mL — ABNORMAL HIGH (ref 24–336)

## 2024-10-15 LAB — HEMOGLOBIN A1C
Hgb A1c MFr Bld: 5.8 % — ABNORMAL HIGH (ref 4.8–5.6)
Mean Plasma Glucose: 119.76 mg/dL

## 2024-10-15 LAB — VITAMIN B12: Vitamin B-12: 438 pg/mL (ref 180–914)

## 2024-10-15 MED ORDER — CARVEDILOL 12.5 MG PO TABS
25.0000 mg | ORAL_TABLET | Freq: Two times a day (BID) | ORAL | Status: DC
  Administered 2024-10-15: 17:00:00 25 mg via ORAL
  Filled 2024-10-15: qty 2

## 2024-10-15 MED ORDER — ONDANSETRON HCL 4 MG/2ML IJ SOLN: 4.0000 mg | Freq: Four times a day (QID) | INTRAMUSCULAR | Status: DC | PRN

## 2024-10-15 MED ORDER — HYDRALAZINE HCL 20 MG/ML IJ SOLN: 10.0000 mg | INTRAMUSCULAR | Status: DC | PRN

## 2024-10-15 MED ORDER — PRAVASTATIN SODIUM 40 MG PO TABS: 40.0000 mg | ORAL_TABLET | Freq: Every day | ORAL | Status: DC

## 2024-10-15 MED ORDER — INSULIN ASPART 100 UNIT/ML IJ SOLN
0.0000 [IU] | INTRAMUSCULAR | Status: DC
  Administered 2024-10-15: 09:00:00 1 [IU] via SUBCUTANEOUS
  Filled 2024-10-15: qty 1

## 2024-10-15 MED ORDER — INSULIN ASPART 100 UNIT/ML IJ SOLN: 0.0000 [IU] | Freq: Three times a day (TID) | INTRAMUSCULAR | Status: DC

## 2024-10-15 MED ORDER — BISACODYL 5 MG PO TBEC: 5.0000 mg | DELAYED_RELEASE_TABLET | Freq: Every day | ORAL | Status: DC | PRN

## 2024-10-15 MED ORDER — CARVEDILOL 12.5 MG PO TABS
12.5000 mg | ORAL_TABLET | Freq: Two times a day (BID) | ORAL | Status: DC
  Administered 2024-10-15: 09:00:00 12.5 mg via ORAL
  Filled 2024-10-15: qty 1

## 2024-10-15 MED ORDER — MINOXIDIL 2.5 MG PO TABS: 2.5000 mg | ORAL_TABLET | Freq: Every day | ORAL | Status: DC

## 2024-10-15 MED ORDER — AMLODIPINE BESYLATE 5 MG PO TABS
10.0000 mg | ORAL_TABLET | Freq: Every day | ORAL | Status: DC
  Administered 2024-10-15 – 2024-10-17 (×3): 10 mg via ORAL
  Filled 2024-10-15 (×3): qty 2

## 2024-10-15 MED ORDER — ACETAMINOPHEN 325 MG PO TABS: 650.0000 mg | ORAL_TABLET | Freq: Four times a day (QID) | ORAL | Status: DC | PRN

## 2024-10-15 MED ORDER — OXYCODONE HCL 5 MG PO TABS: 5.0000 mg | ORAL_TABLET | Freq: Four times a day (QID) | ORAL | Status: DC | PRN

## 2024-10-15 MED ORDER — ACETAMINOPHEN 650 MG RE SUPP: 650.0000 mg | Freq: Four times a day (QID) | RECTAL | Status: DC | PRN

## 2024-10-15 MED ORDER — PEG 3350-KCL-NA BICARB-NACL 420 G PO SOLR
4000.0000 mL | Freq: Once | ORAL | Status: AC
  Administered 2024-10-15: 14:00:00 4000 mL via ORAL
  Filled 2024-10-15: qty 4000

## 2024-10-15 MED ORDER — ONDANSETRON HCL 4 MG PO TABS: 4.0000 mg | ORAL_TABLET | Freq: Four times a day (QID) | ORAL | Status: DC | PRN

## 2024-10-15 MED ORDER — DILTIAZEM HCL ER COATED BEADS 240 MG PO CP24: 240.0000 mg | ORAL_CAPSULE | Freq: Every day | ORAL | Status: DC

## 2024-10-15 MED ORDER — HYDRALAZINE HCL 50 MG PO TABS
100.0000 mg | ORAL_TABLET | Freq: Two times a day (BID) | ORAL | Status: DC
  Administered 2024-10-15 (×2): 100 mg via ORAL
  Filled 2024-10-15 (×2): qty 2

## 2024-10-15 MED ORDER — PANTOPRAZOLE SODIUM 40 MG PO TBEC
40.0000 mg | DELAYED_RELEASE_TABLET | Freq: Every day | ORAL | Status: DC
  Administered 2024-10-15 – 2024-10-17 (×3): 40 mg via ORAL
  Filled 2024-10-15 (×3): qty 1

## 2024-10-15 NOTE — Hospital Course (Addendum)
 78 year old male with ESRD on dialysis TTS, hypertension, history of stroke, chronically on aspirin , hyperlipidemia, GERD, type 2 diabetes mellitus, presented to the ED earlier this morning complaining of bright red blood per rectum and tested Hemoccult positive with hemoglobin down from baseline of about 10-8.4.  He is not on full anticoagulation.  He has some fatigue but no dizziness or weakness or falling.  Admission was initially requested in the ED however patient declined and left AGAINST MEDICAL ADVICE.  He has subsequently returned to the ED after having another bloody bowel movement at home.  He is now agreeable to hospital admission.

## 2024-10-15 NOTE — ED Notes (Signed)
Dr. Abbey Chatters at bedside ?

## 2024-10-15 NOTE — ED Provider Notes (Signed)
 [place] EMERGENCY DEPARTMENT AT [facility] Provider Note   CSN: [id number] Arrival date & time: 10/15/24  0202     Patient presents with: Blood In Stools   Omar Parker is a 78 y.o. male.   The history is provided by the patient.  Patient with history of end-stage renal disease (T/TH/S) presents with bright red blood per rectum.  Patient reports he has had up to 3 episodes of dark red jellylike stool over the past day.  No fevers or vomiting.  No chest or abdominal pain.  No generalized weakness or dizziness.  He is on aspirin  but no other antiplatelet or anticoagulation He has no other acute complaints     Prior to Admission medications  Medication Sig Start Date End Date Taking? Authorizing Provider  acetaminophen  (TYLENOL ) 325 MG tablet Take 650 mg by mouth every 6 (six) hours as needed for moderate pain.     [provider]  aspirin  EC 81 MG tablet Take 1 tablet (81 mg total) by mouth daily. Swallow whole. 04/08/22   Johnson, Clanford L, MD  carvedilol  (COREG ) 12.5 MG tablet Take 1 tablet (12.5 mg total) by mouth 2 (two) times daily with a meal. 01/02/23 12/07/23  Ricky Fines, MD  diltiazem  (CARDIZEM  CD) 240 MG 24 hr capsule Take 1 capsule (240 mg total) by mouth daily. 12/07/23   Mallipeddi, Vishnu P, MD  fluticasone (FLONASE) 50 MCG/ACT nasal spray Place into both nostrils. 12/04/23   [provider]  hydrALAZINE  (APRESOLINE ) 100 MG tablet Take 1 tablet (100 mg total) by mouth 3 (three) times daily. 01/02/23 12/07/23  Ricky Fines, MD  insulin  glargine (LANTUS ) 100 UNIT/ML injection Inject 0.06 mLs (6 Units total) into the skin at bedtime. Patient taking differently: Inject 50 Units into the skin at bedtime. 01/02/23   Ricky Fines, MD  pantoprazole  (PROTONIX ) 40 MG tablet Take 40 mg by mouth daily.     [provider]  pravastatin  (PRAVACHOL ) 80 MG tablet Take 40 mg by mouth daily. 11/06/22   [provider]    Allergies: Ace  inhibitors and Atorvastatin     Review of Systems  Constitutional:  Negative for fever.  Cardiovascular:  Negative for chest pain.  Gastrointestinal:  Positive for blood in stool. Negative for vomiting.  Neurological:  Negative for weakness and light-headedness.    Updated Vital Signs BP (!) 158/66   Pulse 68   Temp 98 F (36.7 C) (Oral)   Resp 16   Ht 1.803 m (5' 11)   Wt 79.6 kg   SpO2 98%   BMI 24.48 kg/m   Physical Exam CONSTITUTIONAL: Well developed/well nourished, no distress HEAD: Normocephalic/atraumatic EYES: EOMI/PERRL ENMT: Mucous membranes moist NECK: supple no meningeal signs CV: S1/S2 noted, no murmurs/rubs/gallops noted LUNGS: Lungs are clear to auscultation bilaterally, no apparent distress ABDOMEN: soft, nontender Rectal-small amount of stool with blood noted.  No melena NEURO: Pt is awake/alert/appropriate, moves all extremitiesx4.  No facial droop.   SKIN: warm, color normal  (all labs ordered are listed, but only abnormal results are displayed) Labs Reviewed  COMPREHENSIVE METABOLIC PANEL WITH GFR - Abnormal; Notable for the following components:      Result Value   Glucose, Bld 145 (*)    BUN 36 (*)    Creatinine, Ser 6.81 (*)    Calcium  7.8 (*)    GFR, Estimated 8 (*)    All other components within normal limits  CBC - Abnormal; Notable for the following components:  RBC 3.63 (*)    Hemoglobin 9.5 (*)    HCT 30.1 (*)    RDW 16.2 (*)    All other components within normal limits  PROTIME-INR  POC OCCULT BLOOD, ED  TYPE AND SCREEN    EKG: None  Radiology: No results found.   Procedures   Medications Ordered in the ED - No data to display  Clinical Course as of 10/15/24 0425  Wed Oct 15, 2024  0424 Patient with history of ESRD presents with bright red blood per rectum at home. He was Hemoccult positive here in the ER.  I had a long discussion with the patient about need for admission.  He is very high risk given his underlying  renal disease, as well as chronic anemia.  He is not currently anticoagulated. [DW]  0425 Patient refuses admission.  I discussed risk of death/disability of leaving against medical advice and the patient accepts these risks.  The patient is awake/alert able to make decisions, and does not appear intoxicated Patient discharged against medical advice.    He was given referral to gastroenterology. Advised he can return at any time [DW]    Clinical Course User Index [DW] Midge Golas, MD                                 Medical Decision Making Amount and/or Complexity of Data Reviewed Labs: ordered.   This patient presents to the ED for concern of rectal bleeding, this involves an extensive number of treatment options, and is a complaint that carries with it a high risk of complications and morbidity.  The differential diagnosis includes but is not limited to royal bleeding, anal fissure, diverticulosis, brisk upper GI bleed  Comorbidities that complicate the patient evaluation: Patients presentation is complicated by their history of ESRD  Additional history obtained: Records reviewed previous admission documents  Lab Tests: I Ordered, and personally interpreted labs.  The pertinent results include: Stable anemia, chronic renal failure, positive Hemoccult  Reevaluation: After the interventions noted above, I reevaluated the patient and found that they have :stayed the same  Complexity of problems addressed: Patients presentation is most consistent with  acute presentation with potential threat to life or bodily function  Disposition: After consideration of the diagnostic results and the patients response to treatment,  I feel that the patent would benefit from discharge AGAINST MEDICAL ADVICE.        Final diagnoses:  Rectal bleeding    ED Discharge Orders     None          Midge Golas, MD 10/15/24 [phone]

## 2024-10-15 NOTE — Consult Note (Signed)
 Gastroenterology Consult   Referring Provider: No ref. provider found Primary Care Physician:  Pcp, No Primary Gastroenterologist:  Previously DR. Rehman   Patient ID: Omar Parker; [id number]; February 27, 1946   Admit date: 10/15/2024  LOS: 0 days   Date of Consultation: 10/15/2024  Reason for Consultation:  rectal bleeding  History of Present Illness   Omar Parker is a 78 y.o. year old male with history of ESRD on dialysis TTS, HTN, history of stroke, chronically on aspirin , HLD, GERD, type 2 diabetes mellitus, who presented to the ED earlier this morning complaining of bright red blood per rectum and tested Hemoccult positive with hemoglobin down from baseline, patient was initially planned for admission then left AMA and returned after another episode of rectal bleeding. GI consulted for further evaluation  ED Course:  VSS Hgb 9.5>>8.4>>  BUN 36( below baseline) Calcium  7.8  FOB positive   Consult: States he had dialysis on Tuesday and felt fine. He came home and had sensation to defecate, when he went to the restroom he noted bright red to darker blood mixed in with the stool. He had a subsequent BM with mostly just BRB. Denies any black color to the stool. No nausea or vomiting. He takes daily baby aspirin . He takes [facility] powder usually atleast once per day. Has been doing this for a while. No constipation or diarrhea. Denies any abdominal pain. No changes in appetite or weight loss. He endorses some fatigue/weakness but no dizziness or sob. History of LGIB in 2021, suspected possibly diverticular, in setting of ongoing NSAID use.   Denies ETOH or tobacco use  Last EGD:03/2020 - Normal esophagus.                           - Small hiatal hernia.                           - Erosive gastropathy with no stigmata of recent                            bleeding -biopsied. Patient likely bled from his                            lower GI tract.  Last Colonoscopy: 03/2020 - Diverticulosis in  the sigmoid colon and in the                            descending colon.                           - Three 4 to 8 mm polyps in the cecum, removed with                            a cold snare. Resected and retrieved. I suppose he                            could have bled from a diverticulum. However, no                            stigmata whatsoever.                           -  The examination was otherwise normal on direct                            and retroflexion views.  Past Medical History:  Diagnosis Date   Anemia    IDA   Diabetes mellitus without complication (HCC)    GERD (gastroesophageal reflux disease)    High cholesterol    Hypertension    Stage 4 chronic kidney disease (HCC)     Past Surgical History:  Procedure Laterality Date   A/V SHUNT INTERVENTION Left 05/30/2024   Procedure: A/V SHUNT INTERVENTION;  Surgeon: Gretta Lonni PARAS, MD;  Location: [facility];  Service: Cardiovascular;  Laterality: Left;   AV FISTULA PLACEMENT Left 06/28/2020   Procedure: LEFT UPPER ARM ARTERIOVENOUS (AV) GORE-TEX GRAFT;  Surgeon: Harvey Carlin BRAVO, MD;  Location: [facility] OR;  Service: Vascular;  Laterality: Left;   COLONOSCOPY N/A 05/16/2017   Procedure: COLONOSCOPY;  Surgeon: Golda Claudis PENNER, MD;  [facility];  Service: Endoscopy;  Laterality: N/A;  7:30   COLONOSCOPY N/A 04/10/2020   Procedure: COLONOSCOPY;  Surgeon: Shaaron Lamar HERO, MD;  [facility];  Service: Endoscopy;  Laterality: N/A;   ESOPHAGOGASTRODUODENOSCOPY N/A 04/10/2020   Procedure: ESOPHAGOGASTRODUODENOSCOPY (EGD);  Surgeon: Shaaron Lamar HERO, MD;  [facility];  Service: Endoscopy;  Laterality: N/A;   HERNIA REPAIR     INSERTION OF DIALYSIS CATHETER  06/28/2020   Procedure: INSERTION OF DIALYSIS CATHETER;  Surgeon: Harvey Carlin BRAVO, MD;  Location: [facility] OR;  Service: Vascular;;   IR REMOVAL TUN CV CATH W/O FL  09/21/2020   POLYPECTOMY  04/10/2020   Procedure: POLYPECTOMY;  Surgeon: Shaaron Lamar HERO, MD;  [facility];  Service: Endoscopy;;  cecal;    Prior to Admission medications  Medication Sig Start Date End Date Taking? Authorizing Provider  acetaminophen  (TYLENOL ) 325 MG tablet Take 650 mg by mouth every 6 (six) hours as needed for moderate pain.     [provider]  aspirin  EC 81 MG tablet Take 1 tablet (81 mg total) by mouth daily. Swallow whole. 04/08/22   Vicci Afton CROME, MD  carvedilol  (COREG ) 12.5 MG tablet Take 1 tablet (12.5 mg total) by mouth 2 (two) times daily with a meal. 01/02/23 12/07/23  Ricky Fines, MD  diltiazem  (CARDIZEM  CD) 240 MG 24 hr capsule Take 1 capsule (240 mg total) by mouth daily. 12/07/23   Mallipeddi, Vishnu P, MD  fluticasone (FLONASE) 50 MCG/ACT nasal spray Place into both nostrils. 12/04/23   [provider]  hydrALAZINE  (APRESOLINE ) 100 MG tablet Take 1 tablet (100 mg total) by mouth 3 (three) times daily. 01/02/23 12/07/23  Ricky Fines, MD  insulin  glargine (LANTUS ) 100 UNIT/ML injection Inject 0.06 mLs (6 Units total) into the skin at bedtime. Patient taking differently: Inject 50 Units into the skin at bedtime. 01/02/23   Ricky Fines, MD  pantoprazole  (PROTONIX ) 40 MG tablet Take 40 mg by mouth daily.     [provider]  pravastatin  (PRAVACHOL ) 80 MG tablet Take 40 mg by mouth daily. 11/06/22   [provider]    Current Facility-Administered Medications  Medication Dose Route Frequency Provider Last Rate Last Admin   acetaminophen  (TYLENOL ) tablet 650 mg  650 mg Oral Q6H PRN Johnson, Clanford L, MD       Or   acetaminophen  (TYLENOL ) suppository 650 mg  650 mg Rectal Q6H PRN Vicci Afton CROME, MD  bisacodyl  (DULCOLAX) EC tablet 5 mg  5 mg Oral Daily PRN Johnson, Clanford L, MD       carvedilol  (COREG ) tablet 12.5 mg  12.5 mg Oral BID WC Johnson, Clanford L, MD       diltiazem  (CARDIZEM  CD) 24 hr capsule 240 mg  240 mg Oral Daily Johnson, Clanford L, MD       hydrALAZINE  (APRESOLINE )  injection 10 mg  10 mg Intravenous Q4H PRN Johnson, Clanford L, MD       hydrALAZINE  (APRESOLINE ) tablet 100 mg  100 mg Oral TID Johnson, Clanford L, MD       insulin  aspart (novoLOG ) injection 0-6 Units  0-6 Units Subcutaneous Q4H Johnson, Clanford L, MD       ondansetron  (ZOFRAN ) tablet 4 mg  4 mg Oral Q6H PRN Johnson, Clanford L, MD       Or   ondansetron  (ZOFRAN ) injection 4 mg  4 mg Intravenous Q6H PRN Johnson, Clanford L, MD       oxyCODONE  (Oxy IR/ROXICODONE ) immediate release tablet 5 mg  5 mg Oral Q6H PRN Johnson, Clanford L, MD       pantoprazole  (PROTONIX ) EC tablet 40 mg  40 mg Oral Daily Johnson, Clanford L, MD       pravastatin  (PRAVACHOL ) tablet 40 mg  40 mg Oral Daily Johnson, Clanford L, MD       Current Outpatient Medications  Medication Sig Dispense Refill   acetaminophen  (TYLENOL ) 325 MG tablet Take 650 mg by mouth every 6 (six) hours as needed for moderate pain.      aspirin  EC 81 MG tablet Take 1 tablet (81 mg total) by mouth daily. Swallow whole. 30 tablet 12   carvedilol  (COREG ) 12.5 MG tablet Take 1 tablet (12.5 mg total) by mouth 2 (two) times daily with a meal. 60 tablet 3   diltiazem  (CARDIZEM  CD) 240 MG 24 hr capsule Take 1 capsule (240 mg total) by mouth daily. 90 capsule 2   fluticasone (FLONASE) 50 MCG/ACT nasal spray Place into both nostrils.     hydrALAZINE  (APRESOLINE ) 100 MG tablet Take 1 tablet (100 mg total) by mouth 3 (three) times daily. 90 tablet 2   insulin  glargine (LANTUS ) 100 UNIT/ML injection Inject 0.06 mLs (6 Units total) into the skin at bedtime. (Patient taking differently: Inject 50 Units into the skin at bedtime.)     pantoprazole  (PROTONIX ) 40 MG tablet Take 40 mg by mouth daily.      pravastatin  (PRAVACHOL ) 80 MG tablet Take 40 mg by mouth daily.      Allergies as of 10/15/2024 - Review Complete 10/15/2024  Allergen Reaction Noted   Ace inhibitors Swelling 03/02/2020   Atorvastatin   01/26/2023    Family History  Problem Relation Age  of Onset   Cancer Mother    Cancer Sister    Chronic Renal Failure Neg Hx     Social History   Socioeconomic History   Marital status: Married    Spouse name: Not on file   Number of children: 2   Years of education: Not on file   Highest education level: 12th grade  Occupational History   Occupation: distribution work  Tobacco Use   Smoking status: Never   Smokeless tobacco: Never  Vaping Use   Vaping status: Never Used  Substance and Sexual Activity   Alcohol use: Not Currently    Comment: no h/o heavy use   Drug use: Not Currently    Types: Marijuana  Comment: none in 20 years   Sexual activity: Yes  Other Topics Concern   Not on file  Social History Narrative   Not on file   Social Drivers of Health   Tobacco Use: Low Risk (10/15/2024)   Patient History    Smoking Tobacco Use: Never    Smokeless Tobacco Use: Never    Passive Exposure: Not on file  Financial Resource Strain: Low Risk (01/12/2023)   Overall Financial Resource Strain (CARDIA)    Difficulty of Paying Living Expenses: Not very hard  Food Insecurity: No Food Insecurity (01/12/2023)   Hunger Vital Sign    Worried About Running Out of Food in the Last Year: Never true    Ran Out of Food in the Last Year: Never true  Transportation Needs: No Transportation Needs (01/12/2023)   PRAPARE - Administrator, Civil Service (Medical): No    Lack of Transportation (Non-Medical): No  Physical Activity: Not on file  Stress: Not on file  Social Connections: Not on file  Intimate Partner Violence: Not At Risk (01/01/2023)   Humiliation, Afraid, Rape, and Kick questionnaire    Fear of Current or Ex-Partner: No    Emotionally Abused: No    Physically Abused: No    Sexually Abused: No  Depression (PHQ2-9): Not on file  Alcohol Screen: Not on file  Housing: Low Risk (01/01/2023)   Housing    Last Housing Risk Score: 0  Utilities: Not At Risk (01/01/2023)   AHC Utilities    Threatened with loss of  utilities: No  Health Literacy: Not on file     Review of Systems   Gen: Denies any fever, chills, loss of appetite, change in weight or weight loss CV: Denies chest pain, heart palpitations, syncope, edema  Resp: Denies shortness of breath with rest, cough, wheezing, coughing up blood, and pleurisy. GI: denies melena, nausea, vomiting, diarrhea, constipation, dysphagia, odyonophagia, early satiety or weight loss. +BRBPR GU : Denies urinary burning, blood in urine, urinary frequency, and urinary incontinence. MS: Denies joint pain, limitation of movement, swelling, cramps, and atrophy.  Derm: Denies rash, itching, dry skin, hives. Psych: Denies depression, anxiety, memory loss, hallucinations, and confusion. Heme: Denies bruising or bleeding Neuro:  Denies any headaches, dizziness, paresthesias, shaking  Physical Exam   Vital Signs in last 24 hours: Temp:  [97.9 F (36.6 C)-98 F (36.7 C)] 97.9 F (36.6 C) (12/17 0622) Pulse Rate:  [66-75] 66 (12/17 0730) Resp:  [16-17] 16 (12/17 0622) BP: (158-202)/(64-81) 165/64 (12/17 0730) SpO2:  [96 %-100 %] 96 % (12/17 0730) Weight:  [79.6 kg] 79.6 kg (12/17 0621)   General:   Alert,  Well-developed, well-nourished, pleasant and cooperative in NAD Head:  Normocephalic and atraumatic. Eyes:  Sclera clear, no icterus.   Conjunctiva pink. Ears:  Normal auditory acuity. Mouth:  No deformity or lesions, dentition normal. Neck:  Supple; no masses Lungs:  Clear throughout to auscultation.   No wheezes, crackles, or rhonchi. No acute distress. Heart:  Regular rate and rhythm; no murmurs, clicks, rubs,  or gallops. Abdomen:  Soft, nontender and nondistended. No masses, hepatosplenomegaly or hernias noted. Normal bowel sounds, without guarding, and without rebound.   Msk:  Symmetrical without gross deformities. Normal posture. Extremities:  Without clubbing or edema. Neurologic:  Alert and  oriented x4. Skin:  Intact without significant lesions  or rashes. Psych:  Alert and cooperative. Normal mood and affect.   Labs/Studies   Recent Labs Recent Labs    10/15/24  0228 10/15/24 0639  WBC 6.6 8.5  HGB 9.5* 8.4*  HCT 30.1* 26.5*  PLT 231 219   BMET Recent Labs    10/15/24 0228  NA 140  K 3.6  CL 98  CO2 32  GLUCOSE 145*  BUN 36*  CREATININE 6.81*  CALCIUM  7.8*   LFT Recent Labs    10/15/24 0228  PROT 7.8  ALBUMIN  4.1  AST 15  ALT <5  ALKPHOS 40  BILITOT 0.3   PT/INR Recent Labs    10/15/24 0234  LABPROT 14.4  INR 1.1    Assessment   Omar Parker is a 78 y.o. year old male with history of ESRD on dialysis TTS, HTN, history of stroke, chronically on aspirin , HLD, GERD, type 2 diabetes mellitus, who presented to the ED earlier this morning complaining of bright red blood per rectum and tested Hemoccult positive with hemoglobin down from baseline, patient was initially planned for admission then left AMA and returned after another episode of rectal bleeding. GI consulted for further evaluation  Rectal bleeding and melena with acute on chronic anemia: -Sudden onset rectal bleeding yesterday -Denies melena, abdominal pain -no changes in appetite, nausea/vomiting or weight loss -Some fatigue but no sob or dizziness -Hgb 8.4, down from 9.5 yesterday -Takes baby ASA daily -Endorses daily BC powders -history of LGIB in 2021--suspected diverticular at that time   Sudden onset painless rectal bleeding without any melena or other associated symptoms. History of suspected diverticular bleed in 2021, notably taking NSAIDs at that time. He endorses BC powders daily at current. Last colonoscopy/EGD in 2021. At this time, he has no UGI symptoms and no melena, low suspicion for UGIB source, recommend proceeding with colonoscopy tomorrow for further evaluation, can pursue EGD if no findings on colonoscopy. Indications, risks and benefits of procedure discussed in detail with patient. Patient verbalized understanding and  is in agreement to proceed with Colonoscopy +/- EGD.     Plan / Recommendations   -trend h&h -Monitor for overt GI bleeding -Avoid all NSAIDs -PPI BID -colonoscopy tomorrow +/- EGD if no findings in LGI tract  -clear liquids today, NPO midnight     10/15/2024, 8:35 AM  Phynix Horton L. Iara Monds, MSN, APRN, AGNP-C Adult-Gerontology Nurse Practitioner [facility]

## 2024-10-15 NOTE — ED Triage Notes (Signed)
 Pt BIB [facility] from home for c/o rectal bleeding; pt was just discharged for same complaint but states when he got home he felt like he was going to pass out  Pt states when he got home he had another BM that was bloody

## 2024-10-15 NOTE — H&P (Addendum)
 History and Physical  [facility]  Omar Parker FMW:[id number] DOB: [birth year] DOA: 10/15/2024  PCP: Pcp, No  Patient coming from: Home  Level of care: Telemetry  I have personally briefly reviewed patient's old medical records in [facility]  Chief Complaint: bright red rectal bleeding  HPI: Omar Parker is a 78 year old male with ESRD on dialysis TTS, hypertension, history of stroke, chronically on aspirin , hyperlipidemia, GERD, type 2 diabetes mellitus, presented to the ED earlier this morning complaining of bright red blood per rectum and tested Hemoccult positive with hemoglobin down from baseline of about 10-8.4.  He is not on full anticoagulation.  He has some fatigue but no dizziness or weakness or falling.  Admission was initially requested in the ED however patient declined and left AGAINST MEDICAL ADVICE.  He has subsequently returned to the ED after having another bloody bowel movement at home.  He is now agreeable to hospital admission.    Past Medical History:  Diagnosis Date   Anemia    IDA   Diabetes mellitus without complication (HCC)    GERD (gastroesophageal reflux disease)    High cholesterol    Hypertension    Stage 4 chronic kidney disease (HCC)     Past Surgical History:  Procedure Laterality Date   A/V SHUNT INTERVENTION Left 05/30/2024   Procedure: A/V SHUNT INTERVENTION;  Surgeon: Gretta Lonni PARAS, MD;  Location: [facility];  Service: Cardiovascular;  Laterality: Left;   AV FISTULA PLACEMENT Left 06/28/2020   Procedure: LEFT UPPER ARM ARTERIOVENOUS (AV) GORE-TEX GRAFT;  Surgeon: Harvey Carlin BRAVO, MD;  Location: [facility] OR;  Service: Vascular;  Laterality: Left;   COLONOSCOPY N/A 05/16/2017   Procedure: COLONOSCOPY;  Surgeon: Golda Claudis PENNER, MD;  [facility];  Service: Endoscopy;  Laterality: N/A;  7:30   COLONOSCOPY N/A 04/10/2020   Procedure: COLONOSCOPY;  Surgeon: Shaaron Lamar HERO, MD;  [facility];  Service: Endoscopy;   Laterality: N/A;   ESOPHAGOGASTRODUODENOSCOPY N/A 04/10/2020   Procedure: ESOPHAGOGASTRODUODENOSCOPY (EGD);  Surgeon: Shaaron Lamar HERO, MD;  [facility];  Service: Endoscopy;  Laterality: N/A;   HERNIA REPAIR     INSERTION OF DIALYSIS CATHETER  06/28/2020   Procedure: INSERTION OF DIALYSIS CATHETER;  Surgeon: Harvey Carlin BRAVO, MD;  Location: [facility] OR;  Service: Vascular;;   IR REMOVAL TUN CV CATH W/O FL  09/21/2020   POLYPECTOMY  04/10/2020   Procedure: POLYPECTOMY;  Surgeon: Shaaron Lamar HERO, MD;  [facility];  Service: Endoscopy;;  cecal;     reports that he has never smoked. He has never used smokeless tobacco. He reports that he does not currently use alcohol. He reports that he does not currently use drugs after having used the following drugs: Marijuana.  Allergies[1]  Family History  Problem Relation Age of Onset   Cancer Mother    Cancer Sister    Chronic Renal Failure Neg Hx     Prior to Admission medications  Medication Sig Start Date End Date Taking? Authorizing Provider  acetaminophen  (TYLENOL ) 325 MG tablet Take 650 mg by mouth every 6 (six) hours as needed for moderate pain.     [provider]  aspirin  EC 81 MG tablet Take 1 tablet (81 mg total) by mouth daily. Swallow whole. 04/08/22   Vicci Afton LITTIE, MD  carvedilol  (COREG ) 12.5 MG tablet Take 1 tablet (12.5 mg total) by mouth 2 (two) times daily with a meal. 01/02/23 12/07/23  Ricky,  Eric, MD  diltiazem  (CARDIZEM  CD) 240 MG 24 hr capsule Take 1 capsule (240 mg total) by mouth daily. 12/07/23   Mallipeddi, Vishnu P, MD  fluticasone (FLONASE) 50 MCG/ACT nasal spray Place into both nostrils. 12/04/23   [provider]  hydrALAZINE  (APRESOLINE ) 100 MG tablet Take 1 tablet (100 mg total) by mouth 3 (three) times daily. 01/02/23 12/07/23  Ricky Eric, MD  insulin  glargine (LANTUS ) 100 UNIT/ML injection Inject 0.06 mLs (6 Units total) into the skin at bedtime. Patient taking differently:  Inject 50 Units into the skin at bedtime. 01/02/23   Ricky Eric, MD  pantoprazole  (PROTONIX ) 40 MG tablet Take 40 mg by mouth daily.     [provider]  pravastatin  (PRAVACHOL ) 80 MG tablet Take 40 mg by mouth daily. 11/06/22   [provider]    Physical Exam: Vitals:   10/15/24 0830 10/15/24 0930 10/15/24 1000 10/15/24 1100  BP: (!) 170/65 (!) 153/67 (!) 154/65 (!) 141/95  Pulse: 66 67 65 66  Resp:      Temp:      TempSrc:      SpO2: 99% 98% 97% 99%  Weight:      Height:        Constitutional: NAD, calm, comfortable Eyes: PERRL, lids and conjunctivae normal ENMT: Mucous membranes are moist. Posterior pharynx clear of any exudate or lesions.Normal dentition.  Neck: normal, supple, no masses, no thyromegaly Respiratory: clear to auscultation bilaterally, no wheezing, no crackles. Normal respiratory effort. No accessory muscle use.  Cardiovascular: normal s1, s2 sounds, no murmurs / rubs / gallops. No extremity edema. 2+ pedal pulses. No carotid bruits.  Abdomen: no tenderness, no masses palpated. No hepatosplenomegaly. Bowel sounds positive.  Musculoskeletal: no clubbing / cyanosis. No joint deformity upper and lower extremities. Good ROM, no contractures. Normal muscle tone.  Skin: no rashes, lesions, ulcers. No induration Neurologic: CN 2-12 grossly intact. Sensation intact, DTR normal. Strength 5/5 in all 4.  Psychiatric: Normal judgment and insight. Alert and oriented x 3. Normal mood.   Labs on Admission: I have personally reviewed following labs and imaging studies  CBC: Recent Labs  Lab 10/15/24 0228 10/15/24 0639  WBC 6.6 8.5  HGB 9.5* 8.4*  HCT 30.1* 26.5*  MCV 82.9 83.3  PLT 231 219   Basic Metabolic Panel: Recent Labs  Lab 10/15/24 0228  NA 140  K 3.6  CL 98  CO2 32  GLUCOSE 145*  BUN 36*  CREATININE 6.81*  CALCIUM  7.8*   GFR: Estimated Creatinine Clearance: 9.8 mL/min (A) (by C-G formula based on SCr of 6.81 mg/dL (H)). Liver  Function Tests: Recent Labs  Lab 10/15/24 0228  AST 15  ALT <5  ALKPHOS 40  BILITOT 0.3  PROT 7.8  ALBUMIN  4.1   No results for input(s): LIPASE, AMYLASE in the last 168 hours. No results for input(s): AMMONIA in the last 168 hours. Coagulation Profile: Recent Labs  Lab 10/15/24 0234  INR 1.1   Cardiac Enzymes: No results for input(s): CKTOTAL, CKMB, CKMBINDEX, TROPONINI in the last 168 hours. BNP (last 3 results) No results for input(s): PROBNP in the last 8760 hours. HbA1C: No results for input(s): HGBA1C in the last 72 hours. CBG: Recent Labs  Lab 10/15/24 0837  GLUCAP 156*   Lipid Profile: No results for input(s): CHOL, HDL, LDLCALC, TRIG, CHOLHDL, LDLDIRECT in the last 72 hours. Thyroid  Function Tests: No results for input(s): TSH, T4TOTAL, FREET4, T3FREE, THYROIDAB in the last 72 hours. Anemia Panel: No results  for input(s): VITAMINB12, FOLATE, FERRITIN, TIBC, IRON , RETICCTPCT in the last 72 hours. Urine analysis:    Component Value Date/Time   COLORURINE STRAW (A) 12/31/2022 2001   APPEARANCEUR CLEAR 12/31/2022 2001   LABSPEC 1.012 12/31/2022 2001   PHURINE 8.0 12/31/2022 2001   GLUCOSEU >=500 (A) 12/31/2022 2001   HGBUR NEGATIVE 12/31/2022 2001   BILIRUBINUR NEGATIVE 12/31/2022 2001   KETONESUR NEGATIVE 12/31/2022 2001   PROTEINUR >=300 (A) 12/31/2022 2001   NITRITE NEGATIVE 12/31/2022 2001   LEUKOCYTESUR NEGATIVE 12/31/2022 2001    Radiological Exams on Admission: No results found.  Assessment/Plan Principal Problem:   BRBPR (bright red blood per rectum) Active Problems:   Hypertension   High cholesterol   Lower GI bleeding   Acute blood loss anemia   Anemia in chronic kidney disease (CKD)   Type 2 diabetes mellitus with renal complication (HCC)   Aspirin  long-term use   ESRD on dialysis (HCC)   GERD (gastroesophageal reflux disease)   History of CVA (cerebrovascular accident)   Mild  aortic regurgitation   Severe sinus bradycardia   BRBPR ABLA Lower GI bleeding -- Hg down to 8.4 from recent testing earlier this morning of 9.5 -- hemoccult positive stools -- Typed and crossed -- Hold daily aspirin  -- Follow CBC closely -- GI consultation requested -- anemia panel   Essential hypertension -- elevated BPs likely from not taking home meds -- resume home BP meds and monitor -- IV hydralazine  ordered PRN for elevated BP readings  GERD -- resume daily pantoprazole  therapy  ESRD on HD  -- reports HD on TTS and not missed any recent treatments -- consult to nephrology inpatient   Hyperlipidemia -- resume home pravastatin  therapy   DVT prophylaxis: SCDs  Code Status: FULL   Family Communication:   Disposition Plan:   Consults called: GI, nephrology   Admission status: OBV  Time spent: 65 mins   Level of care: Telemetry Afton Louder MD [facility] How to contact the [facility] Attending or Consulting provider 7A - 7P or covering provider during after hours 7P -7A, for this patient?  Check the care team in [facility] and look for a) attending/consulting [facility] provider listed and b) the [facility] team listed Log into [email or web address] and use [place]'s universal password to access. If you do not have the password, please contact the hospital operator. Locate the [facility] provider you are looking for under [facility] and page to a number that you can be directly reached. If you still have difficulty reaching the provider, please page the [facility] (Director on Call) for the Hospitalists listed on amion for assistance.   If 7PM-7AM, please contact night-coverage [email or web address] Password [facility]  10/15/2024, 11:43 AM        [1]  Allergies Allergen Reactions   Ace Inhibitors Swelling   Atorvastatin      Sweating and pt reports interferes w/diabetes &bp

## 2024-10-15 NOTE — TOC CM/SW Note (Signed)
 Transition of Care [facility]) - Inpatient Brief Assessment   Patient Details  Name: Omar Parker MRN: [id number] Date of Birth: 1946-01-02  Transition of Care [facility]) CM/SW Contact:    Lucie Lunger, LCSWA Phone Number: 10/15/2024, 10:32 AM   Clinical Narrative: Transition of Care Department [facility]) has reviewed patient and no TOC needs have been identified at this time. We will continue to monitor patient advancement through interdiciplinary progression rounds. If new patient transition needs arise, please place a TOC consult.  Transition of Care Asessment: Insurance and Status: Insurance coverage has been reviewed Patient has primary care physician: Yes Home environment has been reviewed: From home Prior level of function:: Independent Prior/Current Home Services: No current home services Social Drivers of Health Review: SDOH reviewed no interventions necessary Readmission risk has been reviewed: Yes Transition of care needs: no transition of care needs at this time

## 2024-10-15 NOTE — ED Triage Notes (Signed)
 Pt bib EMS from home with c/o dark red/maroon like jelly in stools x 2.

## 2024-10-15 NOTE — H&P (View-Only) (Signed)
 Gastroenterology Consult   Referring Provider: No ref. provider found Primary Care Physician:  Pcp, No Primary Gastroenterologist:  Previously DR. Rehman   Patient ID: Omar Parker; [id number]; February 27, 1946   Admit date: 10/15/2024  LOS: 0 days   Date of Consultation: 10/15/2024  Reason for Consultation:  rectal bleeding  History of Present Illness   Omar Parker is a 78 y.o. year old male with history of ESRD on dialysis TTS, HTN, history of stroke, chronically on aspirin , HLD, GERD, type 2 diabetes mellitus, who presented to the ED earlier this morning complaining of bright red blood per rectum and tested Hemoccult positive with hemoglobin down from baseline, patient was initially planned for admission then left AMA and returned after another episode of rectal bleeding. GI consulted for further evaluation  ED Course:  VSS Hgb 9.5>>8.4>>  BUN 36( below baseline) Calcium  7.8  FOB positive   Consult: States he had dialysis on Tuesday and felt fine. He came home and had sensation to defecate, when he went to the restroom he noted bright red to darker blood mixed in with the stool. He had a subsequent BM with mostly just BRB. Denies any black color to the stool. No nausea or vomiting. He takes daily baby aspirin . He takes [facility] powder usually atleast once per day. Has been doing this for a while. No constipation or diarrhea. Denies any abdominal pain. No changes in appetite or weight loss. He endorses some fatigue/weakness but no dizziness or sob. History of LGIB in 2021, suspected possibly diverticular, in setting of ongoing NSAID use.   Denies ETOH or tobacco use  Last EGD:03/2020 - Normal esophagus.                           - Small hiatal hernia.                           - Erosive gastropathy with no stigmata of recent                            bleeding -biopsied. Patient likely bled from his                            lower GI tract.  Last Colonoscopy: 03/2020 - Diverticulosis in  the sigmoid colon and in the                            descending colon.                           - Three 4 to 8 mm polyps in the cecum, removed with                            a cold snare. Resected and retrieved. I suppose he                            could have bled from a diverticulum. However, no                            stigmata whatsoever.                           -  The examination was otherwise normal on direct                            and retroflexion views.  Past Medical History:  Diagnosis Date   Anemia    IDA   Diabetes mellitus without complication (HCC)    GERD (gastroesophageal reflux disease)    High cholesterol    Hypertension    Stage 4 chronic kidney disease (HCC)     Past Surgical History:  Procedure Laterality Date   A/V SHUNT INTERVENTION Left 05/30/2024   Procedure: A/V SHUNT INTERVENTION;  Surgeon: Gretta Lonni PARAS, MD;  Location: [facility];  Service: Cardiovascular;  Laterality: Left;   AV FISTULA PLACEMENT Left 06/28/2020   Procedure: LEFT UPPER ARM ARTERIOVENOUS (AV) GORE-TEX GRAFT;  Surgeon: Harvey Carlin BRAVO, MD;  Location: [facility] OR;  Service: Vascular;  Laterality: Left;   COLONOSCOPY N/A 05/16/2017   Procedure: COLONOSCOPY;  Surgeon: Golda Claudis PENNER, MD;  [facility];  Service: Endoscopy;  Laterality: N/A;  7:30   COLONOSCOPY N/A 04/10/2020   Procedure: COLONOSCOPY;  Surgeon: Shaaron Lamar HERO, MD;  [facility];  Service: Endoscopy;  Laterality: N/A;   ESOPHAGOGASTRODUODENOSCOPY N/A 04/10/2020   Procedure: ESOPHAGOGASTRODUODENOSCOPY (EGD);  Surgeon: Shaaron Lamar HERO, MD;  [facility];  Service: Endoscopy;  Laterality: N/A;   HERNIA REPAIR     INSERTION OF DIALYSIS CATHETER  06/28/2020   Procedure: INSERTION OF DIALYSIS CATHETER;  Surgeon: Harvey Carlin BRAVO, MD;  Location: [facility] OR;  Service: Vascular;;   IR REMOVAL TUN CV CATH W/O FL  09/21/2020   POLYPECTOMY  04/10/2020   Procedure: POLYPECTOMY;  Surgeon: Shaaron Lamar HERO, MD;  [facility];  Service: Endoscopy;;  cecal;    Prior to Admission medications  Medication Sig Start Date End Date Taking? Authorizing Provider  acetaminophen  (TYLENOL ) 325 MG tablet Take 650 mg by mouth every 6 (six) hours as needed for moderate pain.     [provider]  aspirin  EC 81 MG tablet Take 1 tablet (81 mg total) by mouth daily. Swallow whole. 04/08/22   Vicci Afton CROME, MD  carvedilol  (COREG ) 12.5 MG tablet Take 1 tablet (12.5 mg total) by mouth 2 (two) times daily with a meal. 01/02/23 12/07/23  Ricky Fines, MD  diltiazem  (CARDIZEM  CD) 240 MG 24 hr capsule Take 1 capsule (240 mg total) by mouth daily. 12/07/23   Mallipeddi, Vishnu P, MD  fluticasone (FLONASE) 50 MCG/ACT nasal spray Place into both nostrils. 12/04/23   [provider]  hydrALAZINE  (APRESOLINE ) 100 MG tablet Take 1 tablet (100 mg total) by mouth 3 (three) times daily. 01/02/23 12/07/23  Ricky Fines, MD  insulin  glargine (LANTUS ) 100 UNIT/ML injection Inject 0.06 mLs (6 Units total) into the skin at bedtime. Patient taking differently: Inject 50 Units into the skin at bedtime. 01/02/23   Ricky Fines, MD  pantoprazole  (PROTONIX ) 40 MG tablet Take 40 mg by mouth daily.     [provider]  pravastatin  (PRAVACHOL ) 80 MG tablet Take 40 mg by mouth daily. 11/06/22   [provider]    Current Facility-Administered Medications  Medication Dose Route Frequency Provider Last Rate Last Admin   acetaminophen  (TYLENOL ) tablet 650 mg  650 mg Oral Q6H PRN Johnson, Clanford L, MD       Or   acetaminophen  (TYLENOL ) suppository 650 mg  650 mg Rectal Q6H PRN Vicci Afton CROME, MD  bisacodyl  (DULCOLAX) EC tablet 5 mg  5 mg Oral Daily PRN Johnson, Clanford L, MD       carvedilol  (COREG ) tablet 12.5 mg  12.5 mg Oral BID WC Johnson, Clanford L, MD       diltiazem  (CARDIZEM  CD) 24 hr capsule 240 mg  240 mg Oral Daily Johnson, Clanford L, MD       hydrALAZINE  (APRESOLINE )  injection 10 mg  10 mg Intravenous Q4H PRN Johnson, Clanford L, MD       hydrALAZINE  (APRESOLINE ) tablet 100 mg  100 mg Oral TID Johnson, Clanford L, MD       insulin  aspart (novoLOG ) injection 0-6 Units  0-6 Units Subcutaneous Q4H Johnson, Clanford L, MD       ondansetron  (ZOFRAN ) tablet 4 mg  4 mg Oral Q6H PRN Johnson, Clanford L, MD       Or   ondansetron  (ZOFRAN ) injection 4 mg  4 mg Intravenous Q6H PRN Johnson, Clanford L, MD       oxyCODONE  (Oxy IR/ROXICODONE ) immediate release tablet 5 mg  5 mg Oral Q6H PRN Johnson, Clanford L, MD       pantoprazole  (PROTONIX ) EC tablet 40 mg  40 mg Oral Daily Johnson, Clanford L, MD       pravastatin  (PRAVACHOL ) tablet 40 mg  40 mg Oral Daily Johnson, Clanford L, MD       Current Outpatient Medications  Medication Sig Dispense Refill   acetaminophen  (TYLENOL ) 325 MG tablet Take 650 mg by mouth every 6 (six) hours as needed for moderate pain.      aspirin  EC 81 MG tablet Take 1 tablet (81 mg total) by mouth daily. Swallow whole. 30 tablet 12   carvedilol  (COREG ) 12.5 MG tablet Take 1 tablet (12.5 mg total) by mouth 2 (two) times daily with a meal. 60 tablet 3   diltiazem  (CARDIZEM  CD) 240 MG 24 hr capsule Take 1 capsule (240 mg total) by mouth daily. 90 capsule 2   fluticasone (FLONASE) 50 MCG/ACT nasal spray Place into both nostrils.     hydrALAZINE  (APRESOLINE ) 100 MG tablet Take 1 tablet (100 mg total) by mouth 3 (three) times daily. 90 tablet 2   insulin  glargine (LANTUS ) 100 UNIT/ML injection Inject 0.06 mLs (6 Units total) into the skin at bedtime. (Patient taking differently: Inject 50 Units into the skin at bedtime.)     pantoprazole  (PROTONIX ) 40 MG tablet Take 40 mg by mouth daily.      pravastatin  (PRAVACHOL ) 80 MG tablet Take 40 mg by mouth daily.      Allergies as of 10/15/2024 - Review Complete 10/15/2024  Allergen Reaction Noted   Ace inhibitors Swelling 03/02/2020   Atorvastatin   01/26/2023    Family History  Problem Relation Age  of Onset   Cancer Mother    Cancer Sister    Chronic Renal Failure Neg Hx     Social History   Socioeconomic History   Marital status: Married    Spouse name: Not on file   Number of children: 2   Years of education: Not on file   Highest education level: 12th grade  Occupational History   Occupation: distribution work  Tobacco Use   Smoking status: Never   Smokeless tobacco: Never  Vaping Use   Vaping status: Never Used  Substance and Sexual Activity   Alcohol use: Not Currently    Comment: no h/o heavy use   Drug use: Not Currently    Types: Marijuana  Comment: none in 20 years   Sexual activity: Yes  Other Topics Concern   Not on file  Social History Narrative   Not on file   Social Drivers of Health   Tobacco Use: Low Risk (10/15/2024)   Patient History    Smoking Tobacco Use: Never    Smokeless Tobacco Use: Never    Passive Exposure: Not on file  Financial Resource Strain: Low Risk (01/12/2023)   Overall Financial Resource Strain (CARDIA)    Difficulty of Paying Living Expenses: Not very hard  Food Insecurity: No Food Insecurity (01/12/2023)   Hunger Vital Sign    Worried About Running Out of Food in the Last Year: Never true    Ran Out of Food in the Last Year: Never true  Transportation Needs: No Transportation Needs (01/12/2023)   PRAPARE - Administrator, Civil Service (Medical): No    Lack of Transportation (Non-Medical): No  Physical Activity: Not on file  Stress: Not on file  Social Connections: Not on file  Intimate Partner Violence: Not At Risk (01/01/2023)   Humiliation, Afraid, Rape, and Kick questionnaire    Fear of Current or Ex-Partner: No    Emotionally Abused: No    Physically Abused: No    Sexually Abused: No  Depression (PHQ2-9): Not on file  Alcohol Screen: Not on file  Housing: Low Risk (01/01/2023)   Housing    Last Housing Risk Score: 0  Utilities: Not At Risk (01/01/2023)   AHC Utilities    Threatened with loss of  utilities: No  Health Literacy: Not on file     Review of Systems   Gen: Denies any fever, chills, loss of appetite, change in weight or weight loss CV: Denies chest pain, heart palpitations, syncope, edema  Resp: Denies shortness of breath with rest, cough, wheezing, coughing up blood, and pleurisy. GI: denies melena, nausea, vomiting, diarrhea, constipation, dysphagia, odyonophagia, early satiety or weight loss. +BRBPR GU : Denies urinary burning, blood in urine, urinary frequency, and urinary incontinence. MS: Denies joint pain, limitation of movement, swelling, cramps, and atrophy.  Derm: Denies rash, itching, dry skin, hives. Psych: Denies depression, anxiety, memory loss, hallucinations, and confusion. Heme: Denies bruising or bleeding Neuro:  Denies any headaches, dizziness, paresthesias, shaking  Physical Exam   Vital Signs in last 24 hours: Temp:  [97.9 F (36.6 C)-98 F (36.7 C)] 97.9 F (36.6 C) (12/17 0622) Pulse Rate:  [66-75] 66 (12/17 0730) Resp:  [16-17] 16 (12/17 0622) BP: (158-202)/(64-81) 165/64 (12/17 0730) SpO2:  [96 %-100 %] 96 % (12/17 0730) Weight:  [79.6 kg] 79.6 kg (12/17 0621)   General:   Alert,  Well-developed, well-nourished, pleasant and cooperative in NAD Head:  Normocephalic and atraumatic. Eyes:  Sclera clear, no icterus.   Conjunctiva pink. Ears:  Normal auditory acuity. Mouth:  No deformity or lesions, dentition normal. Neck:  Supple; no masses Lungs:  Clear throughout to auscultation.   No wheezes, crackles, or rhonchi. No acute distress. Heart:  Regular rate and rhythm; no murmurs, clicks, rubs,  or gallops. Abdomen:  Soft, nontender and nondistended. No masses, hepatosplenomegaly or hernias noted. Normal bowel sounds, without guarding, and without rebound.   Msk:  Symmetrical without gross deformities. Normal posture. Extremities:  Without clubbing or edema. Neurologic:  Alert and  oriented x4. Skin:  Intact without significant lesions  or rashes. Psych:  Alert and cooperative. Normal mood and affect.   Labs/Studies   Recent Labs Recent Labs    10/15/24  0228 10/15/24 0639  WBC 6.6 8.5  HGB 9.5* 8.4*  HCT 30.1* 26.5*  PLT 231 219   BMET Recent Labs    10/15/24 0228  NA 140  K 3.6  CL 98  CO2 32  GLUCOSE 145*  BUN 36*  CREATININE 6.81*  CALCIUM  7.8*   LFT Recent Labs    10/15/24 0228  PROT 7.8  ALBUMIN  4.1  AST 15  ALT <5  ALKPHOS 40  BILITOT 0.3   PT/INR Recent Labs    10/15/24 0234  LABPROT 14.4  INR 1.1    Assessment   Omar Parker is a 78 y.o. year old male with history of ESRD on dialysis TTS, HTN, history of stroke, chronically on aspirin , HLD, GERD, type 2 diabetes mellitus, who presented to the ED earlier this morning complaining of bright red blood per rectum and tested Hemoccult positive with hemoglobin down from baseline, patient was initially planned for admission then left AMA and returned after another episode of rectal bleeding. GI consulted for further evaluation  Rectal bleeding and melena with acute on chronic anemia: -Sudden onset rectal bleeding yesterday -Denies melena, abdominal pain -no changes in appetite, nausea/vomiting or weight loss -Some fatigue but no sob or dizziness -Hgb 8.4, down from 9.5 yesterday -Takes baby ASA daily -Endorses daily BC powders -history of LGIB in 2021--suspected diverticular at that time   Sudden onset painless rectal bleeding without any melena or other associated symptoms. History of suspected diverticular bleed in 2021, notably taking NSAIDs at that time. He endorses BC powders daily at current. Last colonoscopy/EGD in 2021. At this time, he has no UGI symptoms and no melena, low suspicion for UGIB source, recommend proceeding with colonoscopy tomorrow for further evaluation, can pursue EGD if no findings on colonoscopy. Indications, risks and benefits of procedure discussed in detail with patient. Patient verbalized understanding and  is in agreement to proceed with Colonoscopy +/- EGD.     Plan / Recommendations   -trend h&h -Monitor for overt GI bleeding -Avoid all NSAIDs -PPI BID -colonoscopy tomorrow +/- EGD if no findings in LGI tract  -clear liquids today, NPO midnight     10/15/2024, 8:35 AM  Phynix Horton L. Iara Monds, MSN, APRN, AGNP-C Adult-Gerontology Nurse Practitioner [facility]

## 2024-10-15 NOTE — ED Provider Notes (Signed)
 [place] EMERGENCY DEPARTMENT AT [facility] Provider Note   CSN: [id number] Arrival date & time: 10/15/24  9387     Patient presents with: Rectal Bleeding   Omar Parker is a 78 y.o. male.   The history is provided by the patient.  Patient returns to the ER for continued rectal bleeding.  Patient was seen earlier tonight for the same condition, but decided to leave AGAINST MEDICAL ADVICE.  He reports after he went home he had a large bloody bowel movement and felt lightheaded.  No syncope.  No chest pain or abdominal pain.  No vomiting. He is now requesting admission. He receives dialysis on Tuesday Thursday Saturday, no recent missed sessions    Prior to Admission medications  Medication Sig Start Date End Date Taking? Authorizing Provider  acetaminophen  (TYLENOL ) 325 MG tablet Take 650 mg by mouth every 6 (six) hours as needed for moderate pain.     [provider]  aspirin  EC 81 MG tablet Take 1 tablet (81 mg total) by mouth daily. Swallow whole. 04/08/22   Vicci Afton LITTIE, MD  carvedilol  (COREG ) 12.5 MG tablet Take 1 tablet (12.5 mg total) by mouth 2 (two) times daily with a meal. 01/02/23 12/07/23  Ricky Fines, MD  diltiazem  (CARDIZEM  CD) 240 MG 24 hr capsule Take 1 capsule (240 mg total) by mouth daily. 12/07/23   Mallipeddi, Vishnu P, MD  fluticasone (FLONASE) 50 MCG/ACT nasal spray Place into both nostrils. 12/04/23   [provider]  hydrALAZINE  (APRESOLINE ) 100 MG tablet Take 1 tablet (100 mg total) by mouth 3 (three) times daily. 01/02/23 12/07/23  Ricky Fines, MD  insulin  glargine (LANTUS ) 100 UNIT/ML injection Inject 0.06 mLs (6 Units total) into the skin at bedtime. Patient taking differently: Inject 50 Units into the skin at bedtime. 01/02/23   Ricky Fines, MD  pantoprazole  (PROTONIX ) 40 MG tablet Take 40 mg by mouth daily.     [provider]  pravastatin  (PRAVACHOL ) 80 MG tablet Take 40 mg by mouth daily. 11/06/22   [provider]    Allergies: Ace inhibitors and Atorvastatin     Review of Systems  Constitutional:  Negative for fever.  Gastrointestinal:  Positive for blood in stool. Negative for abdominal pain and vomiting.  Neurological:  Positive for light-headedness. Negative for syncope.    Updated Vital Signs BP (!) 202/81 (BP Location: Right Arm)   Pulse 68   Temp 97.9 F (36.6 C) (Oral)   Resp 16   Ht 1.803 m (5' 11)   Wt 79.6 kg   SpO2 100%   BMI 24.48 kg/m   Physical Exam CONSTITUTIONAL: Elderly, mildly anxious HEAD: Normocephalic/atraumatic ENMT: Mucous membranes moist NECK: supple no meningeal signs CV: S1/S2 noted, no murmurs/rubs/gallops noted LUNGS: Lungs are clear to auscultation bilaterally, no apparent distress ABDOMEN: soft, nontender NEURO: Pt is awake/alert/appropriate, moves all extremitiesx4.  No facial droop.   EXTREMITIES:  dialysis access with thrill noted SKIN: warm, color normal  (all labs ordered are listed, but only abnormal results are displayed) Labs Reviewed  CBC  TYPE AND SCREEN    EKG: None  Radiology: No results found.   Procedures   Medications Ordered in the ED - No data to display  Clinical Course as of 10/15/24 0700  Wed Oct 15, 2024  0629 Patient currently stable but reports feeling lightheaded after having large bloody bowel movement at home No syncope. Will check CBC and then admit.  Patient reports he has no objections  to receiving blood products [DW]  [phone] Signed out to dr lockwood with admission pending [DW]    Clinical Course User Index [DW] Midge Golas, MD                                 Medical Decision Making Amount and/or Complexity of Data Reviewed Labs: ordered.   This patient presents to the ED for concern of rectal bleeding, this involves an extensive number of treatment options, and is a complaint that carries with it a high risk of complications and morbidity.  The differential diagnosis includes but  is not limited to hemorrhoidal bleeding, anal fissure, diverticular reticulosis, upper GI bleed  Comorbidities that complicate the patient evaluation: Patients presentation is complicated by their history of end-stage renal disease  Critical Interventions:   patient to be admitted for rectal bleeding  Complexity of problems addressed: Patients presentation is most consistent with  acute presentation with potential threat to life or bodily function  Disposition: After consideration of the diagnostic results and the patients response to treatment,  I feel that the patent would benefit from admission  .        Final diagnoses:  Rectal bleeding  ESRD (end stage renal disease) [facility])    ED Discharge Orders     None          Midge Golas, MD 10/15/24 0700

## 2024-10-15 NOTE — ED Notes (Signed)
 Pt resting. Nad. A/o. Color wnl. Has only had one bright red bloody stool since D/C.

## 2024-10-15 NOTE — Plan of Care (Signed)
   Problem: Education: Goal: Ability to describe self-care measures that may prevent or decrease complications (Diabetes Survival Skills Education) will improve Outcome: Progressing Goal: Individualized Educational Video(s) Outcome: Progressing

## 2024-10-16 ENCOUNTER — Telehealth: Payer: Self-pay | Admitting: Gastroenterology

## 2024-10-16 ENCOUNTER — Encounter (HOSPITAL_COMMUNITY): Admission: EM | Payer: Self-pay | Source: Home / Self Care

## 2024-10-16 ENCOUNTER — Observation Stay (HOSPITAL_COMMUNITY): Admitting: Anesthesiology

## 2024-10-16 DIAGNOSIS — K921 Melena: Secondary | ICD-10-CM | POA: Diagnosis present

## 2024-10-16 DIAGNOSIS — K3189 Other diseases of stomach and duodenum: Secondary | ICD-10-CM

## 2024-10-16 DIAGNOSIS — E119 Type 2 diabetes mellitus without complications: Secondary | ICD-10-CM | POA: Diagnosis not present

## 2024-10-16 DIAGNOSIS — D62 Acute posthemorrhagic anemia: Secondary | ICD-10-CM | POA: Diagnosis present

## 2024-10-16 DIAGNOSIS — E7800 Pure hypercholesterolemia, unspecified: Secondary | ICD-10-CM | POA: Diagnosis present

## 2024-10-16 DIAGNOSIS — K5791 Diverticulosis of intestine, part unspecified, without perforation or abscess with bleeding: Secondary | ICD-10-CM | POA: Diagnosis present

## 2024-10-16 DIAGNOSIS — K5731 Diverticulosis of large intestine without perforation or abscess with bleeding: Secondary | ICD-10-CM | POA: Diagnosis not present

## 2024-10-16 DIAGNOSIS — I120 Hypertensive chronic kidney disease with stage 5 chronic kidney disease or end stage renal disease: Secondary | ICD-10-CM | POA: Diagnosis present

## 2024-10-16 DIAGNOSIS — K625 Hemorrhage of anus and rectum: Secondary | ICD-10-CM

## 2024-10-16 DIAGNOSIS — K2970 Gastritis, unspecified, without bleeding: Secondary | ICD-10-CM | POA: Diagnosis present

## 2024-10-16 DIAGNOSIS — Z992 Dependence on renal dialysis: Secondary | ICD-10-CM | POA: Diagnosis not present

## 2024-10-16 DIAGNOSIS — K648 Other hemorrhoids: Secondary | ICD-10-CM | POA: Diagnosis present

## 2024-10-16 DIAGNOSIS — D122 Benign neoplasm of ascending colon: Secondary | ICD-10-CM

## 2024-10-16 DIAGNOSIS — K633 Ulcer of intestine: Secondary | ICD-10-CM | POA: Diagnosis not present

## 2024-10-16 DIAGNOSIS — N186 End stage renal disease: Secondary | ICD-10-CM | POA: Diagnosis present

## 2024-10-16 DIAGNOSIS — K449 Diaphragmatic hernia without obstruction or gangrene: Secondary | ICD-10-CM

## 2024-10-16 DIAGNOSIS — K635 Polyp of colon: Secondary | ICD-10-CM | POA: Diagnosis not present

## 2024-10-16 DIAGNOSIS — K219 Gastro-esophageal reflux disease without esophagitis: Secondary | ICD-10-CM | POA: Diagnosis present

## 2024-10-16 DIAGNOSIS — Z79899 Other long term (current) drug therapy: Secondary | ICD-10-CM | POA: Diagnosis not present

## 2024-10-16 DIAGNOSIS — Z8673 Personal history of transient ischemic attack (TIA), and cerebral infarction without residual deficits: Secondary | ICD-10-CM | POA: Diagnosis not present

## 2024-10-16 DIAGNOSIS — D631 Anemia in chronic kidney disease: Secondary | ICD-10-CM | POA: Diagnosis present

## 2024-10-16 DIAGNOSIS — D123 Benign neoplasm of transverse colon: Secondary | ICD-10-CM

## 2024-10-16 DIAGNOSIS — N2581 Secondary hyperparathyroidism of renal origin: Secondary | ICD-10-CM | POA: Diagnosis present

## 2024-10-16 DIAGNOSIS — Z7982 Long term (current) use of aspirin: Secondary | ICD-10-CM | POA: Diagnosis not present

## 2024-10-16 DIAGNOSIS — I10 Essential (primary) hypertension: Secondary | ICD-10-CM

## 2024-10-16 DIAGNOSIS — Z888 Allergy status to other drugs, medicaments and biological substances status: Secondary | ICD-10-CM | POA: Diagnosis not present

## 2024-10-16 DIAGNOSIS — Z5329 Procedure and treatment not carried out because of patient's decision for other reasons: Secondary | ICD-10-CM | POA: Diagnosis present

## 2024-10-16 DIAGNOSIS — E1122 Type 2 diabetes mellitus with diabetic chronic kidney disease: Secondary | ICD-10-CM | POA: Diagnosis present

## 2024-10-16 DIAGNOSIS — Z794 Long term (current) use of insulin: Secondary | ICD-10-CM | POA: Diagnosis not present

## 2024-10-16 HISTORY — PX: ESOPHAGOGASTRODUODENOSCOPY: SHX5428

## 2024-10-16 HISTORY — PX: COLONOSCOPY: SHX5424

## 2024-10-16 LAB — CBC
HCT: 18.8 % — ABNORMAL LOW (ref 39.0–52.0)
HCT: 25.6 % — ABNORMAL LOW (ref 39.0–52.0)
Hemoglobin: 6 g/dL — CL (ref 13.0–17.0)
Hemoglobin: 8.6 g/dL — ABNORMAL LOW (ref 13.0–17.0)
MCH: 26.2 pg (ref 26.0–34.0)
MCH: 27.9 pg (ref 26.0–34.0)
MCHC: 31.9 g/dL (ref 30.0–36.0)
MCHC: 33.6 g/dL (ref 30.0–36.0)
MCV: 82.1 fL (ref 80.0–100.0)
MCV: 83.1 fL (ref 80.0–100.0)
Platelets: 158 K/uL (ref 150–400)
Platelets: 178 K/uL (ref 150–400)
RBC: 2.29 MIL/uL — ABNORMAL LOW (ref 4.22–5.81)
RBC: 3.08 MIL/uL — ABNORMAL LOW (ref 4.22–5.81)
RDW: 16.1 % — ABNORMAL HIGH (ref 11.5–15.5)
RDW: 15.2 % (ref 11.5–15.5)
WBC: 6.5 K/uL (ref 4.0–10.5)
WBC: 6.8 K/uL (ref 4.0–10.5)
nRBC: 0 % (ref 0.0–0.2)
nRBC: 0 % (ref 0.0–0.2)

## 2024-10-16 LAB — BPAM RBC
Blood Product Expiration Date: 202512212359
Blood Product Expiration Date: 202512222359
Unit Type and Rh: 6200
Unit Type and Rh: 6200

## 2024-10-16 LAB — TYPE AND SCREEN
ABO/RH(D): A POS
Antibody Screen: NEGATIVE
Unit division: 0
Unit division: 0

## 2024-10-16 LAB — RENAL FUNCTION PANEL
Albumin: 3.4 g/dL — ABNORMAL LOW (ref 3.5–5.0)
Anion gap: 10 (ref 5–15)
BUN: 48 mg/dL — ABNORMAL HIGH (ref 8–23)
CO2: 29 mmol/L (ref 22–32)
Calcium: 6.6 mg/dL — ABNORMAL LOW (ref 8.9–10.3)
Chloride: 100 mmol/L (ref 98–111)
Creatinine, Ser: 8.85 mg/dL — ABNORMAL HIGH (ref 0.61–1.24)
GFR, Estimated: 6 mL/min — ABNORMAL LOW (ref >60)
Glucose, Bld: 120 mg/dL — ABNORMAL HIGH (ref 70–99)
Phosphorus: 4.3 mg/dL (ref 2.5–4.6)
Potassium: 4 mmol/L (ref 3.5–5.1)
Sodium: 139 mmol/L (ref 135–145)

## 2024-10-16 LAB — GLUCOSE, CAPILLARY
Glucose-Capillary: 128 mg/dL — ABNORMAL HIGH (ref 70–99)
Glucose-Capillary: 104 mg/dL — ABNORMAL HIGH (ref 70–99)
Glucose-Capillary: 164 mg/dL — ABNORMAL HIGH (ref 70–99)

## 2024-10-16 LAB — PREPARE RBC (CROSSMATCH)

## 2024-10-16 SURGERY — COLONOSCOPY
Anesthesia: Monitor Anesthesia Care

## 2024-10-16 MED ORDER — SODIUM CHLORIDE 0.9 % IV SOLN: INTRAVENOUS | Status: DC

## 2024-10-16 MED ORDER — LIDOCAINE HCL (CARDIAC) PF 100 MG/5ML IV SOSY
PREFILLED_SYRINGE | INTRAVENOUS | Status: DC | PRN
  Administered 2024-10-16: 12:00:00 60 mg via INTRAVENOUS

## 2024-10-16 MED ORDER — LACTATED RINGERS IV SOLN: INTRAVENOUS | Status: DC

## 2024-10-16 MED ORDER — SODIUM CHLORIDE 0.9% IV SOLUTION: Freq: Once | INTRAVENOUS | Status: AC

## 2024-10-16 MED ORDER — CARVEDILOL 12.5 MG PO TABS
12.5000 mg | ORAL_TABLET | Freq: Two times a day (BID) | ORAL | Status: DC
  Filled 2024-10-16 (×2): qty 1

## 2024-10-16 MED ORDER — STERILE WATER FOR IRRIGATION IR SOLN
Status: DC | PRN
  Administered 2024-10-16: 13:00:00 100 mL

## 2024-10-16 MED ORDER — HYDRALAZINE HCL 25 MG PO TABS
25.0000 mg | ORAL_TABLET | Freq: Two times a day (BID) | ORAL | Status: DC
  Administered 2024-10-16 – 2024-10-17 (×2): 25 mg via ORAL
  Filled 2024-10-16 (×4): qty 1

## 2024-10-16 MED ADMIN — Ephedrine Sulfate Prefilled Syringe 25 MG/5ML (5 MG/ML): 10 mg | INTRAVENOUS | @ 13:00:00 | NDC 51754425003

## 2024-10-16 MED ADMIN — PROPOFOL 200 MG/20ML IV EMUL: 50 mg | INTRAVENOUS | @ 12:00:00 | NDC 00069020910

## 2024-10-16 MED ADMIN — Ephedrine Sulfate Prefilled Syringe 25 MG/5ML (5 MG/ML): 5 mg | INTRAVENOUS | @ 13:00:00 | NDC 51754425003

## 2024-10-16 MED ADMIN — Propofol IV Emul 500 MG/50ML (10 MG/ML): 200 ug/kg/min | INTRAVENOUS | @ 12:00:00 | NDC 00069023420

## 2024-10-16 NOTE — Progress Notes (Signed)
 Left upper arm fistula thrill is palpable.

## 2024-10-16 NOTE — Op Note (Signed)
 [facility] Patient Name: Omar Parker Procedure Date: 10/16/2024 12:06 PM MRN: [id number] Date of Birth: 08/21/1946 Attending MD: Deatrice Dine , MD, [id number] CSN: [id number] Age: 78 Admit Type: Inpatient Procedure:                Upper GI endoscopy Indications:              Acute post hemorrhagic anemia Providers:                Deatrice Dine, MD, Jon LABOR. Gerome RN, RN,                            Daphne Mulch Technician, Pensions Consultant Referring MD:              Medicines:                Monitored Anesthesia Care Complications:            No immediate complications. Estimated Blood Loss:     Estimated blood loss was minimal. Procedure:                Pre-Anesthesia Assessment:                           - Prior to the procedure, a History and Physical                            was performed, and patient medications and                            allergies were reviewed. The patient's tolerance of                            previous anesthesia was also reviewed. The risks                            and benefits of the procedure and the sedation                            options and risks were discussed with the patient.                            All questions were answered, and informed consent                            was obtained. Prior Anticoagulants: The patient has                            taken no anticoagulant or antiplatelet agents                            except for NSAID medication. ASA Grade Assessment:                            III - A patient with severe systemic disease. After  reviewing the risks and benefits, the patient was                            deemed in satisfactory condition to undergo the                            procedure.                           After obtaining informed consent, the endoscope was                            passed under direct vision. Throughout the                            procedure, the  patient's blood pressure, pulse, and                            oxygen saturations were monitored continuously. The                            [id number] ([id number]) Upper was introduced through                            the mouth, and advanced to the second part of                            duodenum. The upper GI endoscopy was accomplished                            without difficulty. The patient tolerated the                            procedure well. Scope In: 1:21:25 PM Scope Out: 1:24:37 PM Total Procedure Duration: 0 hours 3 minutes 12 seconds  Findings:      The examined esophagus was normal.      The gastroesophageal flap valve was visualized endoscopically and       classified as Hill Grade IV (no fold, wide open lumen, hiatal hernia       present).      Mild inflammation characterized by erosions was found in the gastric       antrum. Biopsies were taken with a cold forceps for histology.      Diffuse mucosal variance characterized by discoloration was found in the       second portion of the duodenum. Biopsies were taken with a cold forceps       for histology.      There is no endoscopic evidence of bleeding in the entire examined       stomach. Impression:               - Normal esophagus.                           - Gastroesophageal flap valve classified as Hill  Grade IV (no fold, wide open lumen, hiatal hernia                            present).                           - Gastritis. Biopsied.                           - Mucosal variant in the duodenum. Biopsied. Moderate Sedation:      Per Anesthesia Care Recommendation:           Advance diet as tolerated                           Follow up path                           Avoid NSAIDs Procedure Code(s):        --- Professional ---                           [phone], Esophagogastroduodenoscopy, flexible,                            transoral; with biopsy, single or multiple Diagnosis Code(s):         --- Professional ---                           K44.9, Diaphragmatic hernia without obstruction or                            gangrene                           K29.70, Gastritis, unspecified, without bleeding                           K31.89, Other diseases of stomach and duodenum                           D62, Acute posthemorrhagic anemia CPT copyright 2022 American Medical Association. All rights reserved. The codes documented in this report are preliminary and upon coder review may  be revised to meet current compliance requirements. Deatrice Dine, MD Deatrice Dine, MD 10/16/2024 1:32:08 PM This report has been signed electronically. Number of Addenda: 0

## 2024-10-16 NOTE — Consult Note (Signed)
 [facility] Renal Consultation Note    Indication for Consultation:  Management of ESRD/hemodialysis; anemia, hypertension/volume and secondary hyperparathyroidism  HPI: Omar Parker is a 78 y.o. male with a PMH significant for DM, HTN, h/o CVA, HLD, GERD, and ESRD on HD TTS at [facility] who presented to [facility] ED via on 10/15/24 co dark red/maroon colored stools.  In the ED, Temp 98, Bp 158/66, HR 68, SpO2 98%.  Labs notable for Hgb 9.5 (dropped to 6), K 3.6, + hemoccult.  He is being admitted and we were consulted to provide dialysis during his hospitalization.   He denies any N/V abdominal pain, SOB, orthopnea, or PND.    Past Medical History:  Diagnosis Date   Anemia    IDA   Diabetes mellitus without complication (HCC)    GERD (gastroesophageal reflux disease)    High cholesterol    Hypertension    Stage 4 chronic kidney disease (HCC)    Past Surgical History:  Procedure Laterality Date   A/V SHUNT INTERVENTION Left 05/30/2024   Procedure: A/V SHUNT INTERVENTION;  Surgeon: Gretta Lonni PARAS, MD;  Location: [facility];  Service: Cardiovascular;  Laterality: Left;   AV FISTULA PLACEMENT Left 06/28/2020   Procedure: LEFT UPPER ARM ARTERIOVENOUS (AV) GORE-TEX GRAFT;  Surgeon: Harvey Carlin BRAVO, MD;  Location: [facility] OR;  Service: Vascular;  Laterality: Left;   COLONOSCOPY N/A 05/16/2017   Procedure: COLONOSCOPY;  Surgeon: Golda Claudis PENNER, MD;  [facility];  Service: Endoscopy;  Laterality: N/A;  7:30   COLONOSCOPY N/A 04/10/2020   Procedure: COLONOSCOPY;  Surgeon: Shaaron Lamar HERO, MD;  Location: [facility];  Service: Endoscopy;  Laterality: N/A;   ESOPHAGOGASTRODUODENOSCOPY N/A 04/10/2020   Procedure: ESOPHAGOGASTRODUODENOSCOPY (EGD);  Surgeon: Shaaron Lamar HERO, MD;  Location: [facility];  Service: Endoscopy;  Laterality: N/A;   HERNIA REPAIR     INSERTION OF DIALYSIS CATHETER  06/28/2020   Procedure: INSERTION OF DIALYSIS CATHETER;  Surgeon: Harvey Carlin BRAVO, MD;  Location: [facility] OR;  Service: Vascular;;   IR REMOVAL TUN CV CATH W/O FL  09/21/2020   POLYPECTOMY  04/10/2020   Procedure: POLYPECTOMY;  Surgeon: Shaaron Lamar HERO, MD;  Location: [facility];  Service: Endoscopy;;  cecal;   Family History:   Family History  Problem Relation Age of Onset   Cancer Mother    Cancer Sister    Chronic Renal Failure Neg Hx    Social History:  reports that he has never smoked. He has never used smokeless tobacco. He reports that he does not currently use alcohol. He reports that he does not currently use drugs after having used the following drugs: Marijuana. Allergies[1] Prior to Admission medications  Medication Sig Start Date End Date Taking? Authorizing Provider  acetaminophen  (TYLENOL ) 325 MG tablet Take 650 mg by mouth every 6 (six) hours as needed for moderate pain.    Yes [provider]  amLODipine  (NORVASC ) 10 MG tablet Take 10 mg by mouth daily. 08/07/24  Yes [provider]  aspirin  EC 81 MG tablet Take 1 tablet (81 mg total) by mouth daily. Swallow whole. 04/08/22  Yes Johnson, Clanford L, MD  carvedilol  (COREG ) 25 MG tablet Take 25 mg by mouth 2 (two) times daily with a meal.   Yes [provider]  fluticasone (FLONASE) 50 MCG/ACT nasal spray Place into both nostrils. 12/04/23  Yes [provider]  hydrALAZINE  (APRESOLINE ) 100 MG tablet Take 100 mg by mouth 2 (two)  times daily.   Yes [provider]  minoxidil  (LONITEN ) 2.5 MG tablet Take 2.5 mg by mouth daily. 09/22/24  Yes [provider]  pantoprazole  (PROTONIX ) 40 MG tablet Take 40 mg by mouth daily.    Yes [provider]  insulin  glargine (LANTUS ) 100 UNIT/ML injection Inject 0.06 mLs (6 Units total) into the skin at bedtime. Patient not taking: Reported on 10/15/2024 01/02/23   Ricky Fines, MD   Current Facility-Administered Medications  Medication Dose Route Frequency Provider Last Rate Last Admin   acetaminophen   (TYLENOL ) tablet 650 mg  650 mg Oral Q6H PRN Vicci, Clanford L, MD       Or   acetaminophen  (TYLENOL ) suppository 650 mg  650 mg Rectal Q6H PRN Johnson, Clanford L, MD       amLODipine  (NORVASC ) tablet 10 mg  10 mg Oral Daily Johnson, Clanford L, MD   10 mg at 10/15/24 1144   bisacodyl  (DULCOLAX) EC tablet 5 mg  5 mg Oral Daily PRN Johnson, Clanford L, MD       carvedilol  (COREG ) tablet 12.5 mg  12.5 mg Oral BID WC Johnson, Clanford L, MD       hydrALAZINE  (APRESOLINE ) injection 10 mg  10 mg Intravenous Q4H PRN Johnson, Clanford L, MD       hydrALAZINE  (APRESOLINE ) tablet 25 mg  25 mg Oral BID Johnson, Clanford L, MD       insulin  aspart (novoLOG ) injection 0-6 Units  0-6 Units Subcutaneous TID WC Johnson, Clanford L, MD       ondansetron  (ZOFRAN ) tablet 4 mg  4 mg Oral Q6H PRN Johnson, Clanford L, MD       Or   ondansetron  (ZOFRAN ) injection 4 mg  4 mg Intravenous Q6H PRN Johnson, Clanford L, MD       oxyCODONE  (Oxy IR/ROXICODONE ) immediate release tablet 5 mg  5 mg Oral Q6H PRN Johnson, Clanford L, MD       pantoprazole  (PROTONIX ) EC tablet 40 mg  40 mg Oral Daily Johnson, Clanford L, MD   40 mg at 10/15/24 1144   Labs: Basic Metabolic Panel: Recent Labs  Lab 10/15/24 0228 10/16/24 0451  NA 140 139  K 3.6 4.0  CL 98 100  CO2 32 29  GLUCOSE 145* 120*  BUN 36* 48*  CREATININE 6.81* 8.85*  CALCIUM  7.8* 6.6*  PHOS  --  4.3   Liver Function Tests: Recent Labs  Lab 10/15/24 0228 10/16/24 0451  AST 15  --   ALT <5  --   ALKPHOS 40  --   BILITOT 0.3  --   PROT 7.8  --   ALBUMIN  4.1 3.4*   No results for input(s): LIPASE, AMYLASE in the last 168 hours. No results for input(s): AMMONIA in the last 168 hours. CBC: Recent Labs  Lab 10/15/24 0228 10/15/24 0639 10/16/24 0451  WBC 6.6 8.5 6.5  HGB 9.5* 8.4* 6.0*  HCT 30.1* 26.5* 18.8*  MCV 82.9 83.3 82.1  PLT 231 219 158   Cardiac Enzymes: No results for input(s): CKTOTAL, CKMB, CKMBINDEX, TROPONINI in the  last 168 hours. CBG: Recent Labs  Lab 10/15/24 0837 10/15/24 1236 10/15/24 1621 10/15/24 1930 10/16/24 0748  GLUCAP 156* 127* 120* 123* 128*   Iron  Studies:  Recent Labs    10/15/24 0228  IRON  60  TIBC 200*  FERRITIN 1,082*   Studies/Results: No results found.  ROS: Pertinent items are noted in HPI. Physical Exam: Vitals:   10/15/24 2201 10/16/24 0202  10/16/24 0439 10/16/24 0806  BP: (!) 110/57 (!) 123/48 (!) 127/37 (!) 139/50  Pulse: 72 66 62 71  Resp: 19 18 17    Temp: 98.8 F (37.1 C) 99.1 F (37.3 C) 99.1 F (37.3 C) 98.6 F (37 C)  TempSrc: Oral Oral Oral Oral  SpO2: 98% 98% 98% 99%  Weight:      Height:          Weight change:  No intake or output data in the 24 hours ending 10/16/24 0913 BP (!) 139/50   Pulse 71   Temp 98.6 F (37 C) (Oral)   Resp 17   Ht 5' 11 (1.803 m)   Wt 79.6 kg   SpO2 99%   BMI 24.48 kg/m  General appearance: alert, cooperative, and no distress Head: Normocephalic, without obvious abnormality, atraumatic Resp: clear to auscultation bilaterally Cardio: regular rate and rhythm, S1, S2 normal, no murmur, click, rub or gallop GI: soft, non-tender; bowel sounds normal; no masses,  no organomegaly Extremities: extremities normal, atraumatic, no cyanosis or edema and LUE AVG +T/B Dialysis Access:  Dialysis Orders: Center: [facility]  on TTS . EDW 77.5 kg (post weight 79kg) HD Bath 2K/3Ca  Time 3:45 Heparin  1000 units bolus then 1000 units/hr. Access LUE AVG BFR 400 DFR 500    Micera 50 every 2 weeks (given 10/14/24) Venofer 50 mg IV weekly  Assessment/Plan:  ABLA with melena - GI consulted and plan for colonoscopy +/- EGD today.  ESRD -  normally on TTS schedule.  He does not want HD today as he has been npo and had bowel prep.  We discussed the risk of waiting until Saturday, however he is not interested in HD today.  We did discuss that he may need HD tomorrow following blood transfusion today and procedure.  Will  follow closely.  No heparin  with HD moving forward.   Hypertension/volume  -  stable  Anemia  -  as above, just received Micera on 10/14/24 and his Hgb was 10.5 at that time  Metabolic bone disease - continue with home meds once able to take po    Nutrition - npo for now per GI  Fairy RONAL Sellar, MD [facility] 10/16/2024, 9:13 AM           [1]  Allergies Allergen Reactions   Ace Inhibitors Swelling   Atorvastatin      Sweating and pt reports interferes w/diabetes &bp

## 2024-10-16 NOTE — Anesthesia Preprocedure Evaluation (Signed)
 Anesthesia Evaluation  Patient identified by MRN, date of birth, ID band Patient awake    Reviewed: Allergy & Precautions, H&P , NPO status , Patient's Chart, lab work & pertinent test results, reviewed documented beta blocker date and time   Airway Mallampati: II  TM Distance: >3 FB Neck ROM: full    Dental no notable dental hx.    Pulmonary neg pulmonary ROS   Pulmonary exam normal breath sounds clear to auscultation       Cardiovascular Exercise Tolerance: Good hypertension,  Rhythm:regular Rate:Normal     Neuro/Psych TIACVA  negative psych ROS   GI/Hepatic Neg liver ROS,GERD  ,,  Endo/Other  diabetes    Renal/GU CRF  negative genitourinary   Musculoskeletal   Abdominal   Peds  Hematology  (+) Blood dyscrasia, anemia   Anesthesia Other Findings   Reproductive/Obstetrics negative OB ROS                              Anesthesia Physical Anesthesia Plan  ASA: 4 and emergent  Anesthesia Plan: MAC   Post-op Pain Management:    Induction:   PONV Risk Score and Plan: Propofol  infusion  Airway Management Planned:   Additional Equipment:   Intra-op Plan:   Post-operative Plan:   Informed Consent: I have reviewed the patients History and Physical, chart, labs and discussed the procedure including the risks, benefits and alternatives for the proposed anesthesia with the patient or authorized representative who has indicated his/her understanding and acceptance.     Dental Advisory Given  Plan Discussed with: CRNA  Anesthesia Plan Comments:         Anesthesia Quick Evaluation

## 2024-10-16 NOTE — Telephone Encounter (Signed)
 Patient had EGD/colonoscopy today while inpatient.  His colon prep was fair. He will need colonoscopy in one year with Dr. Cinderella, please put in recall.  He will need follow up office visit in few weeks with [facility] or Dr. Cinderella.

## 2024-10-16 NOTE — Telephone Encounter (Signed)
1 yr TCS noted in recall

## 2024-10-16 NOTE — Plan of Care (Signed)
   Problem: Education: Goal: Ability to describe self-care measures that may prevent or decrease complications (Diabetes Survival Skills Education) will improve Outcome: Progressing Goal: Individualized Educational Video(s) Outcome: Progressing   Problem: Coping: Goal: Ability to adjust to condition or change in health will improve Outcome: Progressing

## 2024-10-16 NOTE — Transfer of Care (Signed)
 Immediate Anesthesia Transfer of Care Note  Patient: Omar Parker  Procedure(s) Performed: COLONOSCOPY EGD (ESOPHAGOGASTRODUODENOSCOPY)  Patient Location: PACU  Anesthesia Type:MAC  Level of Consciousness: awake, alert , oriented, and patient cooperative  Airway & Oxygen Therapy: Patient Spontanous Breathing  Post-op Assessment: Report given to RN, Post -op Vital signs reviewed and stable, and Patient moving all extremities X 4  Post vital signs: Reviewed and stable  Last Vitals:  Vitals Value Taken Time  BP 112/41 10/16/24 13:31  Temp 36.5 C 10/16/24 13:30  Pulse 61 10/16/24 13:37  Resp 16 10/16/24 13:37  SpO2 100 % 10/16/24 13:37  Vitals shown include unfiled device data.  Last Pain:  Vitals:   10/16/24 1330  TempSrc:   PainSc: 0-No pain         Complications: No notable events documented.

## 2024-10-16 NOTE — Progress Notes (Signed)
 Abdomen soft patient has no complaints of pain.

## 2024-10-16 NOTE — Interval H&P Note (Signed)
 History and Physical Interval Note:  10/16/2024 12:11 PM  Omar Parker  has presented today for surgery, with the diagnosis of rectal bleeding.  The various methods of treatment have been discussed with the patient and family. After consideration of risks, benefits and other options for treatment, the patient has consented to  Procedures with comments: COLONOSCOPY (N/A) EGD (ESOPHAGOGASTRODUODENOSCOPY) (N/A) - EGD only if no source of bleedign in LGI tract as a surgical intervention.  The patient's history has been reviewed, patient examined, no change in status, stable for surgery.  I have reviewed the patient's chart and labs.  Questions were answered to the patient's satisfaction.    I thoroughly discussed with the patient the procedure, including the risks involved. Patient understands what the procedure involves including the benefits and any risks. Patient understands alternatives to the proposed procedure. Risks including (but not limited to) bleeding, tearing of the lining (perforation), rupture of adjacent organs, problems with heart and lung function, infection, and medication reactions. A small percentage of complications may require surgery, hospitalization, repeat endoscopic procedure, and/or transfusion.  Patient understood and agreed.    Deatrice FALCON [name]

## 2024-10-16 NOTE — Progress Notes (Signed)
 Patient completed x2 units of PRBCs without difficulty, or signs of adverse reactions.

## 2024-10-16 NOTE — Brief Op Note (Signed)
 Patient underwent Egd/Colonoscopy  under propofol  sedation.  Tolerated the procedure adequately.   FINDINGS:  Colonoscopy   - Preparation of the colon was fair. - Diverticulosis in the entire examined colon. - Diverticulosis in the sigmoid colon , 30 cm from anal verga . There was evidence of recent bleeding from the diverticular opening with ulcerated mucosa. Clip ( MR conditional) was placed. Clip manufacturer: [employer]. - Two 3 to 5 mm polyps in the transverse colon and in the ascending colon, removed with a cold snare. Resected and retrieved. - Old Blood in the entire examined colon. - Non- bleeding internal hemorrhoids.  proceed with upper endoscopy as patient endorses heavy NSAID use ( BC powder) to exclude Peptic ulcer disease  EGD  - Normal esophagus. - Gastroesophageal flap valve classified as Hill Grade IV ( no fold, wide open lumen, hiatal hernia present) . - Gastritis. Biopsied. - Mucosal variant in the duodenum. Biopsied.   RECOMMENDATIONS -Advance diet as tolerated  -Avoid NSAID  -Repeat Colonoscopy in 1 year with extended bowel prep given fair prep and to re- examine sigmoid colon diverticuli as it has ulceration  -Follow up path   Melis Trochez Faizan Amee Boothe, MD Gastroenterology and Hepatology [facility]astroenterology

## 2024-10-16 NOTE — Progress Notes (Signed)
 PROGRESS NOTE   Omar Parker  FMW:[id number] DOB: 10-Apr-1946 DOA: 10/15/2024 PCP: Pcp, No   Chief Complaint  Patient presents with   Rectal Bleeding   Level of care: Telemetry  Brief Admission History:  78 year old male with ESRD on dialysis TTS, hypertension, history of stroke, chronically on aspirin , hyperlipidemia, GERD, type 2 diabetes mellitus, presented to the ED earlier this morning complaining of bright red blood per rectum and tested Hemoccult positive with hemoglobin down from baseline of about 10-8.4.  He is not on full anticoagulation.  He has some fatigue but no dizziness or weakness or falling.  Admission was initially requested in the ED however patient declined and left AGAINST MEDICAL ADVICE.  He has subsequently returned to the ED after having another bloody bowel movement at home.  He is now agreeable to hospital admission.   Assessment and Plan:  BRBPR ABLA Lower GI bleeding -- Hg down to 6.0 from 9.5 yesterday, 2 additional units PRBC ordered  -- hemoccult positive stools -- Typed and crossed -- Hold daily aspirin  -- Follow CBC closely -- GI consultation requested -- EGD: normal esophagus, gastritis, mucosal variant in duodenum biopsied.  -- colonoscopy: prep was fair, diverticulosis in the sigmoid colon, 30 cm from the anal verge there was evidence of recent bleeding from diverticular opening with ulcerated mucosa clipped in place, two 3 to 5 mm polyps in the transverse colon and in the ascending colon removed with a cold snare, resected and retrieved old blood in the entire examined colon nonbleeding internal hemorrhoids. --GI recommendations are to advance diet as tolerated avoiding NSAIDs, repeat colonoscopy in 1 year with extended bowel prep and follow-up path   Essential hypertension -- elevated BPs likely from not taking home meds -- resume home BP meds and monitor -- IV hydralazine  ordered PRN for elevated BP readings   GERD -- resume daily  pantoprazole  therapy   ESRD on HD  -- reports HD on TTS and not missed any recent treatments -- consult to nephrology inpatient    Hyperlipidemia -- resume home pravastatin  therapy    DVT prophylaxis: SCDs Code Status: Full  Family Communication:  Disposition:    Consultants:  GI  Procedures:  EGD 12/18  Colonoscopy 12/18 Antimicrobials:    Subjective: Pt reported ongoing bloody stool, some difficulty tolerating GI prep.   Objective: Vitals:   10/16/24 1345 10/16/24 1353 10/16/24 1408 10/16/24 1435  BP: (!) 117/43  (!) 151/55 (!) 152/50  Pulse: (!) 57 (!) 59 61 (!) 57  Resp: 16 15 17 18   Temp:  97.6 F (36.4 C) 97.8 F (36.6 C) (!) 97.4 F (36.3 C)  TempSrc:      SpO2: 100% 100% 99%   Weight:      Height:        Intake/Output Summary (Last 24 hours) at 10/16/2024 1442 Last data filed at 10/16/2024 1325 Gross per 24 hour  Intake 698 ml  Output --  Net 698 ml   Filed Weights   10/15/24 0621  Weight: 79.6 kg   Examination:  General exam: Appears calm and comfortable  Respiratory system: Clear to auscultation. Respiratory effort normal. Cardiovascular system: normal S1 & S2 heard. No JVD, murmurs, rubs, gallops or clicks. No pedal edema. Gastrointestinal system: Abdomen is nondistended, soft and nontender. No organomegaly or masses felt. Normal bowel sounds heard. Central nervous system: Alert and oriented. No focal neurological deficits. Extremities: Symmetric 5 x 5 power. Skin: No rashes, lesions or ulcers. Psychiatry: Judgement and insight appear  normal. Mood & affect appropriate.   Data Reviewed: I have personally reviewed following labs and imaging studies  CBC: Recent Labs  Lab 10/15/24 0228 10/15/24 0639 10/16/24 0451  WBC 6.6 8.5 6.5  HGB 9.5* 8.4* 6.0*  HCT 30.1* 26.5* 18.8*  MCV 82.9 83.3 82.1  PLT 231 219 158    Basic Metabolic Panel: Recent Labs  Lab 10/15/24 0228 10/16/24 0451  NA 140 139  K 3.6 4.0  CL 98 100  CO2 32 29   GLUCOSE 145* 120*  BUN 36* 48*  CREATININE 6.81* 8.85*  CALCIUM  7.8* 6.6*  PHOS  --  4.3    CBG: Recent Labs  Lab 10/15/24 0837 10/15/24 1236 10/15/24 1621 10/15/24 1930 10/16/24 0748  GLUCAP 156* 127* 120* 123* 128*    No results found for this or any previous visit (from the past 240 hours).   Radiology Studies: No results found.  Scheduled Meds:  amLODipine   10 mg Oral Daily   carvedilol   12.5 mg Oral BID WC   hydrALAZINE   25 mg Oral BID   insulin  aspart  0-6 Units Subcutaneous TID WC   pantoprazole   40 mg Oral Daily   Continuous Infusions:  sodium chloride        LOS: 0 days   Time spent: 56 mins  Aleigha Gilani Vicci, MD How to contact the [facility] Attending or Consulting provider 7A - 7P or covering provider during after hours 7P -7A, for this patient?  Check the care team in [facility] and look for a) attending/consulting [facility] provider listed and b) the TR[facility] team listed Log into [email or web address] to find provider on call.  Locate the [facility] provider you are looking for under [facility] and page to a number that you can be directly reached. If you still have difficulty reaching the provider, please page the [email or web address] (Director on Call) for the Hospitalists listed on amion for assistance.  10/16/2024, 2:42 PM

## 2024-10-16 NOTE — Op Note (Addendum)
 [facility] Patient Name: Omar Parker Procedure Date: 10/16/2024 12:11 PM MRN: [id number] Date of Birth: 1946-10-05 Attending MD: Deatrice Dine , MD, [id number] CSN: [id number] Age: 78 Admit Type: Inpatient Procedure:                Colonoscopy Indications:              Hematochezia, Acute post hemorrhagic anemia Providers:                Deatrice Dine, MD, Jon LABOR. Gerome RN, RN,                            Daphne Mulch Technician, Pensions Consultant Referring MD:              Medicines:                Monitored Anesthesia Care Complications:            No immediate complications. Estimated Blood Loss:     Estimated blood loss: none. Procedure:                Pre-Anesthesia Assessment:                           - Prior to the procedure, a History and Physical                            was performed, and patient medications and                            allergies were reviewed. The patient's tolerance of                            previous anesthesia was also reviewed. The risks                            and benefits of the procedure and the sedation                            options and risks were discussed with the patient.                            All questions were answered, and informed consent                            was obtained. Prior Anticoagulants: The patient has                            taken no anticoagulant or antiplatelet agents                            except for NSAID medication. ASA Grade Assessment:                            III - A patient with severe systemic disease. After  reviewing the risks and benefits, the patient was                            deemed in satisfactory condition to undergo the                            procedure.                           After obtaining informed consent, the colonoscope                            was passed under direct vision. Throughout the                            procedure, the  patient's blood pressure, pulse, and                            oxygen saturations were monitored continuously. The                            [id number] ([id number]) Colon was introduced through                            the anus and advanced to the the cecum, identified                            by appendiceal orifice and ileocecal valve. The                            colonoscopy was performed without difficulty. The                            patient tolerated the procedure well. The quality                            of the bowel preparation was fair. Scope In: 12:38:21 PM Scope Out: 1:14:31 PM Scope Withdrawal Time: 0 hours 25 minutes 6 seconds  Total Procedure Duration: 0 hours 36 minutes 10 seconds  Findings:      The perianal and digital rectal examinations were normal.      Scattered medium-mouthed diverticula were found in the entire colon.      A single medium-mouthed diverticulum was found in the sigmoid colon.       There was evidence of recent bleeding from the diverticular opening. For       hemostasis, one hemostatic clip was successfully placed (MR       conditional). Clip manufacturer: [employer]. There was no       bleeding at the end of the procedure.      Two sessile polyps were found in the transverse colon and ascending       colon. The polyps were 3 to 5 mm in size. These polyps were removed with       a cold snare. Resection and retrieval were complete.      Hematin (altered blood/coffee-ground-like material) was found in the  entire colon.      Non-bleeding internal hemorrhoids were found during retroflexion. The       hemorrhoids were medium-sized.      Unable to intubate terminal ileum due to tight angulation Impression:               - Preparation of the colon was fair.                           - Diverticulosis in the entire examined colon.                           - Diverticulosis in the sigmoid colon , 30 cm from                             anal verge . There was evidence of recent bleeding                            from the diverticular opening with ulcerated                            mucosa. Clip (MR conditional) was placed. Clip                            manufacturer: [employer].                           - Two 3 to 5 mm polyps in the transverse colon and                            in the ascending colon, removed with a cold snare.                            Resected and retrieved.                           - Old Blood in the entire examined colon.                           - Non-bleeding internal hemorrhoids. Moderate Sedation:      Per Anesthesia Care Recommendation:           proceed with upper endoscopy as patient endorses                            heavy NSAID use ( BC powder) to exclude Peptic                            ulcer disease                           Avoid NSAID                           Repeat Colonoscopy in 1 year with extended bowel  prep given fair prep and to re-examine sigmoid                            colon diverticuli as it has ulceration                           Follow up path Procedure Code(s):        --- Professional ---                           [phone], 59, Colonoscopy, flexible; with control of                            bleeding, any method                           45385, Colonoscopy, flexible; with removal of                            tumor(s), polyp(s), or other lesion(s) by snare                            technique Diagnosis Code(s):        --- Professional ---                           K64.8, Other hemorrhoids                           D12.3, Benign neoplasm of transverse colon (hepatic                            flexure or splenic flexure)                           D12.2, Benign neoplasm of ascending colon                           K92.2, Gastrointestinal hemorrhage, unspecified                           K92.1, Melena (includes Hematochezia)                            D62, Acute posthemorrhagic anemia                           K57.30, Diverticulosis of large intestine without                            perforation or abscess without bleeding CPT copyright 2022 American Medical Association. All rights reserved. The codes documented in this report are preliminary and upon coder review may  be revised to meet current compliance requirements. Deatrice Dine, MD Deatrice Dine, MD 10/16/2024 1:28:46 PM This report has been signed electronically. Number of Addenda: 0

## 2024-10-17 ENCOUNTER — Encounter (HOSPITAL_COMMUNITY): Payer: Self-pay | Admitting: Gastroenterology

## 2024-10-17 DIAGNOSIS — N186 End stage renal disease: Secondary | ICD-10-CM

## 2024-10-17 DIAGNOSIS — I10 Essential (primary) hypertension: Secondary | ICD-10-CM

## 2024-10-17 LAB — TYPE AND SCREEN
ABO/RH(D): A POS
Antibody Screen: NEGATIVE
Unit division: 0
Unit division: 0

## 2024-10-17 LAB — GLUCOSE, CAPILLARY
Glucose-Capillary: 107 mg/dL — ABNORMAL HIGH (ref 70–99)
Glucose-Capillary: 150 mg/dL — ABNORMAL HIGH (ref 70–99)

## 2024-10-17 LAB — RENAL FUNCTION PANEL
Albumin: 3.3 g/dL — ABNORMAL LOW (ref 3.5–5.0)
Anion gap: 13 (ref 5–15)
BUN: 58 mg/dL — ABNORMAL HIGH (ref 8–23)
CO2: 26 mmol/L (ref 22–32)
Calcium: 6.3 mg/dL — CL (ref 8.9–10.3)
Chloride: 100 mmol/L (ref 98–111)
Creatinine, Ser: 10.3 mg/dL — ABNORMAL HIGH (ref 0.61–1.24)
GFR, Estimated: 5 mL/min — ABNORMAL LOW
Glucose, Bld: 89 mg/dL (ref 70–99)
Phosphorus: 5.4 mg/dL — ABNORMAL HIGH (ref 2.5–4.6)
Potassium: 4.1 mmol/L (ref 3.5–5.1)
Sodium: 138 mmol/L (ref 135–145)

## 2024-10-17 LAB — BPAM RBC
Blood Product Expiration Date: 202512212359
Blood Product Expiration Date: 202512222359
ISSUE DATE / TIME: 202512180937
ISSUE DATE / TIME: 202512181416
Unit Type and Rh: 6200
Unit Type and Rh: 6200

## 2024-10-17 LAB — CBC
HCT: 24.3 % — ABNORMAL LOW (ref 39.0–52.0)
Hemoglobin: 8 g/dL — ABNORMAL LOW (ref 13.0–17.0)
MCH: 27.2 pg (ref 26.0–34.0)
MCHC: 32.9 g/dL (ref 30.0–36.0)
MCV: 82.7 fL (ref 80.0–100.0)
Platelets: 171 K/uL (ref 150–400)
RBC: 2.94 MIL/uL — ABNORMAL LOW (ref 4.22–5.81)
RDW: 15.2 % (ref 11.5–15.5)
WBC: 6.3 K/uL (ref 4.0–10.5)
nRBC: 0 % (ref 0.0–0.2)

## 2024-10-17 MED ORDER — CHLORHEXIDINE GLUCONATE CLOTH 2 % EX PADS: 6.0000 | MEDICATED_PAD | Freq: Every day | CUTANEOUS | Status: DC

## 2024-10-17 MED ORDER — SODIUM CHLORIDE 0.9 % IV SOLN: 4.0000 g | Freq: Once | INTRAVENOUS | Status: DC

## 2024-10-17 MED ORDER — CALCIUM GLUCONATE-NACL 2-0.675 GM/100ML-% IV SOLN
2.0000 g | Freq: Once | INTRAVENOUS | Status: DC
  Filled 2024-10-17: qty 100

## 2024-10-17 MED ORDER — CALCIUM CARBONATE ANTACID 500 MG PO CHEW
1.0000 | CHEWABLE_TABLET | Freq: Two times a day (BID) | ORAL | Status: DC
  Administered 2024-10-17: 200 mg via ORAL
  Filled 2024-10-17: qty 1

## 2024-10-17 MED ORDER — CALCIUM CARBONATE ANTACID 500 MG PO CHEW: 1.0000 | CHEWABLE_TABLET | Freq: Two times a day (BID) | ORAL | Status: AC

## 2024-10-17 NOTE — Discharge Summary (Signed)
 Physician Discharge Summary  Omar Parker FMW:[id number] DOB: [birth year] DOA: 10/15/2024   Admit date: 10/15/2024 Discharge date: 10/17/2024  Admitted From:  Home  Disposition: Home   Recommendations for Outpatient Follow-up:  Follow up with PCP in 1 weeks Go to outpatient dialysis tomorrow as scheduled  Please obtain CBC and BMP tomorrow with dialysis   Discharge Condition: STABLE   CODE STATUS: FULL DIET: renal    Brief Hospitalization Summary: Please see all hospital notes, images, labs for full details of the hospitalization. Admission provider HPI: 78 year old male with ESRD on dialysis TTS, hypertension, history of stroke, chronically on aspirin , hyperlipidemia, GERD, type 2 diabetes mellitus, presented to the ED earlier this morning complaining of bright red blood per rectum and tested Hemoccult positive with hemoglobin down from baseline of about 10-8.4.  He is not on full anticoagulation.  He has some fatigue but no dizziness or weakness or falling.  Admission was initially requested in the ED however patient declined and left AGAINST MEDICAL ADVICE.  He has subsequently returned to the ED after having another bloody bowel movement at home.  He is now agreeable to hospital admission.  Hospital Course by listed problems addressed   BRBPR ABLA Lower GI bleeding -- Hg down to 6.0 from 9.5 yesterday, 2 additional units PRBC ordered  -- hemoccult positive stools -- Typed and crossed -- Hold daily aspirin  -- Follow CBC closely -- GI consultation requested -- EGD: normal esophagus, gastritis, mucosal variant in duodenum biopsied.  -- colonoscopy: prep was fair, diverticulosis in the sigmoid colon, 30 cm from the anal verge there was evidence of recent bleeding from diverticular opening with ulcerated mucosa clipped in place, two 3 to 5 mm polyps in the transverse colon and in the ascending colon removed with a cold snare, resected and retrieved old blood in the entire examined  colon nonbleeding internal hemorrhoids. --GI recommendations are to advance diet as tolerated avoiding NSAIDs, repeat colonoscopy in 1 year with extended bowel prep and follow-up path   Essential hypertension -- elevated BPs likely from not taking home meds -- resume home BP meds and monitor -- IV hydralazine  ordered PRN for elevated BP readings   Hypocalcemia -- pt refused the IV calcium  ordered -- tums ordered BID  -- pt says he will get his labs rechecked with outpatient dialysis tomorrow   GERD -- resume daily pantoprazole  therapy   ESRD on HD  -- reports HD on TTS and not missed any recent treatments -- consult to nephrology inpatient    Hyperlipidemia -- resume home pravastatin  therapy     Discharge Diagnoses:  Principal Problem:   BRBPR (bright red blood per rectum) Active Problems:   Hypertension   High cholesterol   Rectal bleeding   Acute blood loss anemia   Anemia in chronic kidney disease (CKD)   Type 2 diabetes mellitus with renal complication (HCC)   Aspirin  long-term use   ESRD on dialysis (HCC)   GERD (gastroesophageal reflux disease)   History of CVA (cerebrovascular accident)   Mild aortic regurgitation   Severe sinus bradycardia   Melanotic stools   Gastritis and gastroduodenitis   Adenomatous polyp of ascending colon   Discharge Instructions:  Allergies as of 10/17/2024       Reactions   Ace Inhibitors Swelling   Atorvastatin     Sweating and pt reports interferes w/diabetes &bp        Medication List     PAUSE taking these medications    aspirin  EC 81  MG tablet Wait to take this until your doctor or other care provider tells you to start again. Take 1 tablet (81 mg total) by mouth daily. Swallow whole.       STOP taking these medications    insulin  glargine 100 UNIT/ML injection Commonly known as: LANTUS        TAKE these medications    acetaminophen  325 MG tablet Commonly known as: TYLENOL  Take 650 mg by mouth every  6 (six) hours as needed for moderate pain.   amLODipine  10 MG tablet Commonly known as: NORVASC  Take 10 mg by mouth daily.   calcium  carbonate 500 MG chewable tablet Commonly known as: TUMS - dosed in mg elemental calcium  Chew 1 tablet (200 mg of elemental calcium  total) by mouth 2 (two) times daily with a meal.   carvedilol  25 MG tablet Commonly known as: COREG  Take 25 mg by mouth 2 (two) times daily with a meal.   fluticasone 50 MCG/ACT nasal spray Commonly known as: FLONASE Place into both nostrils.   hydrALAZINE  100 MG tablet Commonly known as: APRESOLINE  Take 100 mg by mouth 2 (two) times daily.   minoxidil  2.5 MG tablet Commonly known as: LONITEN  Take 2.5 mg by mouth daily.   pantoprazole  40 MG tablet Commonly known as: PROTONIX  Take 40 mg by mouth daily.        Follow-up Information     [facility]. Schedule an appointment as soon as possible for a visit in 3 week(s).   Why: Hospital Follow Up Contact information: [address] [place]  [phone]        primary care provider. Schedule an appointment as soon as possible for a visit in 1 week(s).   Why: Hospital Follow Up               Allergies[1] Allergies as of 10/17/2024       Reactions   Ace Inhibitors Swelling   Atorvastatin     Sweating and pt reports interferes w/diabetes &bp        Medication List     PAUSE taking these medications    aspirin  EC 81 MG tablet Wait to take this until your doctor or other care provider tells you to start again. Take 1 tablet (81 mg total) by mouth daily. Swallow whole.       STOP taking these medications    insulin  glargine 100 UNIT/ML injection Commonly known as: LANTUS        TAKE these medications    acetaminophen  325 MG tablet Commonly known as: TYLENOL  Take 650 mg by mouth every 6 (six) hours as needed for moderate pain.   amLODipine  10 MG tablet Commonly known as:  NORVASC  Take 10 mg by mouth daily.   calcium  carbonate 500 MG chewable tablet Commonly known as: TUMS - dosed in mg elemental calcium  Chew 1 tablet (200 mg of elemental calcium  total) by mouth 2 (two) times daily with a meal.   carvedilol  25 MG tablet Commonly known as: COREG  Take 25 mg by mouth 2 (two) times daily with a meal.   fluticasone 50 MCG/ACT nasal spray Commonly known as: FLONASE Place into both nostrils.   hydrALAZINE  100 MG tablet Commonly known as: APRESOLINE  Take 100 mg by mouth 2 (two) times daily.   minoxidil  2.5 MG tablet Commonly known as: LONITEN  Take 2.5 mg by mouth daily.   pantoprazole  40 MG tablet Commonly known as: PROTONIX  Take 40 mg by mouth daily.  Procedures/Studies: No results found.   Subjective: Pt reporting that he is feeling well. He has refused IV calcium  and dialysis today, wants to go home.  Reports he had a bowel movement this morning and no blood was seen.   Discharge Exam: Vitals:   10/17/24 0734 10/17/24 0847  BP:  (!) 165/58  Pulse: 60   Resp:    Temp:    SpO2:     Vitals:   10/17/24 0510 10/17/24 0700 10/17/24 0734 10/17/24 0847  BP: 132/72   (!) 165/58  Pulse: 63  60   Resp:  14    Temp: 99.4 F (37.4 C)     TempSrc: Oral     SpO2: 96%     Weight:      Height:       General: Pt is alert, awake, not in acute distress Cardiovascular: normal S1/S2 +, no rubs, no gallops Respiratory: CTA bilaterally, no wheezing, no rhonchi Abdominal: Soft, NT, ND, bowel sounds + Extremities: no edema, no cyanosis   The results of significant diagnostics from this hospitalization (including imaging, microbiology, ancillary and laboratory) are listed below for reference.     Microbiology: No results found for this or any previous visit (from the past 240 hours).   Labs: BNP (last 3 results) No results for input(s): BNP in the last 8760 hours. Basic Metabolic Panel: Recent Labs  Lab 10/15/24 0228 10/16/24 0451  10/17/24 0424  NA 140 139 138  K 3.6 4.0 4.1  CL 98 100 100  CO2 32 29 26  GLUCOSE 145* 120* 89  BUN 36* 48* 58*  CREATININE 6.81* 8.85* 10.30*  CALCIUM  7.8* 6.6* 6.3*  PHOS  --  4.3 5.4*   Liver Function Tests: Recent Labs  Lab 10/15/24 0228 10/16/24 0451 10/17/24 0424  AST 15  --   --   ALT <5  --   --   ALKPHOS 40  --   --   BILITOT 0.3  --   --   PROT 7.8  --   --   ALBUMIN  4.1 3.4* 3.3*   No results for input(s): LIPASE, AMYLASE in the last 168 hours. No results for input(s): AMMONIA in the last 168 hours. CBC: Recent Labs  Lab 10/15/24 0228 10/15/24 0639 10/16/24 0451 10/16/24 1855 10/17/24 0424  WBC 6.6 8.5 6.5 6.8 6.3  HGB 9.5* 8.4* 6.0* 8.6* 8.0*  HCT 30.1* 26.5* 18.8* 25.6* 24.3*  MCV 82.9 83.3 82.1 83.1 82.7  PLT 231 219 158 178 171   Cardiac Enzymes: No results for input(s): CKTOTAL, CKMB, CKMBINDEX, TROPONINI in the last 168 hours. BNP: Invalid input(s): POCBNP CBG: Recent Labs  Lab 10/15/24 1930 10/16/24 0748 10/16/24 1651 10/16/24 2059 10/17/24 0720  GLUCAP 123* 128* 104* 164* 107*   D-Dimer No results for input(s): DDIMER in the last 72 hours. Hgb A1c Recent Labs    10/15/24 0751  HGBA1C 5.8*   Lipid Profile No results for input(s): CHOL, HDL, LDLCALC, TRIG, CHOLHDL, LDLDIRECT in the last 72 hours. Thyroid  function studies No results for input(s): TSH, T4TOTAL, T3FREE, THYROIDAB in the last 72 hours.  Invalid input(s): FREET3 Anemia work up Recent Labs    10/15/24 0228  VITAMINB12 438  FOLATE 6.7  FERRITIN 1,082*  TIBC 200*  IRON  60  RETICCTPCT 1.3   Urinalysis    Component Value Date/Time   COLORURINE STRAW (A) 12/31/2022 2001   APPEARANCEUR CLEAR 12/31/2022 2001   LABSPEC 1.012 12/31/2022 2001   PHURINE 8.0 12/31/2022 2001  GLUCOSEU >=500 (A) 12/31/2022 2001   HGBUR NEGATIVE 12/31/2022 2001   BILIRUBINUR NEGATIVE 12/31/2022 2001   KETONESUR NEGATIVE 12/31/2022 2001    PROTEINUR >=300 (A) 12/31/2022 2001   NITRITE NEGATIVE 12/31/2022 2001   LEUKOCYTESUR NEGATIVE 12/31/2022 2001   Sepsis Labs Recent Labs  Lab 10/15/24 0639 10/16/24 0451 10/16/24 1855 10/17/24 0424  WBC 8.5 6.5 6.8 6.3   Microbiology No results found for this or any previous visit (from the past 240 hours).  Time coordinating discharge: 31 mins   SIGNED:  Afton Louder, MD  [facility] 10/17/2024, 10:24 AM How to contact the [facility] Attending or Consulting provider 7A - 7P or covering provider during after hours 7P -7A, for this patient?  Check the care team in [facility] and look for a) attending/consulting [facility] provider listed and b) the [facility] team listed Log into [email or web address] and use [place]'s universal password to access. If you do not have the password, please contact the hospital operator. Locate the [facility] provider you are looking for under [facility] and page to a number that you can be directly reached. If you still have difficulty reaching the provider, please page the [facility] (Director on Call) for the Hospitalists listed on amion for assistance.     [1]  Allergies Allergen Reactions   Ace Inhibitors Swelling   Atorvastatin      Sweating and pt reports interferes w/diabetes &bp

## 2024-10-17 NOTE — Discharge Instructions (Signed)
 IMPORTANT INFORMATION: PAY CLOSE ATTENTION   PHYSICIAN DISCHARGE INSTRUCTIONS  Follow with Primary care provider  Pcp, No  and other consultants as instructed by your Hospitalist Physician  SEEK MEDICAL CARE OR RETURN TO EMERGENCY ROOM IF SYMPTOMS COME BACK, WORSEN OR NEW PROBLEM DEVELOPS   Please note: You were cared for by a hospitalist during your hospital stay. Every effort will be made to forward records to your primary care provider.  You can request that your primary care provider send for your hospital records if they have not received them.  Once you are discharged, your primary care physician will handle any further medical issues. Please note that NO REFILLS for any discharge medications will be authorized once you are discharged, as it is imperative that you return to your primary care physician (or establish a relationship with a primary care physician if you do not have one) for your post hospital discharge needs so that they can reassess your need for medications and monitor your lab values.  Please get a complete blood count and chemistry panel checked by your Primary MD at your next visit, and again as instructed by your Primary MD.  Get Medicines reviewed and adjusted: Please take all your medications with you for your next visit with your Primary MD  Laboratory/radiological data: Please request your Primary MD to go over all hospital tests and procedure/radiological results at the follow up, please ask your primary care provider to get all Hospital records sent to his/her office.  In some cases, they will be blood work, cultures and biopsy results pending at the time of your discharge. Please request that your primary care provider follow up on these results.  If you are diabetic, please bring your blood sugar readings with you to your follow up appointment with primary care.    Please call and make your follow up appointments as soon as possible.    Also Note the  following: If you experience worsening of your admission symptoms, develop shortness of breath, life threatening emergency, suicidal or homicidal thoughts you must seek medical attention immediately by calling 911 or calling your MD immediately  if symptoms less severe.  You must read complete instructions/literature along with all the possible adverse reactions/side effects for all the Medicines you take and that have been prescribed to you. Take any new Medicines after you have completely understood and accpet all the possible adverse reactions/side effects.   Do not drive when taking Pain medications or sleeping medications (Benzodiazepines)  Do not take more than prescribed Pain, Sleep and Anxiety Medications. It is not advisable to combine anxiety,sleep and pain medications without talking with your primary care practitioner  Special Instructions: If you have smoked or chewed Tobacco  in the last 2 yrs please stop smoking, stop any regular Alcohol  and or any Recreational drug use.  Wear Seat belts while driving.  Do not drive if taking any narcotic, mind altering or controlled substances or recreational drugs or alcohol.

## 2024-10-17 NOTE — Progress Notes (Addendum)
 Pt receives out-pt hd at [facility], TTS, 0530am chair time, will continue to assist as needed.   [facility] [phone]  Addendum 1050 am D/c orders noted. Contacted clinic to inform of pt d/c and anticipated arrival back tomorrow. Have faxed d/c summary and last neph note at this time. No further support needed.

## 2024-10-17 NOTE — Progress Notes (Addendum)
 Patient ID: DALLAN SCHONBERG, male   DOB: 08/10/1946, 78 y.o.   MRN: [id number] S: Feels well, no new complaints O:BP (!) 165/58   Pulse 60   Temp 99.4 F (37.4 C) (Oral)   Resp 14   Ht 5' 11 (1.803 m)   Wt 79.6 kg   SpO2 96%   BMI 24.48 kg/m   Intake/Output Summary (Last 24 hours) at 10/17/2024 0933 Last data filed at 10/17/2024 0830 Gross per 24 hour  Intake 1542 ml  Output --  Net 1542 ml   Intake/Output: I/O last 3 completed shifts: In: 1242 [P.O.:240; I.V.:400; Blood:602] Out: -   Intake/Output this shift:  Total I/O In: 300 [P.O.:300] Out: -  Weight change:  Gen: NAD CVS: RRR Resp:CTA Abd: +BS, soft, NT/ND Ext: no edema, LUE AVG +T/B  Recent Labs  Lab 10/15/24 0228 10/16/24 0451 10/17/24 0424  NA 140 139 138  K 3.6 4.0 4.1  CL 98 100 100  CO2 32 29 26  GLUCOSE 145* 120* 89  BUN 36* 48* 58*  CREATININE 6.81* 8.85* 10.30*  ALBUMIN  4.1 3.4* 3.3*  CALCIUM  7.8* 6.6* 6.3*  PHOS  --  4.3 5.4*  AST 15  --   --   ALT <5  --   --    Liver Function Tests: Recent Labs  Lab 10/15/24 0228 10/16/24 0451 10/17/24 0424  AST 15  --   --   ALT <5  --   --   ALKPHOS 40  --   --   BILITOT 0.3  --   --   PROT 7.8  --   --   ALBUMIN  4.1 3.4* 3.3*   No results for input(s): LIPASE, AMYLASE in the last 168 hours. No results for input(s): AMMONIA in the last 168 hours. CBC: Recent Labs  Lab 10/15/24 0228 10/15/24 0639 10/16/24 0451 10/16/24 1855 10/17/24 0424  WBC 6.6 8.5 6.5 6.8 6.3  HGB 9.5* 8.4* 6.0* 8.6* 8.0*  HCT 30.1* 26.5* 18.8* 25.6* 24.3*  MCV 82.9 83.3 82.1 83.1 82.7  PLT 231 219 158 178 171   Cardiac Enzymes: No results for input(s): CKTOTAL, CKMB, CKMBINDEX, TROPONINI in the last 168 hours. CBG: Recent Labs  Lab 10/15/24 1930 10/16/24 0748 10/16/24 1651 10/16/24 2059 10/17/24 0720  GLUCAP 123* 128* 104* 164* 107*    Iron  Studies:  Recent Labs    10/15/24 0228  IRON  60  TIBC 200*  FERRITIN 1,082*    Studies/Results: No results found.  amLODipine   10 mg Oral Daily   calcium  carbonate  1 tablet Oral BID   carvedilol   12.5 mg Oral BID WC   hydrALAZINE   25 mg Oral BID   insulin  aspart  0-6 Units Subcutaneous TID WC   pantoprazole   40 mg Oral Daily    BMET    Component Value Date/Time   NA 138 10/17/2024 0424   K 4.1 10/17/2024 0424   CL 100 10/17/2024 0424   CO2 26 10/17/2024 0424   GLUCOSE 89 10/17/2024 0424   BUN 58 (H) 10/17/2024 0424   CREATININE 10.30 (H) 10/17/2024 0424   CALCIUM  6.3 (LL) 10/17/2024 0424   GFRNONAA 5 (L) 10/17/2024 0424   GFRAA 11 (L) 04/10/2020 0637   CBC    Component Value Date/Time   WBC 6.3 10/17/2024 0424   RBC 2.94 (L) 10/17/2024 0424   HGB 8.0 (L) 10/17/2024 0424   HCT 24.3 (L) 10/17/2024 0424   PLT 171 10/17/2024 0424   MCV 82.7 10/17/2024  0424   MCH 27.2 10/17/2024 0424   MCHC 32.9 10/17/2024 0424   RDW 15.2 10/17/2024 0424   LYMPHSABS 1.5 01/01/2023 0425   MONOABS 0.8 01/01/2023 0425   EOSABS 0.2 01/01/2023 0425   BASOSABS 0.1 01/01/2023 0425    Dialysis Orders: Center: [facility]  on TTS . EDW 77.5 kg (post weight 79kg) HD Bath 2K/3Ca  Time 3:45 Heparin  1000 units bolus then 1000 units/hr. Access LUE AVG BFR 400 DFR 500    Micera 50 every 2 weeks (given 10/14/24) Venofer 50 mg IV weekly   Assessment/Plan:  ABLA with melena - GI consulted and had colonoscopy 10/16/24 which showed recent bleeding from diverticulosis and s/p clipping.  EGD with gastritis and biopsies taken.  Hgb stable.  ESRD -  normally on TTS schedule.  He does not want HD yesterday as he was npo and had bowel prep.  We discussed the risk of waiting until Saturday, however he is not interested in HD today.  Will plan for HD tomorrow if he remains an inpatient, otherwise can go to outpatient HD unit for regular HD but without heparin .     Hypertension/volume  -  stable  Anemia  -  as above, just received Micera on 10/14/24 and his Hgb was 10.5 at that  time  Metabolic bone disease - continue with home meds once able to take po  Hypocalcemia - he declined IV calcium  but will take po TUMS.  Will use added Ca bath with HD (as per his outpatient prescription)  Nutrition - npo for now per GI Disposition - possible discharge today per primary svc.  Fairy RONAL Sellar, MD [facility]

## 2024-10-17 NOTE — Progress Notes (Signed)
 Per patient he is not taking calcium  infusion here stating he will take it at dialysis tomorrow and they will check all of his lab work. Patient stated he is ready to go home. Dr. Vicci made aware of patients refusal of calcium  infusion no new orders obtained at this time. Patient educated on risks of not taking calcium  infusion and also educated on the benefits the medication offers.

## 2024-10-18 LAB — SURGICAL PATHOLOGY

## 2024-10-19 NOTE — Anesthesia Postprocedure Evaluation (Signed)
"   Anesthesia Post Note  Patient: Omar Parker  Procedure(s) Performed: COLONOSCOPY EGD (ESOPHAGOGASTRODUODENOSCOPY)  Patient location during evaluation: Phase II Anesthesia Type: MAC Level of consciousness: awake Pain management: pain level controlled Vital Signs Assessment: post-procedure vital signs reviewed and stable Respiratory status: spontaneous breathing and respiratory function stable Cardiovascular status: blood pressure returned to baseline and stable Postop Assessment: no headache and no apparent nausea or vomiting Anesthetic complications: no Comments: Late entry   No notable events documented.   Last Vitals:  Vitals:   10/17/24 0734 10/17/24 0847  BP:  (!) 165/58  Pulse: 60   Resp:    Temp:    SpO2:      Last Pain:  Vitals:   10/17/24 0737  TempSrc:   PainSc: 0-No pain                 Yvonna PARAS Narely Nobles      "

## 2024-10-20 ENCOUNTER — Ambulatory Visit (INDEPENDENT_AMBULATORY_CARE_PROVIDER_SITE_OTHER): Payer: Self-pay | Admitting: Gastroenterology

## 2024-10-21 NOTE — Progress Notes (Signed)
 Patient result letter mailed

## 2024-11-26 ENCOUNTER — Ambulatory Visit (INDEPENDENT_AMBULATORY_CARE_PROVIDER_SITE_OTHER): Admitting: Gastroenterology
